# Patient Record
Sex: Female | Born: 1955 | Race: White | Hispanic: No | Marital: Married | Smoking: Current every day smoker
Health system: Southern US, Community
[De-identification: ages and names within clinical notes are randomized; demographics above are authoritative.]

## PROBLEM LIST (undated history)

## (undated) DIAGNOSIS — Z960 Presence of urogenital implants: Secondary | ICD-10-CM

## (undated) DIAGNOSIS — R0602 Shortness of breath: Secondary | ICD-10-CM

## (undated) DIAGNOSIS — J449 Chronic obstructive pulmonary disease, unspecified: Secondary | ICD-10-CM

## (undated) DIAGNOSIS — R0609 Other forms of dyspnea: Secondary | ICD-10-CM

## (undated) DIAGNOSIS — I10 Essential (primary) hypertension: Secondary | ICD-10-CM

## (undated) DIAGNOSIS — Z720 Tobacco use: Secondary | ICD-10-CM

## (undated) DIAGNOSIS — R0600 Dyspnea, unspecified: Secondary | ICD-10-CM

## (undated) DIAGNOSIS — R079 Chest pain, unspecified: Secondary | ICD-10-CM

## (undated) DIAGNOSIS — E119 Type 2 diabetes mellitus without complications: Secondary | ICD-10-CM

## (undated) HISTORY — DX: Tobacco use: Z72.0

## (undated) HISTORY — DX: Type 2 diabetes mellitus without complications: E11.9

## (undated) HISTORY — DX: Other forms of dyspnea: R06.09

## (undated) HISTORY — DX: Chest pain, unspecified: R07.9

## (undated) HISTORY — DX: Dyspnea, unspecified: R06.00

## (undated) HISTORY — DX: Shortness of breath: R06.02

---

## 1985-10-18 HISTORY — PX: PARTIAL HYSTERECTOMY: SHX80

## 1987-10-19 HISTORY — PX: TOTAL ABDOMINAL HYSTERECTOMY: SHX209

## 1999-09-08 ENCOUNTER — Encounter: Admission: RE | Admit: 1999-09-08 | Discharge: 1999-09-21 | Payer: Self-pay | Admitting: Family Medicine

## 2003-04-05 ENCOUNTER — Other Ambulatory Visit: Admission: RE | Admit: 2003-04-05 | Discharge: 2003-04-05 | Payer: Self-pay | Admitting: Family Medicine

## 2004-01-13 ENCOUNTER — Ambulatory Visit (HOSPITAL_COMMUNITY): Admission: RE | Admit: 2004-01-13 | Discharge: 2004-01-13 | Payer: Self-pay | Admitting: Gastroenterology

## 2004-01-13 ENCOUNTER — Encounter (INDEPENDENT_AMBULATORY_CARE_PROVIDER_SITE_OTHER): Payer: Self-pay | Admitting: *Deleted

## 2006-02-01 ENCOUNTER — Other Ambulatory Visit: Admission: RE | Admit: 2006-02-01 | Discharge: 2006-02-01 | Payer: Self-pay | Admitting: Obstetrics and Gynecology

## 2008-03-23 ENCOUNTER — Emergency Department (HOSPITAL_COMMUNITY): Admission: EM | Admit: 2008-03-23 | Discharge: 2008-03-23 | Payer: Self-pay | Admitting: Emergency Medicine

## 2011-10-20 ENCOUNTER — Emergency Department (HOSPITAL_COMMUNITY)
Admission: EM | Admit: 2011-10-20 | Discharge: 2011-10-20 | Disposition: A | Discharge status: Home / Self Care | Payer: BC Managed Care – PPO | Attending: Emergency Medicine | Admitting: Emergency Medicine

## 2011-10-20 ENCOUNTER — Encounter: Payer: Self-pay | Admitting: Emergency Medicine

## 2011-10-20 ENCOUNTER — Emergency Department (HOSPITAL_COMMUNITY): Payer: BC Managed Care – PPO

## 2011-10-20 ENCOUNTER — Other Ambulatory Visit: Payer: Self-pay

## 2011-10-20 DIAGNOSIS — R059 Cough, unspecified: Secondary | ICD-10-CM | POA: Insufficient documentation

## 2011-10-20 DIAGNOSIS — J069 Acute upper respiratory infection, unspecified: Secondary | ICD-10-CM | POA: Insufficient documentation

## 2011-10-20 DIAGNOSIS — R0602 Shortness of breath: Secondary | ICD-10-CM | POA: Insufficient documentation

## 2011-10-20 DIAGNOSIS — R05 Cough: Secondary | ICD-10-CM | POA: Insufficient documentation

## 2011-10-20 DIAGNOSIS — R413 Other amnesia: Secondary | ICD-10-CM

## 2011-10-20 DIAGNOSIS — R42 Dizziness and giddiness: Secondary | ICD-10-CM | POA: Insufficient documentation

## 2011-10-20 HISTORY — DX: Essential (primary) hypertension: I10

## 2011-10-20 HISTORY — DX: Chronic obstructive pulmonary disease, unspecified: J44.9

## 2011-10-20 HISTORY — DX: Presence of urogenital implants: Z96.0

## 2011-10-20 LAB — COMPREHENSIVE METABOLIC PANEL
Alkaline Phosphatase: 97 U/L (ref 39–117)
BUN: 8 mg/dL (ref 6–23)
CO2: 25 mEq/L (ref 19–32)
Chloride: 100 mEq/L (ref 96–112)
Creatinine, Ser: 0.51 mg/dL (ref 0.50–1.10)
GFR calc non Af Amer: >90 mL/min (ref >90)
Potassium: 4.3 mEq/L (ref 3.5–5.1)
Total Bilirubin: 0.1 mg/dL — ABNORMAL LOW (ref 0.3–1.2)

## 2011-10-20 LAB — URINALYSIS, ROUTINE W REFLEX MICROSCOPIC
Glucose, UA: NEGATIVE mg/dL
Hgb urine dipstick: NEGATIVE
Ketones, ur: NEGATIVE mg/dL
Protein, ur: NEGATIVE mg/dL
Urobilinogen, UA: 0.2 mg/dL (ref 0.0–1.0)

## 2011-10-20 LAB — CBC
HCT: 40.2 % (ref 36.0–46.0)
Hemoglobin: 13.8 g/dL (ref 12.0–15.0)
MCV: 90.1 fL (ref 78.0–100.0)
RBC: 4.46 MIL/uL (ref 3.87–5.11)
WBC: 11.8 10*3/uL — ABNORMAL HIGH (ref 4.0–10.5)

## 2011-10-20 LAB — DIFFERENTIAL
Basophils Relative: 0 % (ref 0–1)
Eosinophils Relative: 1 % (ref 0–5)
Lymphocytes Relative: 29 % (ref 12–46)
Lymphs Abs: 3.4 10*3/uL (ref 0.7–4.0)
Monocytes Relative: 1 % — ABNORMAL LOW (ref 3–12)
Neutro Abs: 8.2 10*3/uL — ABNORMAL HIGH (ref 1.7–7.7)

## 2011-10-20 MED ORDER — AZITHROMYCIN 250 MG PO TABS: 250.0000 mg | ORAL_TABLET | Freq: Every day | ORAL | Status: AC

## 2011-10-20 MED ORDER — ONDANSETRON HCL 4 MG/2ML IJ SOLN
4.0000 mg | Freq: Once | INTRAMUSCULAR | Status: AC
  Administered 2011-10-20: 4 mg via INTRAVENOUS
  Filled 2011-10-20: qty 2

## 2011-10-20 MED ORDER — IOHEXOL 300 MG/ML  SOLN: 100.0000 mL | Freq: Once | INTRAMUSCULAR | Status: DC | PRN

## 2011-10-20 MED ORDER — IPRATROPIUM BROMIDE 0.02 % IN SOLN
0.5000 mg | RESPIRATORY_TRACT | Status: DC
  Administered 2011-10-20: 0.5 mg via RESPIRATORY_TRACT
  Filled 2011-10-20: qty 2.5

## 2011-10-20 MED ORDER — ALBUTEROL SULFATE (5 MG/ML) 0.5% IN NEBU
2.5000 mg | INHALATION_SOLUTION | RESPIRATORY_TRACT | Status: DC
  Administered 2011-10-20: 2.5 mg via RESPIRATORY_TRACT
  Filled 2011-10-20: qty 0.5

## 2011-10-20 NOTE — ED Notes (Signed)
Per ems, the patient starting having signs of flu on christmas day ( cough, congestion, headache, nausea)

## 2011-10-20 NOTE — ED Notes (Signed)
[id number]<JY> Expected date:<BR> Expected time:<BR> Means of arrival:<BR> Comments:<BR> EMS/flu like symptoms

## 2011-10-20 NOTE — ED Notes (Signed)
Pt given discharge info and rx

## 2011-10-20 NOTE — ED Notes (Signed)
cbg 174,   zofran 4mg  ivp,   20 Right forearm.

## 2011-10-20 NOTE — ED Provider Notes (Signed)
History     CSN: [id number]  Arrival date & time 10/20/11  4098   First MD Initiated Contact with Patient 10/20/11 0703      Chief Complaint  Patient presents with  . Influenza    (Consider location/radiation/quality/duration/timing/severity/associated sxs/prior treatment) HPI Comments: Patient tells me she has sick with the above complaints for the past 1 1/2 weeks.  This morning she went to work.  After arriving there, she had no idea how she had gotten there, and had no recollection of waking up and getting ready this morning.  911 was called and the patient brought here.  She denies any headache, weakness, but does tell me at one point this morning her legs felt numb.    Per her husband, she gets "plum stupid" when she has a fever.    Patient is a 56 y.o. female presenting with flu symptoms. The history is provided by the patient.  Influenza This is a new problem. The current episode started more than 1 week ago. The problem occurs constantly. The problem has been gradually worsening. Pertinent negatives include no chest pain, no headaches and no shortness of breath. The symptoms are aggravated by nothing. The symptoms are relieved by nothing. Treatments tried: cough syrup.    Past Medical History  Diagnosis Date  . Ureteral stent retained   . Hypertension   . Diabetes mellitus   . COPD (chronic obstructive pulmonary disease)     History reviewed. No pertinent past surgical history.  History reviewed. No pertinent family history.  History  Substance Use Topics  . Smoking status: Current Everyday Smoker  . Smokeless tobacco: Not on file  . Alcohol Use: No    OB History    Grav Para Term Preterm Abortions TAB SAB Ect Mult Living                  Review of Systems  Respiratory: Negative for shortness of breath.   Cardiovascular: Negative for chest pain.  Neurological: Negative for headaches.  All other systems reviewed and are negative.    Allergies  Codeine;  Penicillins; and Sulfa antibiotics  Home Medications   Current Outpatient Rx  Name Route Sig Dispense Refill  . ESTROGENS CONJUGATED 1.25 MG PO TABS Oral Take 1.25 mg by mouth daily.        BP 188/88  Pulse 67  Temp(Src) 97.7 F (36.5 C) (Oral)  Resp 18  SpO2 96%  Physical Exam  Nursing note and vitals reviewed. Constitutional: She is oriented to person, place, and time. She appears well-developed and well-nourished. No distress.  HENT:  Head: Normocephalic and atraumatic.  Neck: Normal range of motion. Neck supple.  Cardiovascular: Normal rate and regular rhythm.  Exam reveals no gallop and no friction rub.   No murmur heard. Pulmonary/Chest: Effort normal and breath sounds normal. No respiratory distress. She has no wheezes.  Abdominal: Soft. Bowel sounds are normal. She exhibits no distension. There is no tenderness.  Musculoskeletal: Normal range of motion.  Neurological: She is alert and oriented to person, place, and time.  Skin: Skin is warm and dry. She is not diaphoretic.    ED Course  Procedures (including critical care time)   Labs Reviewed  CBC  DIFFERENTIAL  COMPREHENSIVE METABOLIC PANEL  URINALYSIS, ROUTINE W REFLEX MICROSCOPIC   No results found.   No diagnosis found.    MDM  The patient complained of memory loss.  Per husband, she behaves strangely when she is ill and has also  been taking a lot of cough syrup.  There is nothing turning up on the workup and will discharge with an antibiotic as she has been ill for one week.          Geoffery Lyons, MD 10/20/11 [phone]

## 2011-10-20 NOTE — ED Notes (Signed)
Respiratory paged for duoneb order.  Therapist stated she was unable to do it at this time.

## 2011-10-20 NOTE — ED Notes (Signed)
Patient transported to X-ray 

## 2011-10-20 NOTE — ED Notes (Signed)
Pt states nausea has improved.

## 2012-08-31 ENCOUNTER — Other Ambulatory Visit (HOSPITAL_COMMUNITY): Payer: Self-pay | Admitting: Cardiovascular Disease

## 2012-08-31 DIAGNOSIS — I10 Essential (primary) hypertension: Secondary | ICD-10-CM

## 2012-09-01 ENCOUNTER — Other Ambulatory Visit: Payer: Self-pay | Admitting: Internal Medicine

## 2012-10-02 ENCOUNTER — Inpatient Hospital Stay (HOSPITAL_COMMUNITY): Admission: RE | Admit: 2012-10-02 | Payer: BC Managed Care – PPO | Source: Ambulatory Visit

## 2012-10-22 ENCOUNTER — Inpatient Hospital Stay (HOSPITAL_BASED_OUTPATIENT_CLINIC_OR_DEPARTMENT_OTHER)
Admission: EM | Admit: 2012-10-22 | Discharge: 2012-10-24 | DRG: 541 | Disposition: A | Discharge status: Home / Self Care | Payer: BC Managed Care – PPO | Attending: Internal Medicine | Admitting: Internal Medicine

## 2012-10-22 ENCOUNTER — Emergency Department (HOSPITAL_BASED_OUTPATIENT_CLINIC_OR_DEPARTMENT_OTHER): Payer: BC Managed Care – PPO

## 2012-10-22 ENCOUNTER — Encounter (HOSPITAL_BASED_OUTPATIENT_CLINIC_OR_DEPARTMENT_OTHER): Payer: Self-pay | Admitting: *Deleted

## 2012-10-22 DIAGNOSIS — I10 Essential (primary) hypertension: Secondary | ICD-10-CM

## 2012-10-22 DIAGNOSIS — J441 Chronic obstructive pulmonary disease with (acute) exacerbation: Principal | ICD-10-CM

## 2012-10-22 DIAGNOSIS — F17200 Nicotine dependence, unspecified, uncomplicated: Secondary | ICD-10-CM | POA: Diagnosis present

## 2012-10-22 DIAGNOSIS — E876 Hypokalemia: Secondary | ICD-10-CM

## 2012-10-22 DIAGNOSIS — Z23 Encounter for immunization: Secondary | ICD-10-CM

## 2012-10-22 DIAGNOSIS — Z720 Tobacco use: Secondary | ICD-10-CM | POA: Diagnosis present

## 2012-10-22 DIAGNOSIS — Z79899 Other long term (current) drug therapy: Secondary | ICD-10-CM

## 2012-10-22 DIAGNOSIS — Z885 Allergy status to narcotic agent status: Secondary | ICD-10-CM

## 2012-10-22 DIAGNOSIS — J449 Chronic obstructive pulmonary disease, unspecified: Secondary | ICD-10-CM

## 2012-10-22 DIAGNOSIS — Z882 Allergy status to sulfonamides status: Secondary | ICD-10-CM

## 2012-10-22 DIAGNOSIS — J9600 Acute respiratory failure, unspecified whether with hypoxia or hypercapnia: Secondary | ICD-10-CM | POA: Diagnosis present

## 2012-10-22 DIAGNOSIS — E119 Type 2 diabetes mellitus without complications: Secondary | ICD-10-CM | POA: Diagnosis present

## 2012-10-22 DIAGNOSIS — Z880 Allergy status to penicillin: Secondary | ICD-10-CM

## 2012-10-22 DIAGNOSIS — E785 Hyperlipidemia, unspecified: Secondary | ICD-10-CM

## 2012-10-22 LAB — CBC WITH DIFFERENTIAL/PLATELET
Basophils Absolute: 0 10*3/uL (ref 0.0–0.1)
Basophils Relative: 0 % (ref 0–1)
Eosinophils Absolute: 0.1 10*3/uL (ref 0.0–0.7)
Eosinophils Relative: 1 % (ref 0–5)
HCT: 44.3 % (ref 36.0–46.0)
Lymphocytes Relative: 50 % — ABNORMAL HIGH (ref 12–46)
MCHC: 33.9 g/dL (ref 30.0–36.0)
MCV: 91.3 fL (ref 78.0–100.0)
Monocytes Absolute: 0.5 10*3/uL (ref 0.1–1.0)
Platelets: 301 10*3/uL (ref 150–400)
RDW: 13.2 % (ref 11.5–15.5)
WBC: 8.6 10*3/uL (ref 4.0–10.5)

## 2012-10-22 LAB — BASIC METABOLIC PANEL
BUN: 10 mg/dL (ref 6–23)
Calcium: 9.2 mg/dL (ref 8.4–10.5)
Creatinine, Ser: 0.6 mg/dL (ref 0.50–1.10)
GFR calc Af Amer: >90 mL/min (ref >90)
GFR calc non Af Amer: >90 mL/min (ref >90)

## 2012-10-22 LAB — INFLUENZA PANEL BY PCR (TYPE A & B): Influenza A By PCR: NEGATIVE

## 2012-10-22 MED ORDER — ENOXAPARIN SODIUM 40 MG/0.4ML ~~LOC~~ SOLN
40.0000 mg | SUBCUTANEOUS | Status: DC
  Administered 2012-10-22 – 2012-10-23 (×2): 40 mg via SUBCUTANEOUS
  Filled 2012-10-22 (×3): qty 0.4

## 2012-10-22 MED ORDER — LISINOPRIL 5 MG PO TABS
5.0000 mg | ORAL_TABLET | Freq: Every day | ORAL | Status: DC
  Administered 2012-10-22 – 2012-10-24 (×3): 5 mg via ORAL
  Filled 2012-10-22 (×3): qty 1

## 2012-10-22 MED ORDER — GUAIFENESIN ER 600 MG PO TB12
600.0000 mg | ORAL_TABLET | Freq: Two times a day (BID) | ORAL | Status: DC
  Administered 2012-10-22 – 2012-10-24 (×5): 600 mg via ORAL
  Filled 2012-10-22 (×6): qty 1

## 2012-10-22 MED ORDER — PREDNISONE 50 MG PO TABS
60.0000 mg | ORAL_TABLET | Freq: Once | ORAL | Status: AC
  Administered 2012-10-22: 60 mg via ORAL
  Filled 2012-10-22: qty 1

## 2012-10-22 MED ORDER — ALBUTEROL SULFATE (5 MG/ML) 0.5% IN NEBU
5.0000 mg | INHALATION_SOLUTION | Freq: Once | RESPIRATORY_TRACT | Status: AC
  Administered 2012-10-22: 5 mg via RESPIRATORY_TRACT
  Filled 2012-10-22: qty 0.5

## 2012-10-22 MED ORDER — ALBUTEROL SULFATE (5 MG/ML) 0.5% IN NEBU: 2.5000 mg | INHALATION_SOLUTION | RESPIRATORY_TRACT | Status: DC

## 2012-10-22 MED ORDER — IBUPROFEN 200 MG PO TABS
200.0000 mg | ORAL_TABLET | Freq: Four times a day (QID) | ORAL | Status: DC | PRN
  Filled 2012-10-22: qty 1

## 2012-10-22 MED ORDER — GUAIFENESIN-DM 100-10 MG/5ML PO SYRP
5.0000 mL | ORAL_SOLUTION | ORAL | Status: DC | PRN
  Filled 2012-10-22: qty 5

## 2012-10-22 MED ORDER — POTASSIUM CHLORIDE CRYS ER 20 MEQ PO TBCR
20.0000 meq | EXTENDED_RELEASE_TABLET | Freq: Once | ORAL | Status: AC
  Administered 2012-10-22: 20 meq via ORAL
  Filled 2012-10-22: qty 1

## 2012-10-22 MED ORDER — SODIUM CHLORIDE 0.9 % IV SOLN: INTRAVENOUS | Status: DC

## 2012-10-22 MED ORDER — INSULIN ASPART 100 UNIT/ML ~~LOC~~ SOLN
0.0000 [IU] | Freq: Three times a day (TID) | SUBCUTANEOUS | Status: DC
  Administered 2012-10-23 (×2): 2 [IU] via SUBCUTANEOUS
  Administered 2012-10-23: 3 [IU] via SUBCUTANEOUS

## 2012-10-22 MED ORDER — NICOTINE 21 MG/24HR TD PT24
21.0000 mg | MEDICATED_PATCH | Freq: Every day | TRANSDERMAL | Status: DC
  Administered 2012-10-22 – 2012-10-24 (×3): 21 mg via TRANSDERMAL
  Filled 2012-10-22 (×3): qty 1

## 2012-10-22 MED ORDER — SODIUM CHLORIDE 0.9 % IV BOLUS (SEPSIS)
500.0000 mL | Freq: Once | INTRAVENOUS | Status: AC
  Administered 2012-10-22: 500 mL via INTRAVENOUS

## 2012-10-22 MED ORDER — INSULIN ASPART 100 UNIT/ML ~~LOC~~ SOLN
0.0000 [IU] | Freq: Every day | SUBCUTANEOUS | Status: DC
  Administered 2012-10-22: 4 [IU] via SUBCUTANEOUS
  Administered 2012-10-23: 2 [IU] via SUBCUTANEOUS

## 2012-10-22 MED ORDER — PREDNISONE 20 MG PO TABS
40.0000 mg | ORAL_TABLET | Freq: Every day | ORAL | Status: DC
  Administered 2012-10-23 – 2012-10-24 (×2): 40 mg via ORAL
  Filled 2012-10-22 (×3): qty 2

## 2012-10-22 MED ORDER — IPRATROPIUM BROMIDE 0.02 % IN SOLN
RESPIRATORY_TRACT | Status: AC
  Filled 2012-10-22: qty 2.5

## 2012-10-22 MED ORDER — ATORVASTATIN CALCIUM 20 MG PO TABS
20.0000 mg | ORAL_TABLET | Freq: Every day | ORAL | Status: DC
  Administered 2012-10-22 – 2012-10-24 (×3): 20 mg via ORAL
  Filled 2012-10-22 (×4): qty 1

## 2012-10-22 MED ORDER — IPRATROPIUM BROMIDE 0.02 % IN SOLN
0.5000 mg | Freq: Four times a day (QID) | RESPIRATORY_TRACT | Status: DC
  Administered 2012-10-22 – 2012-10-24 (×8): 0.5 mg via RESPIRATORY_TRACT
  Filled 2012-10-22 (×8): qty 2.5

## 2012-10-22 MED ORDER — LEVOFLOXACIN 500 MG PO TABS
500.0000 mg | ORAL_TABLET | Freq: Once | ORAL | Status: AC
  Administered 2012-10-22: 500 mg via ORAL
  Filled 2012-10-22: qty 1

## 2012-10-22 MED ORDER — ALBUTEROL SULFATE (5 MG/ML) 0.5% IN NEBU
INHALATION_SOLUTION | RESPIRATORY_TRACT | Status: AC
  Administered 2012-10-22: 5 mg via RESPIRATORY_TRACT
  Filled 2012-10-22: qty 1

## 2012-10-22 MED ORDER — IPRATROPIUM BROMIDE 0.02 % IN SOLN
0.5000 mg | RESPIRATORY_TRACT | Status: DC
  Administered 2012-10-22: 0.5 mg via RESPIRATORY_TRACT

## 2012-10-22 MED ORDER — ALBUTEROL SULFATE (5 MG/ML) 0.5% IN NEBU
2.5000 mg | INHALATION_SOLUTION | Freq: Four times a day (QID) | RESPIRATORY_TRACT | Status: DC
  Administered 2012-10-22 – 2012-10-24 (×8): 2.5 mg via RESPIRATORY_TRACT
  Filled 2012-10-22 (×8): qty 0.5

## 2012-10-22 MED ORDER — SODIUM CHLORIDE 0.9 % IV SOLN
INTRAVENOUS | Status: AC
  Administered 2012-10-22: 16:00:00 via INTRAVENOUS

## 2012-10-22 MED ORDER — ACETAMINOPHEN 650 MG RE SUPP: 650.0000 mg | Freq: Four times a day (QID) | RECTAL | Status: DC | PRN

## 2012-10-22 MED ORDER — OXYCODONE-ACETAMINOPHEN 5-325 MG PO TABS
2.0000 | ORAL_TABLET | Freq: Once | ORAL | Status: AC
  Administered 2012-10-22: 2 via ORAL
  Filled 2012-10-22 (×2): qty 2

## 2012-10-22 MED ORDER — ONDANSETRON HCL 4 MG/2ML IJ SOLN: 4.0000 mg | Freq: Four times a day (QID) | INTRAMUSCULAR | Status: DC | PRN

## 2012-10-22 MED ORDER — PNEUMOCOCCAL VAC POLYVALENT 25 MCG/0.5ML IJ INJ
0.5000 mL | INJECTION | INTRAMUSCULAR | Status: AC
  Administered 2012-10-24: 0.5 mL via INTRAMUSCULAR
  Filled 2012-10-22: qty 0.5

## 2012-10-22 MED ORDER — ONDANSETRON HCL 4 MG PO TABS: 4.0000 mg | ORAL_TABLET | Freq: Four times a day (QID) | ORAL | Status: DC | PRN

## 2012-10-22 MED ORDER — ALBUTEROL SULFATE (5 MG/ML) 0.5% IN NEBU: 2.5000 mg | INHALATION_SOLUTION | RESPIRATORY_TRACT | Status: DC | PRN

## 2012-10-22 MED ORDER — ESTROGENS CONJUGATED 1.25 MG PO TABS
1.2500 mg | ORAL_TABLET | Freq: Every day | ORAL | Status: DC
  Administered 2012-10-22 – 2012-10-24 (×3): 1.25 mg via ORAL
  Filled 2012-10-22 (×4): qty 1

## 2012-10-22 MED ORDER — LEVOFLOXACIN 750 MG PO TABS
750.0000 mg | ORAL_TABLET | Freq: Every day | ORAL | Status: DC
  Administered 2012-10-23 – 2012-10-24 (×2): 750 mg via ORAL
  Filled 2012-10-22 (×2): qty 1

## 2012-10-22 MED ORDER — ALBUTEROL SULFATE (5 MG/ML) 0.5% IN NEBU
2.5000 mg | INHALATION_SOLUTION | RESPIRATORY_TRACT | Status: DC
  Administered 2012-10-22: 5 mg via RESPIRATORY_TRACT

## 2012-10-22 MED ORDER — HYDROCODONE-ACETAMINOPHEN 5-325 MG PO TABS
0.5000 | ORAL_TABLET | Freq: Four times a day (QID) | ORAL | Status: DC | PRN
  Administered 2012-10-22 – 2012-10-24 (×5): 1 via ORAL
  Filled 2012-10-22 (×5): qty 1

## 2012-10-22 MED ORDER — ACETAMINOPHEN 325 MG PO TABS
650.0000 mg | ORAL_TABLET | Freq: Four times a day (QID) | ORAL | Status: DC | PRN
  Administered 2012-10-22: 650 mg via ORAL
  Filled 2012-10-22: qty 2

## 2012-10-22 NOTE — ED Notes (Signed)
EKG was done and shown to Dr. Fredderick Phenix. An old EKG was pulled from the [facility] system and also given to the doctor.

## 2012-10-22 NOTE — H&P (Addendum)
Patient's PCP: [facility]  Chief Complaint: Shortness of breath  History of Present Illness: Patricia Kaiser is a 57 y.o. Caucasian female with history of hypertension, diabetes? not on any diabetic medications, and COPD who presents with the above complaints.  Patient reported that on 10/15/2012 she had developed sinus congestion and fever.  She had presented to her primary care physician's office on 10/16/2012 for further evaluation and was given cough medications.  Since then her symptoms have worsened and she has been feeling increasingly short of breath with increasing wheezing.  She has also been having nauseated with dry heaving.  Her shortness of breath worsened since 10/18/2012.  She has been using inhaler and nebulizer without any relief.  As a result she presented to the emergency department for further evaluation.  She was given prednisone, neb treatments, and levofloxacin and the hospitalist service was asked to admit the patient for further care and management.  Patient does admit to having some fevers at home.  Complaining of some sinus congestion and drainage.  Denies any abdominal pain, but had diarrhea on 10/17/2012 and 10/18/2012 which since then she has not had any episode.  Has been having headaches due to lack of sleep.  Denies any vision changes.  Review of Systems: All systems reviewed with the patient and positive as per history of present illness, otherwise all other systems are negative.  Past Medical History  Diagnosis Date  . Ureteral stent retained   . Hypertension   . Diabetes mellitus   . COPD (chronic obstructive pulmonary disease)    History reviewed. No pertinent past surgical history. Family History  Problem Relation Age of Onset  . COPD Mother   . Lung cancer Mother   . COPD Father    History   Social History  . Marital Status: Divorced    Spouse Name: N/A    Number of Children: N/A  . Years of Education: N/A   Occupational  History  . Not on file.   Social History Main Topics  . Smoking status: Current Every Day Smoker  . Smokeless tobacco: Not on file  . Alcohol Use: No  . Drug Use: No  . Sexually Active: Yes   Other Topics Concern  . Not on file   Social History Narrative  . No narrative on file   Allergies: Codeine; Penicillins; and Sulfa antibiotics  Home Meds: Prior to Admission medications   Medication Sig Start Date End Date Taking? Authorizing Provider  albuterol (PROVENTIL HFA;VENTOLIN HFA) 108 (90 BASE) MCG/ACT inhaler Inhale 2 puffs into the lungs every 6 (six) hours as needed. For difficulty breathing   Yes Historical Provider, MD  atorvastatin (LIPITOR) 20 MG tablet Take 20 mg by mouth daily.  09/16/12  Yes Historical Provider, MD  co-enzyme Q-10 30 MG capsule Take 30 mg by mouth at bedtime.   Yes Historical Provider, MD  estrogens, conjugated, (PREMARIN) 1.25 MG tablet Take 1.25 mg by mouth daily.     Yes Historical Provider, MD  lisinopril (PRINIVIL,ZESTRIL) 5 MG tablet Take 5 mg by mouth daily.  09/16/12  Yes Historical Provider, MD    Physical Exam: Blood pressure 145/71, pulse 90, temperature 98.9 F (37.2 C), temperature source Oral, resp. rate 20, weight 53.071 kg (117 lb), SpO2 94.00%. General: Awake, Oriented x3, No acute distress. HEENT: EOMI, Moist mucous membranes Neck: Supple CV: S1 and S2 Lungs: Expiratory greater than inspiratory wheezing bilaterally, moderate air movement. Abdomen: Soft, Nontender, Nondistended, +bowel sounds. Ext: Good pulses.  Trace edema. No clubbing or cyanosis noted. Neuro: Cranial Nerves II-XII grossly intact. Has 5/5 motor strength in upper and lower extremities.  Lab results:  Basename 10/22/12 1030  NA 140  K 3.4*  CL 98  CO2 27  GLUCOSE 167*  BUN 10  CREATININE 0.60  CALCIUM 9.2  MG --  PHOS --   No results found for this basename: AST:2,ALT:2,ALKPHOS:2,BILITOT:2,PROT:2,ALBUMIN:2 in the last 72 hours No results found for this  basename: LIPASE:2,AMYLASE:2 in the last 72 hours  Basename 10/22/12 1030  WBC 8.6  NEUTROABS 3.7  HGB 15.0  HCT 44.3  MCV 91.3  PLT 301   No results found for this basename: CKTOTAL:3,CKMB:3,CKMBINDEX:3,TROPONINI:3 in the last 72 hours No components found with this basename: POCBNP:3 No results found for this basename: DDIMER in the last 72 hours No results found for this basename: HGBA1C:2 in the last 72 hours No results found for this basename: CHOL:2,HDL:2,LDLCALC:2,TRIG:2,CHOLHDL:2,LDLDIRECT:2 in the last 72 hours No results found for this basename: TSH,T4TOTAL,FREET3,T3FREE,THYROIDAB in the last 72 hours No results found for this basename: VITAMINB12:2,FOLATE:2,FERRITIN:2,TIBC:2,IRON:2,RETICCTPCT:2 in the last 72 hours Imaging results:  Dg Chest 2 View  10/22/2012  *RADIOLOGY REPORT*  Clinical Data: Wheezing with shortness of breath and cough.  CHEST - 2 VIEW  Comparison: 10/20/2011  Findings: Two views of the chest demonstrate clear lungs. Heart and mediastinum are within normal limits.  The trachea is midline. Bony thorax is intact. There is a stable small calcification at the right lung base.  IMPRESSION: Stable chest radiograph findings.  No acute cardiopulmonary disease.   Original Report Authenticated By: Richarda Overlie, M.D.    Other results: EKG: Normal sinus rhythm with heart rate of 78.  Assessment & Plan by Problem: Acute respiratory failure due to COPD exacerbation Suspect patient had upper viral respiratory infection which likely triggered her COPD exacerbation.  Continue prednisone 40 mg daily.  Continue levofloxacin for 4 more days to complete a five-day course.  Check influenza PCR, patient out of therapeutic window for Tamiflu, if patient's respiratory status worsens consider starting Tamiflu.  Continue neb treatments.  Tobacco use Patient counseled on cessation.  Nicotine patch prescribed.  Patient reports that she is motivated to quit smoking.  Hypertension Continue  home lisinopril.  Stable.  Hypokalemia Replace as needed.  Hyperlipidemia Continue statin.  Diabetes? Not on any diabetic medications.  Sensitive sliding scale insulin.  Check hemoglobin A1C.  Nausea and vomiting Likely due to recent upper respiratory viral infection.  Improved.  Continue to monitor.  Prophylaxis Lovenox.  CODE STATUS Full code.  Disposition Admit the patient as observation to med-surg.  Time spent on admission, talking to the patient, and coordinating care was: 60 mins.  Obdulio Mash A, MD 10/22/2012, 3:41 PM

## 2012-10-22 NOTE — ED Notes (Signed)
Patient states she has been running a fever, non-productive cough over the past week. She went to [facility] on Monday and given prescription for cough syrup.  Used albuterol inhaler last night

## 2012-10-22 NOTE — ED Provider Notes (Signed)
History     CSN: [id number]  Arrival date & time 10/22/12  0865   First MD Initiated Contact with Patient 10/22/12 [phone]      Chief Complaint  Patient presents with  . Shortness of Breath    (Consider location/radiation/quality/duration/timing/severity/associated sxs/prior treatment) HPI Comments: Patient presents with cough and shortness of breath. She has a history of COPD but does not regularly use an inhaler. Over the last week she's been having some coughing and cold symptoms. She's had low-grade fevers and a cough which is mostly nonproductive. She sore across her chest from coughing she says. She's had some increased shortness of breath and wheezing. She was seen by her primary care physician 6 days ago and was given prescription for some cough medicine and was told that she had upper respiratory infection. She's been using her albuterol inhaler at home and she also has a nebulizer machine which she uses once however she says the medicine she had port was old. She denies any recent vomiting or diarrhea. She states her cough and shortness of breath has gotten worse over the last week.  Patient is a 57 y.o. female presenting with shortness of breath.  Shortness of Breath  Associated symptoms include a fever, shortness of breath and wheezing. Pertinent negatives include no chest pain, no rhinorrhea and no cough.    Past Medical History  Diagnosis Date  . Ureteral stent retained   . Hypertension   . Diabetes mellitus   . COPD (chronic obstructive pulmonary disease)     No past surgical history on file.  No family history on file.  History  Substance Use Topics  . Smoking status: Current Every Day Smoker  . Smokeless tobacco: Not on file  . Alcohol Use: No    OB History    Grav Para Term Preterm Abortions TAB SAB Ect Mult Living                  Review of Systems  Constitutional: Positive for fever and fatigue. Negative for chills and diaphoresis.  HENT: Positive for  congestion. Negative for rhinorrhea and sneezing.   Eyes: Negative.   Respiratory: Positive for shortness of breath and wheezing. Negative for cough and chest tightness.   Cardiovascular: Negative for chest pain and leg swelling.  Gastrointestinal: Negative for nausea, vomiting, abdominal pain, diarrhea and blood in stool.  Genitourinary: Negative for frequency, hematuria, flank pain and difficulty urinating.  Musculoskeletal: Positive for myalgias. Negative for back pain and arthralgias.  Skin: Negative for rash.  Neurological: Negative for dizziness, speech difficulty, weakness, numbness and headaches.    Allergies  Codeine; Penicillins; and Sulfa antibiotics  Home Medications   Current Outpatient Rx  Name  Route  Sig  Dispense  Refill  . ALBUTEROL IN   Inhalation   Inhale into the lungs.         . ESTROGENS CONJUGATED 1.25 MG PO TABS   Oral   Take 1.25 mg by mouth daily.             BP 149/81  Pulse 84  Temp 98.4 F (36.9 C)  Resp 21  SpO2 91%  Physical Exam  Constitutional: She is oriented to person, place, and time. She appears well-developed and well-nourished.  HENT:  Head: Normocephalic and atraumatic.  Right Ear: External ear normal.  Left Ear: External ear normal.  Mouth/Throat: Oropharynx is clear and moist.  Eyes: Pupils are equal, round, and reactive to light.  Neck: Normal range of motion.  Neck supple.  Cardiovascular: Normal rate, regular rhythm and normal heart sounds.   Pulmonary/Chest: Effort normal. No respiratory distress. She has wheezes (bilateral expiratory wheezing. There is rhonchi bilaterally and crackles in the bases bilaterally). She has rales. She exhibits no tenderness.       She had some mild increased work of breathing and tachypnea.  Abdominal: Soft. Bowel sounds are normal. There is no tenderness. There is no rebound and no guarding.  Musculoskeletal: Normal range of motion. She exhibits no edema.  Lymphadenopathy:    She has no  cervical adenopathy.  Neurological: She is alert and oriented to person, place, and time.  Skin: Skin is warm and dry. No rash noted.  Psychiatric: She has a normal mood and affect.    ED Course  Procedures (including critical care time)  Results for orders placed during the hospital encounter of 10/22/12  CBC WITH DIFFERENTIAL      Component Value Range   WBC 8.6  4.0 - 10.5 K/uL   RBC 4.85  3.87 - 5.11 MIL/uL   Hemoglobin 15.0  12.0 - 15.0 g/dL   HCT 45.4  09.8 - 11.9 %   MCV 91.3  78.0 - 100.0 fL   MCH 30.9  26.0 - 34.0 pg   MCHC 33.9  30.0 - 36.0 g/dL   RDW 14.7  82.9 - 56.2 %   Platelets 301  150 - 400 K/uL   Neutrophils Relative 42 (*) 43 - 77 %   Neutro Abs 3.7  1.7 - 7.7 K/uL   Lymphocytes Relative 50 (*) 12 - 46 %   Lymphs Abs 4.3 (*) 0.7 - 4.0 K/uL   Monocytes Relative 6  3 - 12 %   Monocytes Absolute 0.5  0.1 - 1.0 K/uL   Eosinophils Relative 1  0 - 5 %   Eosinophils Absolute 0.1  0.0 - 0.7 K/uL   Basophils Relative 0  0 - 1 %   Basophils Absolute 0.0  0.0 - 0.1 K/uL   WBC Morphology ATYPICAL LYMPHOCYTES    BASIC METABOLIC PANEL      Component Value Range   Sodium 140  135 - 145 mEq/L   Potassium 3.4 (*) 3.5 - 5.1 mEq/L   Chloride 98  96 - 112 mEq/L   CO2 27  19 - 32 mEq/L   Glucose, Bld 167 (*) 70 - 99 mg/dL   BUN 10  6 - 23 mg/dL   Creatinine, Ser 1.30  0.50 - 1.10 mg/dL   Calcium 9.2  8.4 - 86.5 mg/dL   GFR calc non Af Amer >90  >90 mL/min   GFR calc Af Amer >90  >90 mL/min   Dg Chest 2 View  10/22/2012  *RADIOLOGY REPORT*  Clinical Data: Wheezing with shortness of breath and cough.  CHEST - 2 VIEW  Comparison: 10/20/2011  Findings: Two views of the chest demonstrate clear lungs. Heart and mediastinum are within normal limits.  The trachea is midline. Bony thorax is intact. There is a stable small calcification at the right lung base.  IMPRESSION: Stable chest radiograph findings.  No acute cardiopulmonary disease.   Original Report Authenticated By: Richarda Overlie, M.D.       Date: 10/22/2012  Rate: 78  Rhythm: normal sinus rhythm  QRS Axis: normal  Intervals: normal  ST/T Wave abnormalities: normal  Conduction Disutrbances:none  Narrative Interpretation:   Old EKG Reviewed: unchanged   1. COPD (chronic obstructive pulmonary disease)  MDM  Patient history of COPD with worsening cough and congestion and wheezing over the last week. She's had some low-grade fevers at home. She's afebrile here. She was given a duo neb and albuterol neb here with some improvement of symptoms but she still wheezy with diminished breath sounds and still has an oxygen saturation of 91-92% on a nasal cannula. She did not have home oxygen. There is no evidence of pneumonia on the chest x-ray but given the symptoms have been going on over a week I did go ahead and start Levaquin for bronchitis. I will also send a flu swab. I will go ahead and consult the hospitalist at [facility] for admission.        Rolan Bucco, MD 10/22/12 1200

## 2012-10-22 NOTE — ED Notes (Signed)
Reported bed 5N29-c to [facility]--via Patricia Kaiser

## 2012-10-23 DIAGNOSIS — J449 Chronic obstructive pulmonary disease, unspecified: Secondary | ICD-10-CM

## 2012-10-23 DIAGNOSIS — J4489 Other specified chronic obstructive pulmonary disease: Secondary | ICD-10-CM

## 2012-10-23 LAB — GLUCOSE, CAPILLARY
Glucose-Capillary: 323 mg/dL — ABNORMAL HIGH (ref 70–99)
Glucose-Capillary: 171 mg/dL — ABNORMAL HIGH (ref 70–99)
Glucose-Capillary: 288 mg/dL — ABNORMAL HIGH (ref 70–99)

## 2012-10-23 LAB — BASIC METABOLIC PANEL
BUN: 8 mg/dL (ref 6–23)
Calcium: 8.9 mg/dL (ref 8.4–10.5)
GFR calc non Af Amer: >90 mL/min (ref >90)
Glucose, Bld: 251 mg/dL — ABNORMAL HIGH (ref 70–99)

## 2012-10-23 LAB — CBC
MCH: 30.4 pg (ref 26.0–34.0)
MCHC: 33.9 g/dL (ref 30.0–36.0)
Platelets: 258 10*3/uL (ref 150–400)

## 2012-10-23 LAB — TROPONIN I: Troponin I: <0.3 ng/mL (ref <0.30)

## 2012-10-23 MED ORDER — GLIPIZIDE 5 MG PO TABS
5.0000 mg | ORAL_TABLET | Freq: Two times a day (BID) | ORAL | Status: DC
  Administered 2012-10-23 – 2012-10-24 (×3): 5 mg via ORAL
  Filled 2012-10-23 (×5): qty 1

## 2012-10-23 NOTE — Progress Notes (Signed)
Pt ambulated approximately 200 feet while on Room Air. Sats were between 94-97%. O2 dropped to 88% at one time during ambulation, but patient was able to bring the sats to 94%  without supplemental oxygen. After ambulation patient at rest was at 94% room air and stated she thought the neb treatment had helped and she was feeling much better.

## 2012-10-23 NOTE — Progress Notes (Signed)
Utilization review completed. Crystelle Ferrufino, RN, BSN. 

## 2012-10-23 NOTE — Progress Notes (Signed)
[facility] PROGRESS NOTE  Patricia Kaiser ZOX:[id number] DOB: 08-Jul-1956 DOA: 10/22/2012 PCP: No primary provider on file.  Assessment/Plan: Principal Problem:  *COPD exacerbation Active Problems:  Hypertension  Hyperlipemia  Tobacco use  Hypokalemia    Assessment & Plan by Problem:  Acute respiratory failure due to COPD exacerbation  Suspect patient had upper viral respiratory infection which likely triggered her COPD exacerbation. Continue prednisone 40 mg daily. Continue levofloxacin for 4 more days to complete a five-day course. Influenza PCR is negative Continue neb treatments. We'll check a d-dimer if elevated we'll do a CT scan to rule out a PE Tobacco use  Patient counseled on cessation. Nicotine patch prescribed. Patient reports that she is motivated to quit smoking.  Hypertension  Continue home lisinopril. Stable.  Hypokalemia  Replace as needed.  Hyperlipidemia  Continue statin.  Diabetes?  Not on any diabetic medications. Sensitive sliding scale insulin. Hemoglobin A1c of 7.5. Start the patient on glipizide Nausea and vomiting  Likely due to recent upper respiratory viral infection. Improved. Continue to monitor.  Prophylaxis  Lovenox.  CODE STATUS  Full code.    Brief narrative: Patricia Kaiser is a 57 y.o. Caucasian female with history of hypertension, diabetes? not on any diabetic medications, and COPD who presents with the above complaints. Patient reported that on 10/15/2012 she had developed sinus congestion and fever. She had presented to her primary care physician's office on 10/16/2012 for further evaluation and was given cough medications. Since then her symptoms have worsened and she has been feeling increasingly short of breath with increasing wheezing. She has also been having nauseated with dry heaving. Her shortness of breath worsened since 10/18/2012. She has been using inhaler and nebulizer without any relief. As a result she presented to the  emergency department for further evaluation. She was given prednisone, neb treatments, and levofloxacin and the hospitalist service was asked to admit the patient for further care and management. Patient does admit to having some fevers at home. Complaining of some sinus congestion and drainage. Denies any abdominal pain, but had diarrhea on 10/17/2012 and 10/18/2012 which since then she has not had any episode. Has been having headaches due to lack of sleep. Denies any vision changes       HPI/Subjective: Still has a nonproductive cough complaining of shortness of breath with exertion  Objective: Filed Vitals:   10/22/12 1602 10/22/12 2205 10/23/12 0203 10/23/12 0541  BP:  131/69  129/68  Pulse:  80  64  Temp:  98.2 F (36.8 C)  97.8 F (36.6 C)  TempSrc:      Resp:  18  18  Weight:      SpO2: 95% 91% 98% 100%    Intake/Output Summary (Last 24 hours) at 10/23/12 0843 Last data filed at 10/23/12 0500  Gross per 24 hour  Intake   1200 ml  Output      0 ml  Net   1200 ml    Exam:  HENT:  Head: Atraumatic.  Nose: Nose normal.  Mouth/Throat: Oropharynx is clear and moist.  Eyes: Conjunctivae are normal. Pupils are equal, round, and reactive to light. No scleral icterus.  Neck: Neck supple. No tracheal deviation present.  Cardiovascular: Normal rate, regular rhythm, normal heart sounds and intact distal pulses.  Pulmonary/Chest: Effort normal and breath sounds normal. No respiratory distress.  Abdominal: Soft. Normal appearance and bowel sounds are normal. She exhibits no distension. There is no tenderness.  Musculoskeletal: She exhibits no edema and no tenderness.  Neurological: She is alert. No cranial nerve deficit.    Data Reviewed: Basic Metabolic Panel:  Lab 10/23/12 6213 10/22/12 1030  NA 138 140  K 3.7 3.4*  CL 102 98  CO2 23 27  GLUCOSE 251* 167*  BUN 8 10  CREATININE 0.41* 0.60  CALCIUM 8.9 9.2  MG -- --  PHOS -- --    Liver Function Tests: No  results found for this basename: AST:5,ALT:5,ALKPHOS:5,BILITOT:5,PROT:5,ALBUMIN:5 in the last 168 hours No results found for this basename: LIPASE:5,AMYLASE:5 in the last 168 hours No results found for this basename: AMMONIA:5 in the last 168 hours  CBC:  Lab 10/23/12 0745 10/22/12 1030  WBC 10.1 8.6  NEUTROABS -- 3.7  HGB 13.1 15.0  HCT 38.7 44.3  MCV 89.8 91.3  PLT 258 301    Cardiac Enzymes: No results found for this basename: CKTOTAL:5,CKMB:5,CKMBINDEX:5,TROPONINI:5 in the last 168 hours BNP (last 3 results) No results found for this basename: PROBNP:3 in the last 8760 hours   CBG:  Lab 10/23/12 0729 10/22/12 2209  GLUCAP 211* 323*    No results found for this or any previous visit (from the past 240 hour(s)).   Studies: Dg Chest 2 View  10/22/2012  *RADIOLOGY REPORT*  Clinical Data: Wheezing with shortness of breath and cough.  CHEST - 2 VIEW  Comparison: 10/20/2011  Findings: Two views of the chest demonstrate clear lungs. Heart and mediastinum are within normal limits.  The trachea is midline. Bony thorax is intact. There is a stable small calcification at the right lung base.  IMPRESSION: Stable chest radiograph findings.  No acute cardiopulmonary disease.   Original Report Authenticated By: Richarda Overlie, M.D.     Scheduled Meds:   . albuterol  2.5 mg Nebulization Q6H  . atorvastatin  20 mg Oral Daily  . enoxaparin (LOVENOX) injection  40 mg Subcutaneous Q24H  . estrogens (conjugated)  1.25 mg Oral Daily  . guaiFENesin  600 mg Oral BID  . insulin aspart  0-5 Units Subcutaneous QHS  . insulin aspart  0-9 Units Subcutaneous TID WC  . ipratropium  0.5 mg Nebulization Q6H  . levofloxacin  750 mg Oral Daily  . lisinopril  5 mg Oral Daily  . nicotine  21 mg Transdermal Daily  . pneumococcal 23 valent vaccine  0.5 mL Intramuscular Tomorrow-1000  . predniSONE  40 mg Oral Q breakfast   Continuous Infusions:   . sodium chloride 75 mL/hr at 10/22/12 1545    Principal  Problem:  *COPD exacerbation Active Problems:  Hypertension  Hyperlipemia  Tobacco use  Hypokalemia    Time spent: 40 minutes   [facility]  [facility] Pager [phone]. If 8PM-8AM, please contact night-coverage at [email or web address], password [facility] 10/23/2012, 8:43 AM  LOS: 1 day

## 2012-10-24 LAB — GLUCOSE, CAPILLARY

## 2012-10-24 MED ORDER — PREDNISONE 20 MG PO TABS: 40.0000 mg | ORAL_TABLET | Freq: Every day | ORAL | Status: AC

## 2012-10-24 MED ORDER — GUAIFENESIN-DM 100-10 MG/5ML PO SYRP: 5.0000 mL | ORAL_SOLUTION | ORAL | Status: DC | PRN

## 2012-10-24 MED ORDER — GUAIFENESIN ER 600 MG PO TB12: 600.0000 mg | ORAL_TABLET | Freq: Two times a day (BID) | ORAL | Status: DC

## 2012-10-24 MED ORDER — GLIPIZIDE 5 MG PO TABS: 5.0000 mg | ORAL_TABLET | Freq: Two times a day (BID) | ORAL | Status: DC

## 2012-10-24 MED ORDER — ALBUTEROL SULFATE (5 MG/ML) 0.5% IN NEBU: 2.5000 mg | INHALATION_SOLUTION | Freq: Four times a day (QID) | RESPIRATORY_TRACT | Status: DC

## 2012-10-24 MED ORDER — NICOTINE 21 MG/24HR TD PT24: 1.0000 | MEDICATED_PATCH | TRANSDERMAL | Status: DC

## 2012-10-24 MED ORDER — LEVOFLOXACIN 750 MG PO TABS: 750.0000 mg | ORAL_TABLET | Freq: Every day | ORAL | Status: AC

## 2012-10-24 MED ORDER — ALBUTEROL SULFATE HFA 108 (90 BASE) MCG/ACT IN AERS: 2.0000 | INHALATION_SPRAY | Freq: Four times a day (QID) | RESPIRATORY_TRACT | Status: DC | PRN

## 2012-10-24 NOTE — Discharge Summary (Signed)
Physician Discharge Summary  Patricia Kaiser MRN: [id number] DOB/AGE: Aug 13, 1956 57 y.o.  PCP: No primary provider on file.   Admit date: 10/22/2012 Discharge date: 10/24/2012  Discharge Diagnoses:  Type 2 diabetes  *COPD exacerbation Active Problems:  Hypertension  Hyperlipemia  Tobacco use  Hypokalemia     Medication List     As of 10/24/2012  9:13 AM    TAKE these medications         albuterol 108 (90 BASE) MCG/ACT inhaler   Commonly known as: PROVENTIL HFA;VENTOLIN HFA   Inhale 2 puffs into the lungs every 6 (six) hours as needed. For difficulty breathing      atorvastatin 20 MG tablet   Commonly known as: LIPITOR   Take 20 mg by mouth daily.      co-enzyme Q-10 30 MG capsule   Take 30 mg by mouth at bedtime.      estrogens (conjugated) 1.25 MG tablet   Commonly known as: PREMARIN   Take 1.25 mg by mouth daily.      glipiZIDE 5 MG tablet   Commonly known as: GLUCOTROL   Take 1 tablet (5 mg total) by mouth 2 (two) times daily before a meal.      guaiFENesin 600 MG 12 hr tablet   Commonly known as: MUCINEX   Take 1 tablet (600 mg total) by mouth 2 (two) times daily.      guaiFENesin-dextromethorphan 100-10 MG/5ML syrup   Commonly known as: ROBITUSSIN DM   Take 5 mLs by mouth every 4 (four) hours as needed for cough.      levofloxacin 750 MG tablet   Commonly known as: LEVAQUIN   Take 1 tablet (750 mg total) by mouth daily.      lisinopril 5 MG tablet   Commonly known as: PRINIVIL,ZESTRIL   Take 5 mg by mouth daily.      nicotine 21 mg/24hr patch   Commonly known as: NICODERM CQ - dosed in mg/24 hours   Place 1 patch onto the skin daily.      predniSONE 20 MG tablet   Commonly known as: DELTASONE   Take 2 tablets (40 mg total) by mouth daily with breakfast.        Discharge Condition: Stable   Disposition: 01-Home or Self Care   Consults: Stable  Significant Diagnostic Studies: Dg Chest 2 View  10/22/2012  *RADIOLOGY REPORT*   Clinical Data: Wheezing with shortness of breath and cough.  CHEST - 2 VIEW  Comparison: 10/20/2011  Findings: Two views of the chest demonstrate clear lungs. Heart and mediastinum are within normal limits.  The trachea is midline. Bony thorax is intact. There is a stable small calcification at the right lung base.  IMPRESSION: Stable chest radiograph findings.  No acute cardiopulmonary disease.   Original Report Authenticated By: Richarda Overlie, M.D.        Microbiology: No results found for this or any previous visit (from the past 240 hour(s)).   Labs: Results for orders placed during the hospital encounter of 10/22/12 (from the past 48 hour(s))  CBC WITH DIFFERENTIAL     Status: Abnormal   Collection Time   10/22/12 10:30 AM      Component Value Range Comment   WBC 8.6  4.0 - 10.5 K/uL    RBC 4.85  3.87 - 5.11 MIL/uL    Hemoglobin 15.0  12.0 - 15.0 g/dL    HCT 40.9  81.1 - 91.4 %    MCV 91.3  78.0 - 100.0 fL    MCH 30.9  26.0 - 34.0 pg    MCHC 33.9  30.0 - 36.0 g/dL    RDW 96.0  45.4 - 09.8 %    Platelets 301  150 - 400 K/uL    Neutrophils Relative 42 (*) 43 - 77 %    Neutro Abs 3.7  1.7 - 7.7 K/uL    Lymphocytes Relative 50 (*) 12 - 46 %    Lymphs Abs 4.3 (*) 0.7 - 4.0 K/uL    Monocytes Relative 6  3 - 12 %    Monocytes Absolute 0.5  0.1 - 1.0 K/uL    Eosinophils Relative 1  0 - 5 %    Eosinophils Absolute 0.1  0.0 - 0.7 K/uL    Basophils Relative 0  0 - 1 %    Basophils Absolute 0.0  0.0 - 0.1 K/uL    WBC Morphology ATYPICAL LYMPHOCYTES     BASIC METABOLIC PANEL     Status: Abnormal   Collection Time   10/22/12 10:30 AM      Component Value Range Comment   Sodium 140  135 - 145 mEq/L    Potassium 3.4 (*) 3.5 - 5.1 mEq/L    Chloride 98  96 - 112 mEq/L    CO2 27  19 - 32 mEq/L    Glucose, Bld 167 (*) 70 - 99 mg/dL    BUN 10  6 - 23 mg/dL    Creatinine, Ser 1.19  0.50 - 1.10 mg/dL    Calcium 9.2  8.4 - 14.7 mg/dL    GFR calc non Af Amer >90  >90 mL/min    GFR calc Af Amer  >90  >90 mL/min   INFLUENZA PANEL BY PCR     Status: Normal   Collection Time   10/22/12  1:47 PM      Component Value Range Comment   Influenza A By PCR NEGATIVE  NEGATIVE    Influenza B By PCR NEGATIVE  NEGATIVE    H1N1 flu by pcr NOT DETECTED  NOT DETECTED   HEMOGLOBIN A1C     Status: Abnormal   Collection Time   10/22/12  5:06 PM      Component Value Range Comment   Hemoglobin A1C 7.5 (*) <5.7 %    Mean Plasma Glucose 169 (*) <117 mg/dL   GLUCOSE, CAPILLARY     Status: Abnormal   Collection Time   10/22/12 10:09 PM      Component Value Range Comment   Glucose-Capillary 323 (*) 70 - 99 mg/dL   GLUCOSE, CAPILLARY     Status: Abnormal   Collection Time   10/23/12  7:29 AM      Component Value Range Comment   Glucose-Capillary 211 (*) 70 - 99 mg/dL   BASIC METABOLIC PANEL     Status: Abnormal   Collection Time   10/23/12  7:45 AM      Component Value Range Comment   Sodium 138  135 - 145 mEq/L    Potassium 3.7  3.5 - 5.1 mEq/L    Chloride 102  96 - 112 mEq/L    CO2 23  19 - 32 mEq/L    Glucose, Bld 251 (*) 70 - 99 mg/dL    BUN 8  6 - 23 mg/dL    Creatinine, Ser 8.29 (*) 0.50 - 1.10 mg/dL    Calcium 8.9  8.4 - 56.2 mg/dL    GFR calc non Af Amer >  90  >90 mL/min    GFR calc Af Amer >90  >90 mL/min   CBC     Status: Normal   Collection Time   10/23/12  7:45 AM      Component Value Range Comment   WBC 10.1  4.0 - 10.5 K/uL    RBC 4.31  3.87 - 5.11 MIL/uL    Hemoglobin 13.1  12.0 - 15.0 g/dL    HCT 40.9  81.1 - 91.4 %    MCV 89.8  78.0 - 100.0 fL    MCH 30.4  26.0 - 34.0 pg    MCHC 33.9  30.0 - 36.0 g/dL    RDW 78.2  95.6 - 21.3 %    Platelets 258  150 - 400 K/uL   D-DIMER, QUANTITATIVE     Status: Normal   Collection Time   10/23/12  9:07 AM      Component Value Range Comment   D-Dimer, Quant 0.31  0.00 - 0.48 ug/mL-FEU   TROPONIN I     Status: Normal   Collection Time   10/23/12  9:07 AM      Component Value Range Comment   Troponin I <0.30  <0.30 ng/mL   GLUCOSE, CAPILLARY      Status: Abnormal   Collection Time   10/23/12 11:28 AM      Component Value Range Comment   Glucose-Capillary 171 (*) 70 - 99 mg/dL    Comment 1 Notify RN      Comment 2 Documented in Chart     TROPONIN I     Status: Normal   Collection Time   10/23/12  3:01 PM      Component Value Range Comment   Troponin I <0.30  <0.30 ng/mL   GLUCOSE, CAPILLARY     Status: Abnormal   Collection Time   10/23/12  4:35 PM      Component Value Range Comment   Glucose-Capillary 249 (*) 70 - 99 mg/dL   TROPONIN I     Status: Normal   Collection Time   10/23/12  8:12 PM      Component Value Range Comment   Troponin I <0.30  <0.30 ng/mL   GLUCOSE, CAPILLARY     Status: Abnormal   Collection Time   10/23/12  9:47 PM      Component Value Range Comment   Glucose-Capillary 288 (*) 70 - 99 mg/dL    Comment 1 Notify RN     GLUCOSE, CAPILLARY     Status: Abnormal   Collection Time   10/24/12  6:27 AM      Component Value Range Comment   Glucose-Capillary 119 (*) 70 - 99 mg/dL      HPI :Patricia Kaiser is a 57 y.o. Caucasian female with history of hypertension, diabetes? not on any diabetic medications, and COPD who presents with the above complaints. Patient reported that on 10/15/2012 she had developed sinus congestion and fever. She had presented to her primary care physician's office on 10/16/2012 for further evaluation and was given cough medications. Since then her symptoms have worsened and she has been feeling increasingly short of breath with increasing wheezing. She has also been having nauseated with dry heaving. Her shortness of breath worsened since 10/18/2012. She has been using inhaler and nebulizer without any relief. As a result she presented to the emergency department for further evaluation. She was given prednisone, neb treatments, and levofloxacin and the hospitalist service was asked to admit the patient for  further care and management. Patient does admit to having some fevers at home. Complaining  of some sinus congestion and drainage. Denies any abdominal pain, but had diarrhea on 10/17/2012 and 10/18/2012 which since then she has not had any episode. Has been having headaches due to lack of sleep. Denies any vision changes  HOSPITAL COURSE:  Acute respiratory failure due to COPD exacerbation  Suspect patient had upper viral respiratory infection which likely triggered her COPD exacerbation. Continue prednisone 40 mg daily 5 more days. Continue levofloxacin for 4 more days to complete a five-day course. Influenza PCR is negative Continue neb treatments. D-dimer was negative, cardiac enzymes were negative Tobacco use  Patient counseled on cessation. Nicotine patch prescribed. Patient reports that she is motivated to quit smoking.  Hypertension  Continue home lisinopril. Stable.  Hypokalemia  Replace as needed.  Hyperlipidemia  Continue statin.  Diabetes? Hemoglobin A1c of 7.5 Not on any diabetic medications. Sensitive sliding scale insulin was started in the hospital. Hemoglobin A1c of 7.5. Start the patient on glipizide twice a day Nausea and vomiting  Likely due to recent upper respiratory viral infection. Improved. Continue to monitor.      Discharge Exam:  Blood pressure 115/61, pulse 83, temperature 98.3 F (36.8 C), temperature source Oral, resp. rate 18, weight 53.071 kg (117 lb), SpO2 93.00%.  General: Awake, Oriented x3, No acute distress.  HEENT: EOMI, Moist mucous membranes  Neck: Supple  CV: S1 and S2  Lungs: Expiratory greater than inspiratory wheezing bilaterally, moderate air movement.  Abdomen: Soft, Nontender, Nondistended, +bowel sounds.  Ext: Good pulses. Trace edema. No clubbing or cyanosis noted.  Neuro: Cranial Nerves II-XII grossly intact. Has 5/5 motor strength in upper and lower extremities        Discharge Orders    Future Orders Please Complete By Expires   Diet - low sodium heart healthy      Increase activity slowly         Follow-up  Information    Follow up with Primary care provider. Schedule an appointment as soon as possible for a visit in 1 week.         SignedRicharda Overlie 10/24/2012, 9:13 AM

## 2012-10-24 NOTE — Progress Notes (Signed)
Pt discharged after orders reviewed and revised. Pt took all belongings with her, went through room with RN to make sure all belongings with pt and husband. Given prescriptions, explained after visit summary, and given PNA vaccine.   Delynn Flavin, RN, BSN

## 2013-03-30 ENCOUNTER — Ambulatory Visit (INDEPENDENT_AMBULATORY_CARE_PROVIDER_SITE_OTHER): Payer: BC Managed Care – PPO | Admitting: Family Medicine

## 2013-03-30 ENCOUNTER — Encounter: Payer: Self-pay | Admitting: Family Medicine

## 2013-03-30 ENCOUNTER — Ambulatory Visit (INDEPENDENT_AMBULATORY_CARE_PROVIDER_SITE_OTHER): Payer: BC Managed Care – PPO

## 2013-03-30 VITALS — BP 123/73 | HR 72 | Temp 97.8°F | Wt 118.0 lb

## 2013-03-30 DIAGNOSIS — M25579 Pain in unspecified ankle and joints of unspecified foot: Secondary | ICD-10-CM

## 2013-03-30 DIAGNOSIS — E119 Type 2 diabetes mellitus without complications: Secondary | ICD-10-CM

## 2013-03-30 DIAGNOSIS — M25571 Pain in right ankle and joints of right foot: Secondary | ICD-10-CM

## 2013-03-30 DIAGNOSIS — E1142 Type 2 diabetes mellitus with diabetic polyneuropathy: Secondary | ICD-10-CM

## 2013-03-30 DIAGNOSIS — E1140 Type 2 diabetes mellitus with diabetic neuropathy, unspecified: Secondary | ICD-10-CM

## 2013-03-30 DIAGNOSIS — E1149 Type 2 diabetes mellitus with other diabetic neurological complication: Secondary | ICD-10-CM

## 2013-03-30 LAB — POCT GLYCOSYLATED HEMOGLOBIN (HGB A1C): Hemoglobin A1C: 7.2

## 2013-03-30 MED ORDER — GABAPENTIN 100 MG PO CAPS: 100.0000 mg | ORAL_CAPSULE | Freq: Three times a day (TID) | ORAL | Status: DC

## 2013-03-30 NOTE — Progress Notes (Signed)
  Subjective:    Patient ID: Patricia Kaiser, female    DOB: August 24, 1956, 57 y.o.   MRN: [id number]  HPI Pt presents today with chief complaint of R foot/great toe pain.  Has had pain ambulating predominantly on plantar aspect of great.  Mild burning of both feet on a regular basis.  Baseline diabetic. Suboptimal control of diabetes at baseline, though not poor.  No known trauma or injury    Review of Systems  All other systems reviewed and are negative.       Objective:   Physical Exam  Constitutional: She appears well-developed and well-nourished.  HENT:  Head: Normocephalic and atraumatic.  Eyes: Conjunctivae are normal. Pupils are equal, round, and reactive to light.  Neck: Normal range of motion.  Cardiovascular: Normal rate and regular rhythm.   Pulmonary/Chest: Effort normal.  Abdominal: Soft.  Musculoskeletal:       Feet:  Skin: Skin is warm.      [facility] reading (PRIMARY) by  Dr. Alvester Morin  Preliminarily with no fracture, dislocation. Minimal degenerative changes in 1st MCP.                                       Assessment & Plan:  Pain in joint, ankle and foot, right - Plan: DG Foot Complete Right, Uric acid  DM (diabetes mellitus) - Plan: POCT glycosylated hemoglobin (Hb A1C)  Diabetic neuropathy - Plan: gabapentin (NEURONTIN) 100 MG capsule, Ambulatory referral to Podiatry   R foot xray preliminarily with no fracture or dislocation.  DDX includes metatarsalgia, gout, diabetic neuropathy.  Check uric acid  Start on neurontin Podiatry referral as pt with need this annually  Check A1c  Follow up pending bloodwork and formal imaging read.

## 2013-03-31 LAB — URIC ACID: Uric Acid, Serum: 3.8 mg/dL (ref 2.4–6.0)

## 2013-04-17 ENCOUNTER — Other Ambulatory Visit: Payer: Self-pay | Admitting: Family Medicine

## 2013-04-17 MED ORDER — METFORMIN HCL 500 MG PO TABS: 500.0000 mg | ORAL_TABLET | Freq: Two times a day (BID) | ORAL | Status: DC

## 2013-04-18 ENCOUNTER — Telehealth: Payer: Self-pay | Admitting: Family Medicine

## 2013-04-18 NOTE — Telephone Encounter (Signed)
Left message for pt to return call.

## 2013-04-26 ENCOUNTER — Telehealth: Payer: Self-pay | Admitting: *Deleted

## 2013-04-26 MED ORDER — BUDESONIDE-FORMOTEROL FUMARATE 160-4.5 MCG/ACT IN AERO: 2.0000 | INHALATION_SPRAY | Freq: Two times a day (BID) | RESPIRATORY_TRACT | Status: DC

## 2013-04-26 NOTE — Telephone Encounter (Signed)
Pt can work on diet, but metformin remains 1st line treatment for diabetes.  Can revisit issue in 3 months  Thank you

## 2013-04-26 NOTE — Telephone Encounter (Signed)
Called and gave patient lab results. She states that she can not take metformin and that HgB A1C has been elevated for 2 years. States she already takes glipizide bid. Do you wants to make any changes or just want her to continue the same. She also states that 7.2 is much lower than it has been in past and that she really has not been watching her diet and her regular routine in the last 3 months. Please advise

## 2013-05-02 ENCOUNTER — Telehealth: Payer: Self-pay | Admitting: Cardiovascular Disease

## 2013-05-14 ENCOUNTER — Other Ambulatory Visit: Payer: Self-pay

## 2013-05-14 MED ORDER — GLIPIZIDE 5 MG PO TABS: 5.0000 mg | ORAL_TABLET | Freq: Two times a day (BID) | ORAL | Status: DC

## 2013-06-04 ENCOUNTER — Ambulatory Visit (HOSPITAL_COMMUNITY)
Admission: RE | Admit: 2013-06-04 | Discharge: 2013-06-04 | Disposition: A | Discharge status: Home / Self Care | Payer: BC Managed Care – PPO | Source: Ambulatory Visit | Attending: Cardiovascular Disease | Admitting: Cardiovascular Disease

## 2013-06-04 DIAGNOSIS — I10 Essential (primary) hypertension: Secondary | ICD-10-CM | POA: Insufficient documentation

## 2013-06-04 DIAGNOSIS — I701 Atherosclerosis of renal artery: Secondary | ICD-10-CM

## 2013-06-04 NOTE — Progress Notes (Signed)
Renal Duplex Completed. Anapaula Severt, BS, RDMS, RVT  

## 2013-06-06 ENCOUNTER — Encounter: Payer: Self-pay | Admitting: *Deleted

## 2013-06-08 ENCOUNTER — Encounter: Payer: Self-pay | Admitting: Cardiovascular Disease

## 2013-06-11 ENCOUNTER — Ambulatory Visit (INDEPENDENT_AMBULATORY_CARE_PROVIDER_SITE_OTHER): Payer: BC Managed Care – PPO | Admitting: Cardiovascular Disease

## 2013-06-11 ENCOUNTER — Encounter: Payer: Self-pay | Admitting: Cardiovascular Disease

## 2013-06-11 VITALS — BP 128/90 | HR 67 | Ht 62.0 in | Wt 120.0 lb

## 2013-06-11 DIAGNOSIS — I10 Essential (primary) hypertension: Secondary | ICD-10-CM

## 2013-06-11 NOTE — Assessment & Plan Note (Signed)
Controlled on current medications. Recent renal Dopplers performed 06/04/13 where essentially normal

## 2013-06-11 NOTE — Patient Instructions (Addendum)
Your physician wants you to follow-up in: 1 year with Dr Allyson Sabal.  You will receive a reminder letter in the mail two months in advance. If you don't receive a letter, please call our office to schedule the follow-up appointment.  We will cancel your routine renal dopplers

## 2013-06-11 NOTE — Assessment & Plan Note (Signed)
On statin therapy followed by her PCP 

## 2013-06-11 NOTE — Assessment & Plan Note (Signed)
Ongoing tobacco abuse with dyspnea on exertion. Negative Myoview and MET test in the past.

## 2013-06-11 NOTE — Progress Notes (Signed)
06/11/2013 Patricia Kaiser   30-Dec-1955  [id number]  Primary Physician Patricia Heap, MD Primary Cardiologist: Patricia Gess MD Patricia Kaiser   HPI:  The patient is a 57 year old thin-appearing married Caucasian female, mother of 2, who I saw 5 months ago. She has a history of hypertension, hyperlipidemia, non-insulin-requiring diabetes, and COPD with ongoing tobacco abuse, as well as a strong family history for heart disease. She had a negative nuclear stress test and MET-TEST. She does complain of shortness of breath and recently had the flu. Total cholesterol in October was 166 with an LDL of 79.I saw her back in the office 11/27/12 she Patricia Kaiser clinically stable    Current Outpatient Prescriptions  Medication Sig Dispense Refill  . albuterol (PROVENTIL HFA;VENTOLIN HFA) 108 (90 BASE) MCG/ACT inhaler Inhale 2 puffs into the lungs every 6 (six) hours as needed. For difficulty breathing  1 Inhaler  0  . atorvastatin (LIPITOR) 20 MG tablet Take 20 mg by mouth daily.       . budesonide-formoterol (SYMBICORT) 160-4.5 MCG/ACT inhaler Inhale 2 puffs into the lungs 2 (two) times daily.  1 Inhaler  4  . co-enzyme Q-10 30 MG capsule Take 30 mg by mouth at bedtime.      Marland Kitchen estrogens, conjugated, (PREMARIN) 1.25 MG tablet Take 1.25 mg by mouth daily.        Marland Kitchen glipiZIDE (GLUCOTROL) 5 MG tablet Take 1 tablet (5 mg total) by mouth 2 (two) times daily before a meal.  60 tablet  1  . lisinopril (PRINIVIL,ZESTRIL) 5 MG tablet Take 5 mg by mouth daily.       Marland Kitchen OVER THE COUNTER MEDICATION Take 1 tablet by mouth daily. Hair, Skin and Nails      . VOLTAREN 1 % GEL Apply 1 application topically as needed.       No current facility-administered medications for this visit.    Allergies  Allergen Reactions  . Codeine   . Erythromycin   . Gabapentin Nausea And Vomiting    GI upset, pt claims it made her "drunk"  . Metformin And Related Nausea And Vomiting  . Penicillins   . Sulfa Antibiotics       History   Social History  . Marital Status: Married    Spouse Name: N/A    Number of Children: N/A  . Years of Education: N/A   Occupational History  . Not on file.   Social History Main Topics  . Smoking status: Current Every Day Smoker -- 0.25 packs/day for 30 years    Types: Cigarettes  . Smokeless tobacco: Never Used  . Alcohol Use: Yes     Comment: Occasionally, not very often  . Drug Use: No  . Sexual Activity: Yes   Other Topics Concern  . Not on file   Social History Narrative  . No narrative on file     Review of Systems: General: negative for chills, fever, night sweats or weight changes.  Cardiovascular: negative for chest pain, dyspnea on exertion, edema, orthopnea, palpitations, paroxysmal nocturnal dyspnea or shortness of breath Dermatological: negative for rash Respiratory: negative for cough or wheezing Urologic: negative for hematuria Abdominal: negative for nausea, vomiting, diarrhea, bright red blood per rectum, melena, or hematemesis Neurologic: negative for visual changes, syncope, or dizziness All other systems reviewed and are otherwise negative except as noted above.    Blood pressure 128/90, pulse 67, height 5\' 2"  (1.575 m), weight 120 lb (54.432 kg).  General appearance: alert and no distress  Neck: no adenopathy, no carotid bruit, no JVD, supple, symmetrical, trachea midline and thyroid not enlarged, symmetric, no tenderness/mass/nodules Lungs: clear to auscultation bilaterally Heart: regular rate and rhythm, S1, S2 normal, no murmur, click, rub or gallop Extremities: extremities normal, atraumatic, no cyanosis or edema  EKG sinus rhythm at 67 without ST or T wave changes  ASSESSMENT AND PLAN:   Hyperlipemia On statin therapy followed by her PCP  Hypertension Controlled on current medications. Recent renal Dopplers performed 06/04/13 where essentially normal  COPD (chronic obstructive pulmonary disease) Ongoing tobacco abuse with  dyspnea on exertion. Negative Myoview and MET test in the past.      Patricia Gess MD [facility], [facility] 06/11/2013 12:14 PM

## 2013-06-19 ENCOUNTER — Other Ambulatory Visit: Payer: Self-pay | Admitting: Nurse Practitioner

## 2013-06-26 ENCOUNTER — Ambulatory Visit (INDEPENDENT_AMBULATORY_CARE_PROVIDER_SITE_OTHER): Payer: BC Managed Care – PPO | Admitting: General Practice

## 2013-06-26 ENCOUNTER — Encounter: Payer: Self-pay | Admitting: General Practice

## 2013-06-26 VITALS — BP 128/72 | HR 92 | Temp 98.4°F | Ht 62.3 in | Wt 117.0 lb

## 2013-06-26 DIAGNOSIS — I10 Essential (primary) hypertension: Secondary | ICD-10-CM

## 2013-06-26 DIAGNOSIS — J069 Acute upper respiratory infection, unspecified: Secondary | ICD-10-CM

## 2013-06-26 DIAGNOSIS — E785 Hyperlipidemia, unspecified: Secondary | ICD-10-CM

## 2013-06-26 DIAGNOSIS — E119 Type 2 diabetes mellitus without complications: Secondary | ICD-10-CM

## 2013-06-26 DIAGNOSIS — Z124 Encounter for screening for malignant neoplasm of cervix: Secondary | ICD-10-CM

## 2013-06-26 DIAGNOSIS — Z0000 Encounter for general adult medical examination without abnormal findings: Secondary | ICD-10-CM

## 2013-06-26 DIAGNOSIS — Z79890 Hormone replacement therapy (postmenopausal): Secondary | ICD-10-CM

## 2013-06-26 LAB — POCT UA - MICROSCOPIC ONLY: Yeast, UA: NEGATIVE

## 2013-06-26 LAB — POCT URINALYSIS DIPSTICK
Glucose, UA: NEGATIVE
Nitrite, UA: NEGATIVE
Protein, UA: NEGATIVE
Spec Grav, UA: 1.01
Urobilinogen, UA: NEGATIVE

## 2013-06-26 LAB — POCT CBC
Granulocyte percent: 70.4 %G (ref 37–80)
HCT, POC: 43.6 % (ref 37.7–47.9)
Lymph, poc: 1.7 (ref 0.6–3.4)
MCHC: 33.4 g/dL (ref 31.8–35.4)
POC Granulocyte: 5.1 (ref 2–6.9)
Platelet Count, POC: 293 10*3/uL (ref 142–424)
RDW, POC: 12.7 %
WBC: 7.2 10*3/uL (ref 4.6–10.2)

## 2013-06-26 MED ORDER — ESTROGENS CONJUGATED 1.25 MG PO TABS: 1.2500 mg | ORAL_TABLET | Freq: Every day | ORAL | Status: DC

## 2013-06-26 MED ORDER — GLIPIZIDE 5 MG PO TABS: 5.0000 mg | ORAL_TABLET | Freq: Two times a day (BID) | ORAL | Status: DC

## 2013-06-26 MED ORDER — ATORVASTATIN CALCIUM 20 MG PO TABS: 20.0000 mg | ORAL_TABLET | Freq: Every day | ORAL | Status: DC

## 2013-06-26 MED ORDER — DOXYCYCLINE HYCLATE 100 MG PO TABS: 100.0000 mg | ORAL_TABLET | Freq: Two times a day (BID) | ORAL | Status: DC

## 2013-06-26 MED ORDER — LISINOPRIL 5 MG PO TABS: 5.0000 mg | ORAL_TABLET | Freq: Every day | ORAL | Status: DC

## 2013-06-26 NOTE — Progress Notes (Signed)
Subjective:    Patient ID: Patricia Kaiser, female    DOB: Feb 19, 1956, 57 y.o.   MRN: [id number]  HPI Patient presents today for annual exam including pap. She reports having had a hysterectomy 28-29 years ago. She reports eating healthy and very active (riding horses, walking, and yard work). She reports smoking 5 cigarettes daily, which is a decrease from 1ppd.     Review of Systems  Constitutional: Negative for fever and chills.  HENT: Negative for neck pain and neck stiffness.   Respiratory: Positive for cough. Negative for chest tightness and wheezing.   Cardiovascular: Negative for chest pain.  Gastrointestinal: Negative for vomiting, abdominal pain, diarrhea and blood in stool.  Genitourinary: Negative for hematuria and difficulty urinating.  Neurological: Negative for dizziness, weakness and headaches.       Objective:   Physical Exam  Constitutional: She is oriented to person, place, and time. She appears well-developed and well-nourished.  HENT:  Head: Normocephalic and atraumatic.  Right Ear: External ear normal.  Left Ear: External ear normal.  Mouth/Throat: Oropharynx is clear and moist.  Eyes: Conjunctivae and EOM are normal. Pupils are equal, round, and reactive to light.  Neck: Normal range of motion. Neck supple. No thyromegaly present.  Cardiovascular: Normal rate, regular rhythm, normal heart sounds and intact distal pulses.   Pulmonary/Chest: Effort normal and breath sounds normal. No respiratory distress. She exhibits no tenderness. Right breast exhibits no inverted nipple, no mass, no nipple discharge, no skin change and no tenderness. Left breast exhibits no inverted nipple, no mass, no nipple discharge, no skin change and no tenderness. Breasts are symmetrical.  Abdominal: Soft. Bowel sounds are normal. She exhibits no distension.  Genitourinary: No breast swelling, tenderness, discharge or bleeding. No labial fusion. There is no rash, tenderness, lesion or  injury on the right labia. There is no rash, tenderness, lesion or injury on the left labia. No erythema, tenderness or bleeding around the vagina. No foreign body around the vagina. No signs of injury around the vagina. No vaginal discharge found.  Lymphadenopathy:    She has no cervical adenopathy.  Neurological: She is alert and oriented to person, place, and time.  Skin: Skin is warm and dry.  Psychiatric: She has a normal mood and affect.   Results for orders placed in visit on 06/26/13  POCT URINALYSIS DIPSTICK      Result Value Range   Color, UA yellow     Clarity, UA clear     Glucose, UA neg     Bilirubin, UA neg     Ketones, UA neg     Spec Grav, UA 1.010     Blood, UA trace     pH, UA 6.0     Protein, UA neg     Urobilinogen, UA negative     Nitrite, UA neg     Leukocytes, UA Trace    POCT UA - MICROSCOPIC ONLY      Result Value Range   WBC, Ur, HPF, POC 1-5     RBC, urine, microscopic 1-5     Bacteria, U Microscopic neg     Mucus, UA neg     Epithelial cells, urine per micros occ     Crystals, Ur, HPF, POC neg     Casts, Ur, LPF, POC neg     Yeast, UA neg    POCT CBC      Result Value Range   WBC 7.2  4.6 - 10.2 K/uL   Lymph,  poc 1.7  0.6 - 3.4   POC LYMPH PERCENT 24.1  10 - 50 %L   POC Granulocyte 5.1  2 - 6.9   Granulocyte percent 70.4  37 - 80 %G   RBC 4.9  4.04 - 5.48 M/uL   Hemoglobin 14.6  12.2 - 16.2 g/dL   HCT, POC 29.5  62.1 - 47.9 %   MCV 89.2  80 - 97 fL   MCH, POC 29.8  27 - 31.2 pg   MCHC 33.4  31.8 - 35.4 g/dL   RDW, POC 30.8     Platelet Count, POC 293.0  142 - 424 K/uL   MPV 7.6  0 - 99.8 fL          Assessment & Plan:  1. Annual physical exam - POCT urinalysis dipstick - POCT UA - Microscopic Only  2. Acute upper respiratory infections of unspecified site - doxycycline (VIBRA-TABS) 100 MG tablet; Take 1 tablet (100 mg total) by mouth 2 (two) times daily.  Dispense: 20 tablet; Refill: 0  3. Hypertension - POCT CBC -  CMP14+EGFR - lisinopril (PRINIVIL,ZESTRIL) 5 MG tablet; Take 1 tablet (5 mg total) by mouth daily.  Dispense: 30 tablet; Refill: 3  4. Other and unspecified hyperlipidemia - NMR, lipoprofile - atorvastatin (LIPITOR) 20 MG tablet; Take 1 tablet (20 mg total) by mouth daily.  Dispense: 30 tablet; Refill: 3  5. Diabetes - glipiZIDE (GLUCOTROL) 5 MG tablet; Take 1 tablet (5 mg total) by mouth 2 (two) times daily before a meal.  Dispense: 60 tablet; Refill: 3  6. Hormone replacement therapy - estrogens, conjugated, (PREMARIN) 1.25 MG tablet; Take 1 tablet (1.25 mg total) by mouth daily.  Dispense: 90 tablet; Refill: 3 -Continue all current medications Labs pending F/u in 3 months Discussed exercise and healthy eating  Patient verbalized understanding Coralie Keens, FNP-C

## 2013-06-26 NOTE — Patient Instructions (Addendum)
Upper Respiratory Infection, Adult An upper respiratory infection (URI) is also sometimes known as the common cold. The upper respiratory tract includes the nose, sinuses, throat, trachea, and bronchi. Bronchi are the airways leading to the lungs. Most people improve within 1 week, but symptoms can last up to 2 weeks. A residual cough may last even longer.  CAUSES Many different viruses can infect the tissues lining the upper respiratory tract. The tissues become irritated and inflamed and often become very moist. Mucus production is also common. A cold is contagious. You can easily spread the virus to others by oral contact. This includes kissing, sharing a glass, coughing, or sneezing. Touching your mouth or nose and then touching a surface, which is then touched by another person, can also spread the virus. SYMPTOMS  Symptoms typically develop 1 to 3 days after you come in contact with a cold virus. Symptoms vary from person to person. They may include:  Runny nose.  Sneezing.  Nasal congestion.  Sinus irritation.  Sore throat.  Loss of voice (laryngitis).  Cough.  Fatigue.  Muscle aches.  Loss of appetite.  Headache.  Low-grade fever. DIAGNOSIS  You might diagnose your own cold based on familiar symptoms, since most people get a cold 2 to 3 times a year. Your caregiver can confirm this based on your exam. Most importantly, your caregiver can check that your symptoms are not due to another disease such as strep throat, sinusitis, pneumonia, asthma, or epiglottitis. Blood tests, throat tests, and X-rays are not necessary to diagnose a common cold, but they may sometimes be helpful in excluding other more serious diseases. Your caregiver will decide if any further tests are required. RISKS AND COMPLICATIONS  You may be at risk for a more severe case of the common cold if you smoke cigarettes, have chronic heart disease (such as heart failure) or lung disease (such as asthma), or if  you have a weakened immune system. The very young and very old are also at risk for more serious infections. Bacterial sinusitis, middle ear infections, and bacterial pneumonia can complicate the common cold. The common cold can worsen asthma and chronic obstructive pulmonary disease (COPD). Sometimes, these complications can require emergency medical care and may be life-threatening. PREVENTION  The best way to protect against getting a cold is to practice good hygiene. Avoid oral or hand contact with people with cold symptoms. Wash your hands often if contact occurs. There is no clear evidence that vitamin C, vitamin E, echinacea, or exercise reduces the chance of developing a cold. However, it is always recommended to get plenty of rest and practice good nutrition. TREATMENT  Treatment is directed at relieving symptoms. There is no cure. Antibiotics are not effective, because the infection is caused by a virus, not by bacteria. Treatment may include:  Increased fluid intake. Sports drinks offer valuable electrolytes, sugars, and fluids.  Breathing heated mist or steam (vaporizer or shower).  Eating chicken soup or other clear broths, and maintaining good nutrition.  Getting plenty of rest.  Using gargles or lozenges for comfort.  Controlling fevers with ibuprofen or acetaminophen as directed by your caregiver.  Increasing usage of your inhaler if you have asthma. Zinc gel and zinc lozenges, taken in the first 24 hours of the common cold, can shorten the duration and lessen the severity of symptoms. Pain medicines may help with fever, muscle aches, and throat pain. A variety of non-prescription medicines are available to treat congestion and runny nose. Your caregiver   can make recommendations and may suggest nasal or lung inhalers for other symptoms.  HOME CARE INSTRUCTIONS   Only take over-the-counter or prescription medicines for pain, discomfort, or fever as directed by your  caregiver.  Use a warm mist humidifier or inhale steam from a shower to increase air moisture. This may keep secretions moist and make it easier to breathe.  Drink enough water and fluids to keep your urine clear or pale yellow.  Rest as needed.  Return to work when your temperature has returned to normal or as your caregiver advises. You may need to stay home longer to avoid infecting others. You can also use a face mask and careful hand washing to prevent spread of the virus. SEEK MEDICAL CARE IF:   After the first few days, you feel you are getting worse rather than better.  You need your caregiver's advice about medicines to control symptoms.  You develop chills, worsening shortness of breath, or brown or red sputum. These may be signs of pneumonia.  You develop yellow or brown nasal discharge or pain in the face, especially when you bend forward. These may be signs of sinusitis.  You develop a fever, swollen neck glands, pain with swallowing, or white areas in the back of your throat. These may be signs of strep throat. SEEK IMMEDIATE MEDICAL CARE IF:   You have a fever.  You develop severe or persistent headache, ear pain, sinus pain, or chest pain.  You develop wheezing, a prolonged cough, cough up blood, or have a change in your usual mucus (if you have chronic lung disease).  You develop sore muscles or a stiff neck. Document Released: 03/30/2001 Document Revised: 12/27/2011 Document Reviewed: 02/05/2011 Exit[employer] Patient Information 2014 [employer]. Smoking Cessation Quitting smoking is important to your health and has many advantages. However, it is not always easy to quit since nicotine is a very addictive drug. Often times, people try 3 times or more before being able to quit. This document explains the best ways for you to prepare to quit smoking. Quitting takes hard work and a lot of effort, but you can do it. ADVANTAGES OF QUITTING SMOKING  You will live longer,  feel better, and live better.  Your body will feel the impact of quitting smoking almost immediately.  Within 20 minutes, blood pressure decreases. Your pulse returns to its normal level.  After 8 hours, carbon monoxide levels in the blood return to normal. Your oxygen level increases.  After 24 hours, the chance of having a heart attack starts to decrease. Your breath, hair, and body stop smelling like smoke.  After 48 hours, damaged nerve endings begin to recover. Your sense of taste and smell improve.  After 72 hours, the body is virtually free of nicotine. Your bronchial tubes relax and breathing becomes easier.  After 2 to 12 weeks, lungs can hold more air. Exercise becomes easier and circulation improves.  The risk of having a heart attack, stroke, cancer, or lung disease is greatly reduced.  After 1 year, the risk of coronary heart disease is cut in half.  After 5 years, the risk of stroke falls to the same as a nonsmoker.  After 10 years, the risk of lung cancer is cut in half and the risk of other cancers decreases significantly.  After 15 years, the risk of coronary heart disease drops, usually to the level of a nonsmoker.  If you are pregnant, quitting smoking will improve your chances of having a healthy   baby.  The people you live with, especially any children, will be healthier.  You will have extra money to spend on things other than cigarettes. QUESTIONS TO THINK ABOUT BEFORE ATTEMPTING TO QUIT You may want to talk about your answers with your caregiver.  Why do you want to quit?  If you tried to quit in the past, what helped and what did not?  What will be the most difficult situations for you after you quit? How will you plan to handle them?  Who can help you through the tough times? Your family? Friends? A caregiver?  What pleasures do you get from smoking? What ways can you still get pleasure if you quit? Here are some questions to ask your  caregiver:  How can you help me to be successful at quitting?  What medicine do you think would be best for me and how should I take it?  What should I do if I need more help?  What is smoking withdrawal like? How can I get information on withdrawal? GET READY  Set a quit date.  Change your environment by getting rid of all cigarettes, ashtrays, matches, and lighters in your home, car, or work. Do not let people smoke in your home.  Review your past attempts to quit. Think about what worked and what did not. GET SUPPORT AND ENCOURAGEMENT You have a better chance of being successful if you have help. You can get support in many ways.  Tell your family, friends, and co-workers that you are going to quit and need their support. Ask them not to smoke around you.  Get individual, group, or telephone counseling and support. Programs are available at local hospitals and health centers. Call your local health department for information about programs in your area.  Spiritual beliefs and practices may help some smokers quit.  Download a "quit meter" on your computer to keep track of quit statistics, such as how long you have gone without smoking, cigarettes not smoked, and money saved.  Get a self-help book about quitting smoking and staying off of tobacco. LEARN NEW SKILLS AND BEHAVIORS  Distract yourself from urges to smoke. Talk to someone, go for a walk, or occupy your time with a task.  Change your normal routine. Take a different route to work. Drink tea instead of coffee. Eat breakfast in a different place.  Reduce your stress. Take a hot bath, exercise, or read a book.  Plan something enjoyable to do every day. Reward yourself for not smoking.  Explore interactive web-based programs that specialize in helping you quit. GET MEDICINE AND USE IT CORRECTLY Medicines can help you stop smoking and decrease the urge to smoke. Combining medicine with the above behavioral methods and  support can greatly increase your chances of successfully quitting smoking.  Nicotine replacement therapy helps deliver nicotine to your body without the negative effects and risks of smoking. Nicotine replacement therapy includes nicotine gum, lozenges, inhalers, nasal sprays, and skin patches. Some may be available over-the-counter and others require a prescription.  Antidepressant medicine helps people abstain from smoking, but how this works is unknown. This medicine is available by prescription.  Nicotinic receptor partial agonist medicine simulates the effect of nicotine in your brain. This medicine is available by prescription. Ask your caregiver for advice about which medicines to use and how to use them based on your health history. Your caregiver will tell you what side effects to look out for if you choose to be on a   medicine or therapy. Carefully read the information on the package. Do not use any other product containing nicotine while using a nicotine replacement product.  RELAPSE OR DIFFICULT SITUATIONS Most relapses occur within the first 3 months after quitting. Do not be discouraged if you start smoking again. Remember, most people try several times before finally quitting. You may have symptoms of withdrawal because your body is used to nicotine. You may crave cigarettes, be irritable, feel very hungry, cough often, get headaches, or have difficulty concentrating. The withdrawal symptoms are only temporary. They are strongest when you first quit, but they will go away within 10 14 days. To reduce the chances of relapse, try to:  Avoid drinking alcohol. Drinking lowers your chances of successfully quitting.  Reduce the amount of caffeine you consume. Once you quit smoking, the amount of caffeine in your body increases and can give you symptoms, such as a rapid heartbeat, sweating, and anxiety.  Avoid smokers because they can make you want to smoke.  Do not let weight gain distract  you. Many smokers will gain weight when they quit, usually less than 10 pounds. Eat a healthy diet and stay active. You can always lose the weight gained after you quit.  Find ways to improve your mood other than smoking. FOR MORE INFORMATION  www.smokefree.gov  Document Released: 09/28/2001 Document Revised: 04/04/2012 Document Reviewed: 01/13/2012 [employer] Patient Information 2014 [employer].  

## 2013-06-28 LAB — CMP14+EGFR
ALT: 13 IU/L (ref 0–32)
AST: 16 IU/L (ref 0–40)
CO2: 25 mmol/L (ref 18–29)
Calcium: 9.4 mg/dL (ref 8.7–10.2)
Glucose: 127 mg/dL — ABNORMAL HIGH (ref 65–99)
Potassium: 4.5 mmol/L (ref 3.5–5.2)
Sodium: 140 mmol/L (ref 134–144)
Total Protein: 6.6 g/dL (ref 6.0–8.5)

## 2013-06-28 LAB — NMR, LIPOPROFILE
Cholesterol: 149 mg/dL (ref <200)
HDL Particle Number: 50 umol/L (ref >=30.5)
LDL Particle Number: 1208 nmol/L — ABNORMAL HIGH (ref <1000)
LDL Size: 20.3 nm — ABNORMAL LOW (ref >20.5)
LP-IR Score: 72 — ABNORMAL HIGH (ref <=45)

## 2013-06-29 LAB — PAP IG W/ RFLX HPV ASCU

## 2013-07-14 NOTE — Addendum Note (Signed)
Addended by: Orma Render F on: 07/14/2013 02:26 PM   Modules accepted: Orders

## 2013-07-16 LAB — POCT GLYCOSYLATED HEMOGLOBIN (HGB A1C): Hemoglobin A1C: 7

## 2013-07-31 ENCOUNTER — Encounter (INDEPENDENT_AMBULATORY_CARE_PROVIDER_SITE_OTHER): Payer: Self-pay

## 2013-07-31 ENCOUNTER — Ambulatory Visit (HOSPITAL_COMMUNITY)
Admission: RE | Admit: 2013-07-31 | Discharge: 2013-07-31 | Disposition: A | Discharge status: Home / Self Care | Payer: BC Managed Care – PPO | Source: Ambulatory Visit | Attending: General Practice | Admitting: General Practice

## 2013-07-31 ENCOUNTER — Ambulatory Visit (INDEPENDENT_AMBULATORY_CARE_PROVIDER_SITE_OTHER): Payer: BC Managed Care – PPO | Admitting: General Practice

## 2013-07-31 ENCOUNTER — Ambulatory Visit (INDEPENDENT_AMBULATORY_CARE_PROVIDER_SITE_OTHER): Payer: BC Managed Care – PPO

## 2013-07-31 VITALS — BP 136/75 | HR 102 | Temp 97.3°F | Ht 62.0 in | Wt 117.0 lb

## 2013-07-31 DIAGNOSIS — R109 Unspecified abdominal pain: Secondary | ICD-10-CM

## 2013-07-31 DIAGNOSIS — N200 Calculus of kidney: Secondary | ICD-10-CM | POA: Insufficient documentation

## 2013-07-31 DIAGNOSIS — R300 Dysuria: Secondary | ICD-10-CM

## 2013-07-31 DIAGNOSIS — R1032 Left lower quadrant pain: Secondary | ICD-10-CM | POA: Insufficient documentation

## 2013-07-31 LAB — POCT UA - MICROSCOPIC ONLY
Bacteria, U Microscopic: NEGATIVE
Casts, Ur, LPF, POC: NEGATIVE
WBC, Ur, HPF, POC: NEGATIVE

## 2013-07-31 LAB — POCT URINALYSIS DIPSTICK
Bilirubin, UA: NEGATIVE
Glucose, UA: NEGATIVE
Leukocytes, UA: NEGATIVE
Nitrite, UA: NEGATIVE
pH, UA: 6.5

## 2013-07-31 MED ORDER — KETOROLAC TROMETHAMINE 10 MG PO TABS: 10.0000 mg | ORAL_TABLET | Freq: Four times a day (QID) | ORAL | Status: DC | PRN

## 2013-07-31 MED ORDER — KETOROLAC TROMETHAMINE 30 MG/ML IJ SOLN
30.0000 mg | Freq: Once | INTRAMUSCULAR | Status: AC
  Administered 2013-07-31: 30 mg via INTRAMUSCULAR

## 2013-07-31 NOTE — Progress Notes (Signed)
Subjective:    Patient ID: Patricia Kaiser, female    DOB: 14-May-1956, 57 y.o.   MRN: [id number]  HPI Patient presents today with complaints of left flank pain, which is similar to her previous kidney stone pain. Reports being told that she has several left renal stones. Also reports she has ureter implants and was to follow up with a urologist at [facility], but chose not to do so. Patient reports the onset of her pain was yesterday and gradually worsened.     Review of Systems  Constitutional: Negative for fever and chills.  Respiratory: Negative for chest tightness and shortness of breath.   Cardiovascular: Negative for chest pain.  Gastrointestinal: Positive for abdominal pain.  Genitourinary: Negative for hematuria.  Neurological: Negative for dizziness, weakness and headaches.       Objective:   Physical Exam  Constitutional: She is oriented to person, place, and time. She appears well-developed and well-nourished.  Cardiovascular: Regular rhythm and normal heart sounds.   Pulmonary/Chest: Effort normal and breath sounds normal. No respiratory distress. She exhibits no tenderness.  Abdominal: Soft. Bowel sounds are normal. She exhibits no distension. There is tenderness in the left lower quadrant. There is no CVA tenderness.  Neurological: She is alert and oriented to person, place, and time.  Skin: Skin is warm and dry.  Psychiatric: She has a normal mood and affect.      Results for orders placed in visit on 07/31/13  POCT URINALYSIS DIPSTICK      Result Value Range   Color, UA yellow     Clarity, UA clear     Glucose, UA neg     Bilirubin, UA neg     Ketones, UA neg     Spec Grav, UA <=1.005     Blood, UA trace     pH, UA 6.5     Protein, UA neg     Urobilinogen, UA negative     Nitrite, UA neg     Leukocytes, UA Negative    POCT UA - MICROSCOPIC ONLY      Result Value Range   WBC, Ur, HPF, POC neg     RBC, urine, microscopic 5-10     Bacteria, U Microscopic neg      Mucus, UA neg     Epithelial cells, urine per micros occ     Crystals, Ur, HPF, POC neg     Casts, Ur, LPF, POC neg     Yeast, UA neg     [facility] reading (PRIMARY) by Coralie Keens, FNP-C, moderate amount of stool in colon.                                     Assessment & Plan:  1. Dysuria  - POCT urinalysis dipstick - POCT UA - Microscopic Only  2. Flank pain  - DG Abd 1 View; Future - CT Abdomen Pelvis Wo Contrast; Future - ketorolac (TORADOL) 30 MG/ML injection 30 mg; Inject 1 mL (30 mg total) into the muscle once. - ketorolac (TORADOL) 10 MG tablet; Take 1 tablet (10 mg total) by mouth every 6 (six) hours as needed for pain.  Dispense: 20 tablet; Refill: 0  3. Abdominal pain  - DG Abd 1 View; Future - CT Abdomen Pelvis Wo Contrast; Future (3mm, non obstructing right renal stone noted on CT scan, patient aware)  4. Renal calculus or stone  - ketorolac (  TORADOL) 10 MG tablet; Take 1 tablet (10 mg total) by mouth every 6 (six) hours as needed for pain.  Dispense: 20 tablet; Refill: 0 - Ambulatory referral to Urology -increase fluids, to help passing of stone -may seek emergency medical attention  -Please follow up with urologist, due to reported extensive procedures -Patient verbalized understanding Coralie Keens, FNP-C

## 2013-08-04 ENCOUNTER — Encounter (HOSPITAL_COMMUNITY): Payer: Self-pay | Admitting: Emergency Medicine

## 2013-08-04 ENCOUNTER — Emergency Department (HOSPITAL_COMMUNITY)
Admission: EM | Admit: 2013-08-04 | Discharge: 2013-08-04 | Disposition: A | Discharge status: Home / Self Care | Payer: BC Managed Care – PPO | Attending: Emergency Medicine | Admitting: Emergency Medicine

## 2013-08-04 DIAGNOSIS — Z90710 Acquired absence of both cervix and uterus: Secondary | ICD-10-CM | POA: Insufficient documentation

## 2013-08-04 DIAGNOSIS — E119 Type 2 diabetes mellitus without complications: Secondary | ICD-10-CM | POA: Insufficient documentation

## 2013-08-04 DIAGNOSIS — N12 Tubulo-interstitial nephritis, not specified as acute or chronic: Secondary | ICD-10-CM | POA: Insufficient documentation

## 2013-08-04 DIAGNOSIS — I10 Essential (primary) hypertension: Secondary | ICD-10-CM | POA: Insufficient documentation

## 2013-08-04 DIAGNOSIS — Z79899 Other long term (current) drug therapy: Secondary | ICD-10-CM | POA: Insufficient documentation

## 2013-08-04 DIAGNOSIS — F17200 Nicotine dependence, unspecified, uncomplicated: Secondary | ICD-10-CM | POA: Insufficient documentation

## 2013-08-04 DIAGNOSIS — J449 Chronic obstructive pulmonary disease, unspecified: Secondary | ICD-10-CM | POA: Insufficient documentation

## 2013-08-04 DIAGNOSIS — J4489 Other specified chronic obstructive pulmonary disease: Secondary | ICD-10-CM | POA: Insufficient documentation

## 2013-08-04 DIAGNOSIS — Z880 Allergy status to penicillin: Secondary | ICD-10-CM | POA: Insufficient documentation

## 2013-08-04 LAB — URINE MICROSCOPIC-ADD ON

## 2013-08-04 LAB — URINALYSIS, ROUTINE W REFLEX MICROSCOPIC
Glucose, UA: NEGATIVE mg/dL
Ketones, ur: NEGATIVE mg/dL
Nitrite: NEGATIVE
Protein, ur: NEGATIVE mg/dL
Urobilinogen, UA: 0.2 mg/dL (ref 0.0–1.0)

## 2013-08-04 MED ORDER — HYDROCODONE-ACETAMINOPHEN 5-325 MG PO TABS: 1.0000 | ORAL_TABLET | Freq: Four times a day (QID) | ORAL | Status: DC | PRN

## 2013-08-04 MED ORDER — LORAZEPAM 2 MG/ML IJ SOLN
1.0000 mg | Freq: Once | INTRAMUSCULAR | Status: AC
  Administered 2013-08-04: 1 mg via INTRAVENOUS
  Filled 2013-08-04: qty 1

## 2013-08-04 MED ORDER — ONDANSETRON HCL 4 MG/2ML IJ SOLN
4.0000 mg | Freq: Once | INTRAMUSCULAR | Status: AC
  Administered 2013-08-04: 4 mg via INTRAVENOUS
  Filled 2013-08-04: qty 2

## 2013-08-04 MED ORDER — SODIUM CHLORIDE 0.9 % IV SOLN
INTRAVENOUS | Status: DC
  Administered 2013-08-04: 11:00:00 via INTRAVENOUS

## 2013-08-04 MED ORDER — CEPHALEXIN 500 MG PO CAPS: 500.0000 mg | ORAL_CAPSULE | Freq: Four times a day (QID) | ORAL | Status: DC

## 2013-08-04 MED ORDER — DEXTROSE 5 % IV SOLN
1.0000 g | INTRAVENOUS | Status: DC
  Administered 2013-08-04: 1 g via INTRAVENOUS
  Filled 2013-08-04: qty 10

## 2013-08-04 MED ORDER — ONDANSETRON HCL 8 MG PO TABS: 8.0000 mg | ORAL_TABLET | Freq: Three times a day (TID) | ORAL | Status: DC | PRN

## 2013-08-04 MED ORDER — HYDROMORPHONE HCL PF 1 MG/ML IJ SOLN
1.0000 mg | Freq: Once | INTRAMUSCULAR | Status: AC
  Administered 2013-08-04: 1 mg via INTRAVENOUS
  Filled 2013-08-04: qty 1

## 2013-08-04 NOTE — ED Notes (Signed)
Here for sharp left flank pain radiates to mid abdomen but her 3mm kidney is on her right side according to the patient. Also present with urinary frequently, hematuria, suprapubic tenderness. CVA tenderness on the left flank. Pt ambulatory, alert, oriented. VSS.

## 2013-08-04 NOTE — ED Provider Notes (Signed)
CSN: [id number]     Arrival date & time 08/04/13  1018 History   First MD Initiated Contact with Patient 08/04/13 1050     Chief Complaint  Patient presents with  . Flank Pain  . Abdominal Pain   (Consider location/radiation/quality/duration/timing/severity/associated sxs/prior Treatment) Patient is a 57 y.o. female presenting with flank pain and abdominal pain. The history is provided by the patient.  Flank Pain Associated symptoms include abdominal pain. Pertinent negatives include no chest pain, no headaches and no shortness of breath.  Abdominal Pain Associated symptoms: nausea   Associated symptoms: no chest pain, no chills, no constipation, no diarrhea, no fever, no shortness of breath, no sore throat, no vaginal bleeding, no vaginal discharge and no vomiting   pt c/o left flank pain for the past several days. Constant. Dull. Feels in left flank posteriorly and left abd. No radicular pain or leg pain. No pleuritic pain. Nausea. No vomiting or diarrhea. No hematuria or dysuria. Hx kidney stones. Denies any cp or sob. No uri c/o. No fever or chills. Denies back or flank strain or injury. Prior surgical hx includes ureteral/bladder reimplant surgery approx 25 yrs ago ('due to blockage from endometriosis'), and hysterectomy.      Past Medical History  Diagnosis Date  . Ureteral stent retained   . Hypertension     RENAL DOPPLER, 10/01/2010 - 1-59% diameter reduction of the R. renal artery  . Non-insulin dependent type 2 diabetes mellitus   . COPD (chronic obstructive pulmonary disease)   . Shortness of breath     MET TEST, 06/26/2012  . Chest pain     2D ECHO, 08/25/2010 - EF->55%, normal  . DOE (dyspnea on exertion)     NUC, 08/25/2010 - low risk scan, normal  . Tobacco abuse    Past Surgical History  Procedure Laterality Date  . Cesarean section  1980/1984  . Partial hysterectomy  1987  . Total abdominal hysterectomy  1989    with Ureter implant/redo implant in 1990   Family  History  Problem Relation Age of Onset  . COPD Mother   . Lung cancer Mother   . Arrhythmia Mother     Atrial Fibrillation  . COPD Father   . Emphysema Father   . Heart failure Father   . Diabetes Sister   . Hyperlipidemia Sister   . Hyperlipidemia Brother   . Heart attack Maternal Grandfather   . Stroke Paternal Grandfather   . Diabetes Sister   . Heart murmur Daughter    History  Substance Use Topics  . Smoking status: Current Every Day Smoker -- 0.25 packs/day for 30 years    Types: Cigarettes  . Smokeless tobacco: Never Used  . Alcohol Use: Yes     Comment: Occasionally, not very often   OB History   Grav Para Term Preterm Abortions TAB SAB Ect Mult Living                 Review of Systems  Constitutional: Negative for fever and chills.  HENT: Negative for sore throat.   Eyes: Negative for redness.  Respiratory: Negative for shortness of breath.   Cardiovascular: Negative for chest pain.  Gastrointestinal: Positive for nausea and abdominal pain. Negative for vomiting, diarrhea and constipation.  Genitourinary: Positive for flank pain. Negative for vaginal bleeding and vaginal discharge.  Musculoskeletal: Positive for back pain. Negative for neck pain.  Skin: Negative for rash.  Neurological: Negative for headaches.  Hematological: Does not bruise/bleed easily.  Psychiatric/Behavioral:  Negative for confusion.    Allergies  Sulfa antibiotics; Codeine; Erythromycin; Gabapentin; Metformin and related; Symbicort; and Penicillins  Home Medications   Current Outpatient Rx  Name  Route  Sig  Dispense  Refill  . acetaminophen (TYLENOL) 500 MG tablet   Oral   Take 1,000 mg by mouth every 6 (six) hours as needed for pain.         Marland Kitchen albuterol (PROVENTIL HFA;VENTOLIN HFA) 108 (90 BASE) MCG/ACT inhaler   Inhalation   Inhale 2 puffs into the lungs every 6 (six) hours as needed. For difficulty breathing   1 Inhaler   0   . atorvastatin (LIPITOR) 20 MG tablet    Oral   Take 1 tablet (20 mg total) by mouth daily.   30 tablet   3   . co-enzyme Q-10 30 MG capsule   Oral   Take 30 mg by mouth at bedtime.         Marland Kitchen estrogens, conjugated, (PREMARIN) 1.25 MG tablet   Oral   Take 1 tablet (1.25 mg total) by mouth daily.   90 tablet   3   . glipiZIDE (GLUCOTROL) 5 MG tablet   Oral   Take 1 tablet (5 mg total) by mouth 2 (two) times daily before a meal.   60 tablet   3   . ketorolac (TORADOL) 10 MG tablet   Oral   Take 1 tablet (10 mg total) by mouth every 6 (six) hours as needed for pain.   20 tablet   0   . lisinopril (PRINIVIL,ZESTRIL) 5 MG tablet   Oral   Take 1 tablet (5 mg total) by mouth daily.   30 tablet   3   . OVER THE COUNTER MEDICATION   Oral   Take 1 tablet by mouth daily. Hair, Skin and Nails          BP 144/78  Pulse 76  Temp(Src) 98.4 F (36.9 C) (Oral)  Resp 20  SpO2 95% Physical Exam  Nursing note and vitals reviewed. Constitutional: She appears well-developed and well-nourished. No distress.  HENT:  Mouth/Throat: Oropharynx is clear and moist.  Eyes: Conjunctivae are normal. No scleral icterus.  Neck: Neck supple. No tracheal deviation present.  Cardiovascular: Normal rate, regular rhythm, normal heart sounds and intact distal pulses.   Pulmonary/Chest: Effort normal and breath sounds normal. No respiratory distress.  Abdominal: Soft. Normal appearance and bowel sounds are normal. She exhibits no distension and no mass. There is no tenderness. There is no rebound and no guarding.  No incarc hernia.   Genitourinary:  No cva tenderness  Musculoskeletal: She exhibits no edema.  TLS spine, non tender, aligned, no step off.   Neurological: She is alert.  Skin: Skin is warm and dry. No rash noted.  No shingles/rash in area of pain  Psychiatric: She has a normal mood and affect.    ED Course  Procedures (including critical care time) L EKG Interpretation   None      Results for orders placed  during the hospital encounter of 08/04/13  URINE CULTURE      Result Value Range   Specimen Description URINE, CLEAN CATCH     Special Requests NONE     Culture  Setup Time       Value: 08/04/2013 19:30     Performed at [facility] Count       Value: >=100,000 COLONIES/ML     Performed at [facility]  Culture       Value: GROUP B STREP(S.AGALACTIAE)ISOLATED     Note: TESTING AGAINST S. AGALACTIAE NOT ROUTINELY PERFORMED DUE TO PREDICTABILITY OF AMP/PEN/VAN SUSCEPTIBILITY.     Performed at [facility]   Report Status 08/06/2013 FINAL    URINALYSIS, ROUTINE W REFLEX MICROSCOPIC      Result Value Range   Color, Urine YELLOW  YELLOW   APPearance CLOUDY (*) CLEAR   Specific Gravity, Urine 1.009  1.005 - 1.030   pH 6.0  5.0 - 8.0   Glucose, UA NEGATIVE  NEGATIVE mg/dL   Hgb urine dipstick MODERATE (*) NEGATIVE   Bilirubin Urine NEGATIVE  NEGATIVE   Ketones, ur NEGATIVE  NEGATIVE mg/dL   Protein, ur NEGATIVE  NEGATIVE mg/dL   Urobilinogen, UA 0.2  0.0 - 1.0 mg/dL   Nitrite NEGATIVE  NEGATIVE   Leukocytes, UA LARGE (*) NEGATIVE  URINE MICROSCOPIC-ADD ON      Result Value Range   Squamous Epithelial / LPF RARE  RARE   WBC, UA TOO NUMEROUS TO COUNT  <3 WBC/hpf   RBC / HPF 11-20  <3 RBC/hpf   Bacteria, UA MANY (*) RARE   Ct Abdomen Pelvis Wo Contrast  07/31/2013   CLINICAL DATA:  Abdomen and left flank pain, history of kidney stones  EXAM: CT ABDOMEN AND PELVIS WITHOUT CONTRAST  TECHNIQUE: Multidetector CT imaging of the abdomen and pelvis was performed following the standard protocol without intravenous contrast.  COMPARISON:  None.  FINDINGS: The lung bases are clear. The liver is unremarkable in the unenhanced state. A single calcified granuloma is noted in the left lobe of liver. The gallbladder is contracted and no calcified gallstones are seen. The pancreas is unremarkable and the pancreatic duct is not dilated. The adrenal glands and spleen  appear normal. The stomach is moderately distended with food debris with no abnormality noted. Only a single 3 mm nonobstructing right renal calculus is seen. No left renal calculi are noted. The ureters are normal in caliber. The abdominal aorta is normal in caliber with moderate atheromatous change present. No adenopathy is seen.  The distal ureters are normal in caliber and no distal ureteral calculus is seen. The urinary bladder is moderately urine distended with no abnormality noted. The uterus has previously been resected. No adnexal lesion is seen. No fluid is noted within the pelvis. No abnormality of the colon is seen. The appendix is well visualized in the right lower quadrant as is the terminal ileum, both of which appear normal. No bony abnormality is seen.  IMPRESSION: 1. Single nonobstructing 3 mm right renal calculus. No hydronephrosis. 2. The appendix and terminal ileum appear normal.   Electronically Signed   By: Dwyane Dee M.D.   On: 07/31/2013 13:33   Dg Abd 1 View  07/31/2013   CLINICAL DATA:  Flank and abdominal pain, side not specified  EXAM: ABDOMEN - 1 VIEW  COMPARISON:  None  FINDINGS: Normal bowel gas pattern.  No bowel dilatation or bowel wall thickening.  No urinary tract calcification identified.  Surgical clips in pelvis bilaterally.  Osseous structures unremarkable.  IMPRESSION: No acute osseous abnormalities.   Electronically Signed   By: Ulyses Southward M.D.   On: 07/31/2013 12:47      MDM  Iv ns. Dilaudid 1 mg iv. zofran iv.   Pt appears anxious as well. Ativan .5 mg iv.  Reviewed nursing notes and prior charts for additional history.   Reviewed visit to provider 4 days  ago, had labs and ct abd/pelvis neg for acute process - small right ureteral stone, no left sided stones or other acute process.  ua pos. Rocephin iv. Iv fluids. Pt taking po well. abd soft nt. Pt feels improved. Appears stable for d/c.     Suzi Roots, MD 08/06/13 [phone]

## 2013-08-04 NOTE — ED Notes (Signed)
Pt c/o lt flank pain radiating to mid abd x 6 days.  Went to hospital in [place] and was told that she has a kidney stone on the right side.  Hx of kidney stones.  Denies NVD.

## 2013-08-06 LAB — URINE CULTURE

## 2013-11-06 ENCOUNTER — Ambulatory Visit (INDEPENDENT_AMBULATORY_CARE_PROVIDER_SITE_OTHER): Payer: BC Managed Care – PPO | Admitting: Nurse Practitioner

## 2013-11-06 VITALS — BP 163/95 | HR 75 | Temp 96.5°F

## 2013-11-06 DIAGNOSIS — Z202 Contact with and (suspected) exposure to infections with a predominantly sexual mode of transmission: Secondary | ICD-10-CM

## 2013-11-06 DIAGNOSIS — A5901 Trichomonal vulvovaginitis: Secondary | ICD-10-CM

## 2013-11-06 DIAGNOSIS — N898 Other specified noninflammatory disorders of vagina: Secondary | ICD-10-CM

## 2013-11-06 LAB — POCT WET PREP (WET MOUNT): CLUE CELLS WET PREP WHIFF POC: NEGATIVE

## 2013-11-06 MED ORDER — METRONIDAZOLE 500 MG PO TABS: 500.0000 mg | ORAL_TABLET | Freq: Three times a day (TID) | ORAL | Status: DC

## 2013-11-06 NOTE — Progress Notes (Signed)
   Subjective:    Patient ID: Patricia Kaiser, female    DOB: 25-Oct-1955, 58 y.o.   MRN: [id number]  HPI Patient in very anxious and tearful- she says that her husband has been cheating on her with prostitutes- she just found out less than 2 hours ago- This has been going on for about 8-9 months- she has only had sex with him very infrequently.    Review of Systems  Constitutional: Negative.   HENT: Negative.   Respiratory: Negative.   Cardiovascular: Negative.   Gastrointestinal: Negative.   Genitourinary: Positive for vaginal discharge.  All other systems reviewed and are negative.       Objective:   Physical Exam  Constitutional: She appears well-developed and well-nourished.  Cardiovascular: Normal rate, regular rhythm and normal heart sounds.   Pulmonary/Chest: Effort normal and breath sounds normal.  Genitourinary:  Self wet prep  Neurological: She is alert.  Psychiatric:  Very tearful throughout exam    BP 163/95  Pulse 75  Temp(Src) 96.5 F (35.8 C) (Oral)       Assessment & Plan:   1. Possible exposure to STD   2. Vaginal discharge   3. Trichomonal vaginitis    Meds ordered this encounter  Medications  . metroNIDAZOLE (FLAGYL) 500 MG tablet    Sig: Take 1 tablet (500 mg total) by mouth 3 (three) times daily.    Dispense:  21 tablet    Refill:  0    Order Specific Question:  Supervising Provider    Answer:  Ernestina PennaMOORE, DONALD W [[id number]]  Said she is not going to need rx- encouraged her to pick up Patient wallked out prior to getting AVS Says she is going to kill herself- authorities notified  Mary-Margaret Daphine DeutscherMartin, FNP

## 2013-11-07 LAB — STD PANEL
HIV 1/O/2 Abs-Index Value: <1 (ref <1.00)
HIV-1/HIV-2 Ab: NONREACTIVE
Hepatitis B Surface Ag: NEGATIVE
RPR: NONREACTIVE

## 2013-11-08 LAB — GC/CHLAMYDIA PROBE AMP
Chlamydia trachomatis, NAA: NEGATIVE
Neisseria gonorrhoeae by PCR: NEGATIVE

## 2013-11-09 ENCOUNTER — Telehealth: Payer: Self-pay | Admitting: Nurse Practitioner

## 2013-11-09 ENCOUNTER — Telehealth: Payer: Self-pay | Admitting: *Deleted

## 2013-11-09 NOTE — Telephone Encounter (Signed)
Don't worry about it I TOTALLY UNDERSTAND!!!!!!!!

## 2013-11-09 NOTE — Telephone Encounter (Signed)
Aware of results. 

## 2013-11-21 ENCOUNTER — Other Ambulatory Visit: Payer: Self-pay | Admitting: *Deleted

## 2013-11-21 DIAGNOSIS — I10 Essential (primary) hypertension: Secondary | ICD-10-CM

## 2013-11-21 MED ORDER — LISINOPRIL 5 MG PO TABS
5.0000 mg | ORAL_TABLET | Freq: Every day | ORAL | Status: DC
Start: 2013-11-21 — End: 2014-03-27

## 2013-12-18 ENCOUNTER — Other Ambulatory Visit: Payer: Self-pay | Admitting: *Deleted

## 2013-12-18 DIAGNOSIS — E785 Hyperlipidemia, unspecified: Secondary | ICD-10-CM

## 2013-12-18 MED ORDER — ATORVASTATIN CALCIUM 20 MG PO TABS: 20.0000 mg | ORAL_TABLET | Freq: Every day | ORAL | Status: DC

## 2014-03-07 ENCOUNTER — Other Ambulatory Visit: Payer: Self-pay | Admitting: *Deleted

## 2014-03-07 DIAGNOSIS — E119 Type 2 diabetes mellitus without complications: Secondary | ICD-10-CM

## 2014-03-07 MED ORDER — GLIPIZIDE 5 MG PO TABS: 5.0000 mg | ORAL_TABLET | Freq: Two times a day (BID) | ORAL | Status: DC

## 2014-03-07 NOTE — Telephone Encounter (Signed)
Patient NTBS for follow up and lab work  

## 2014-03-07 NOTE — Telephone Encounter (Signed)
Patients last HgB A1C was done on 07-16-13. Please advise on refill

## 2014-03-27 ENCOUNTER — Other Ambulatory Visit: Payer: Self-pay | Admitting: Nurse Practitioner

## 2014-03-28 NOTE — Telephone Encounter (Signed)
Last seen 11/06/13 MMM Last lipid 06/26/13

## 2014-03-29 NOTE — Telephone Encounter (Signed)
lmtcb make appointment

## 2014-03-29 NOTE — Telephone Encounter (Signed)
wnet ahead and refilled both X1 .Patient NTBS for follow up and lab work

## 2014-04-05 ENCOUNTER — Other Ambulatory Visit: Payer: Self-pay | Admitting: Nurse Practitioner

## 2014-04-08 NOTE — Telephone Encounter (Signed)
No labs since 10/2013

## 2014-04-29 ENCOUNTER — Other Ambulatory Visit: Payer: Self-pay | Admitting: Nurse Practitioner

## 2014-04-30 NOTE — Telephone Encounter (Signed)
No more refills without being seen 

## 2014-04-30 NOTE — Telephone Encounter (Signed)
Last ov 11/06/13. Last lipids 06/26/13. ntbs.

## 2014-05-06 ENCOUNTER — Other Ambulatory Visit: Payer: Self-pay | Admitting: Nurse Practitioner

## 2014-05-07 NOTE — Telephone Encounter (Signed)
Patient NTBS for follow up and lab work  

## 2014-05-17 ENCOUNTER — Other Ambulatory Visit: Payer: Self-pay | Admitting: Family Medicine

## 2014-05-17 ENCOUNTER — Other Ambulatory Visit: Payer: Self-pay | Admitting: Internal Medicine

## 2014-05-21 ENCOUNTER — Other Ambulatory Visit: Payer: Self-pay | Admitting: Family Medicine

## 2014-05-22 ENCOUNTER — Other Ambulatory Visit: Payer: Self-pay | Admitting: Nurse Practitioner

## 2014-05-22 NOTE — Telephone Encounter (Signed)
Do not see on current med list. Please advise 

## 2014-05-24 NOTE — Telephone Encounter (Signed)
Last seen 11/06/13  MMM

## 2014-05-24 NOTE — Telephone Encounter (Signed)
no more refills without being seen  

## 2014-06-13 ENCOUNTER — Other Ambulatory Visit: Payer: Self-pay | Admitting: Nurse Practitioner

## 2014-06-14 NOTE — Telephone Encounter (Signed)
Last seen 11/06/13  MMM  Last glucose 06/26/13

## 2014-06-17 NOTE — Telephone Encounter (Signed)
Patient NTBS for follow up and lab work  

## 2014-06-29 ENCOUNTER — Other Ambulatory Visit: Payer: Self-pay | Admitting: Nurse Practitioner

## 2014-08-02 ENCOUNTER — Other Ambulatory Visit: Payer: Self-pay | Admitting: Nurse Practitioner

## 2014-09-20 ENCOUNTER — Other Ambulatory Visit: Payer: Self-pay | Admitting: Nurse Practitioner

## 2014-09-25 IMAGING — CR DG CHEST 2V
2 series · 2 of 2 positions shown · non-contrast
Comparison: 10/20/2011

CLINICAL DATA: Wheezing with shortness of breath and cough.

CHEST - 2 VIEW

[w chest pa]
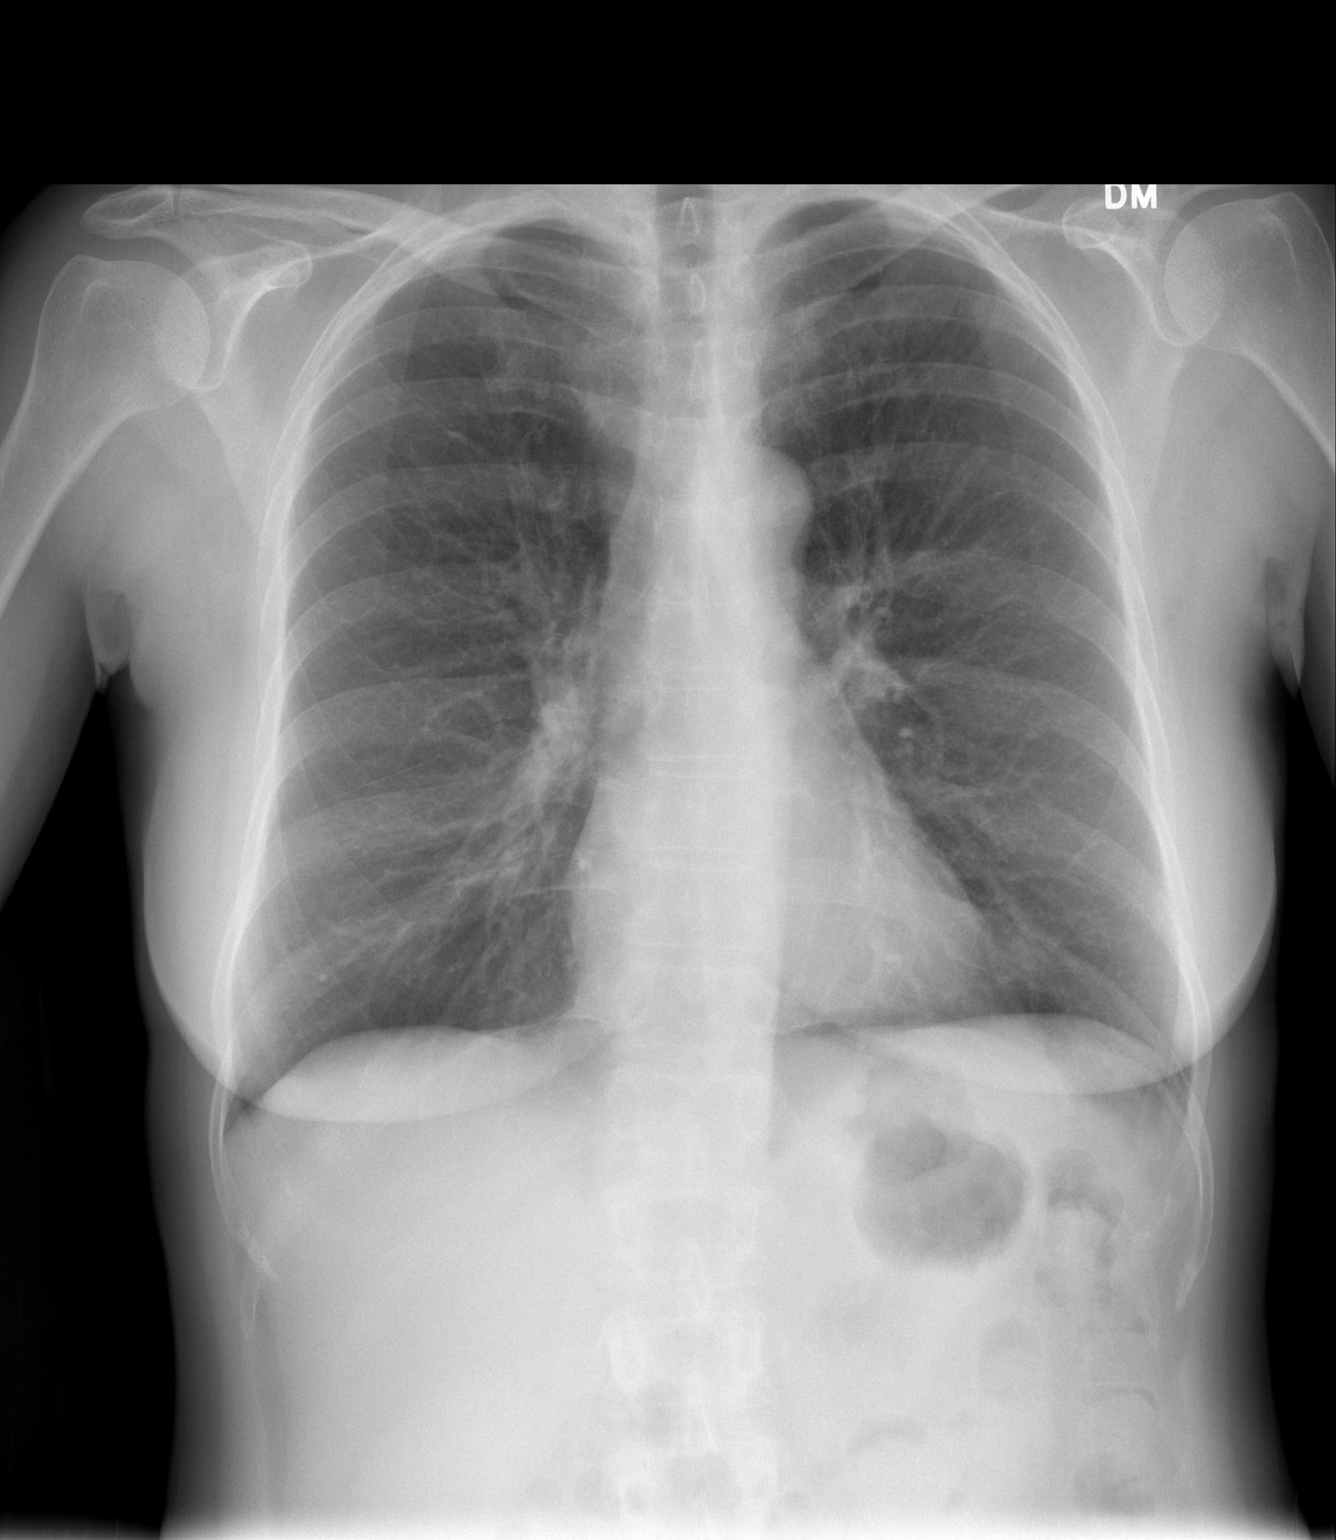

[w chest lat]
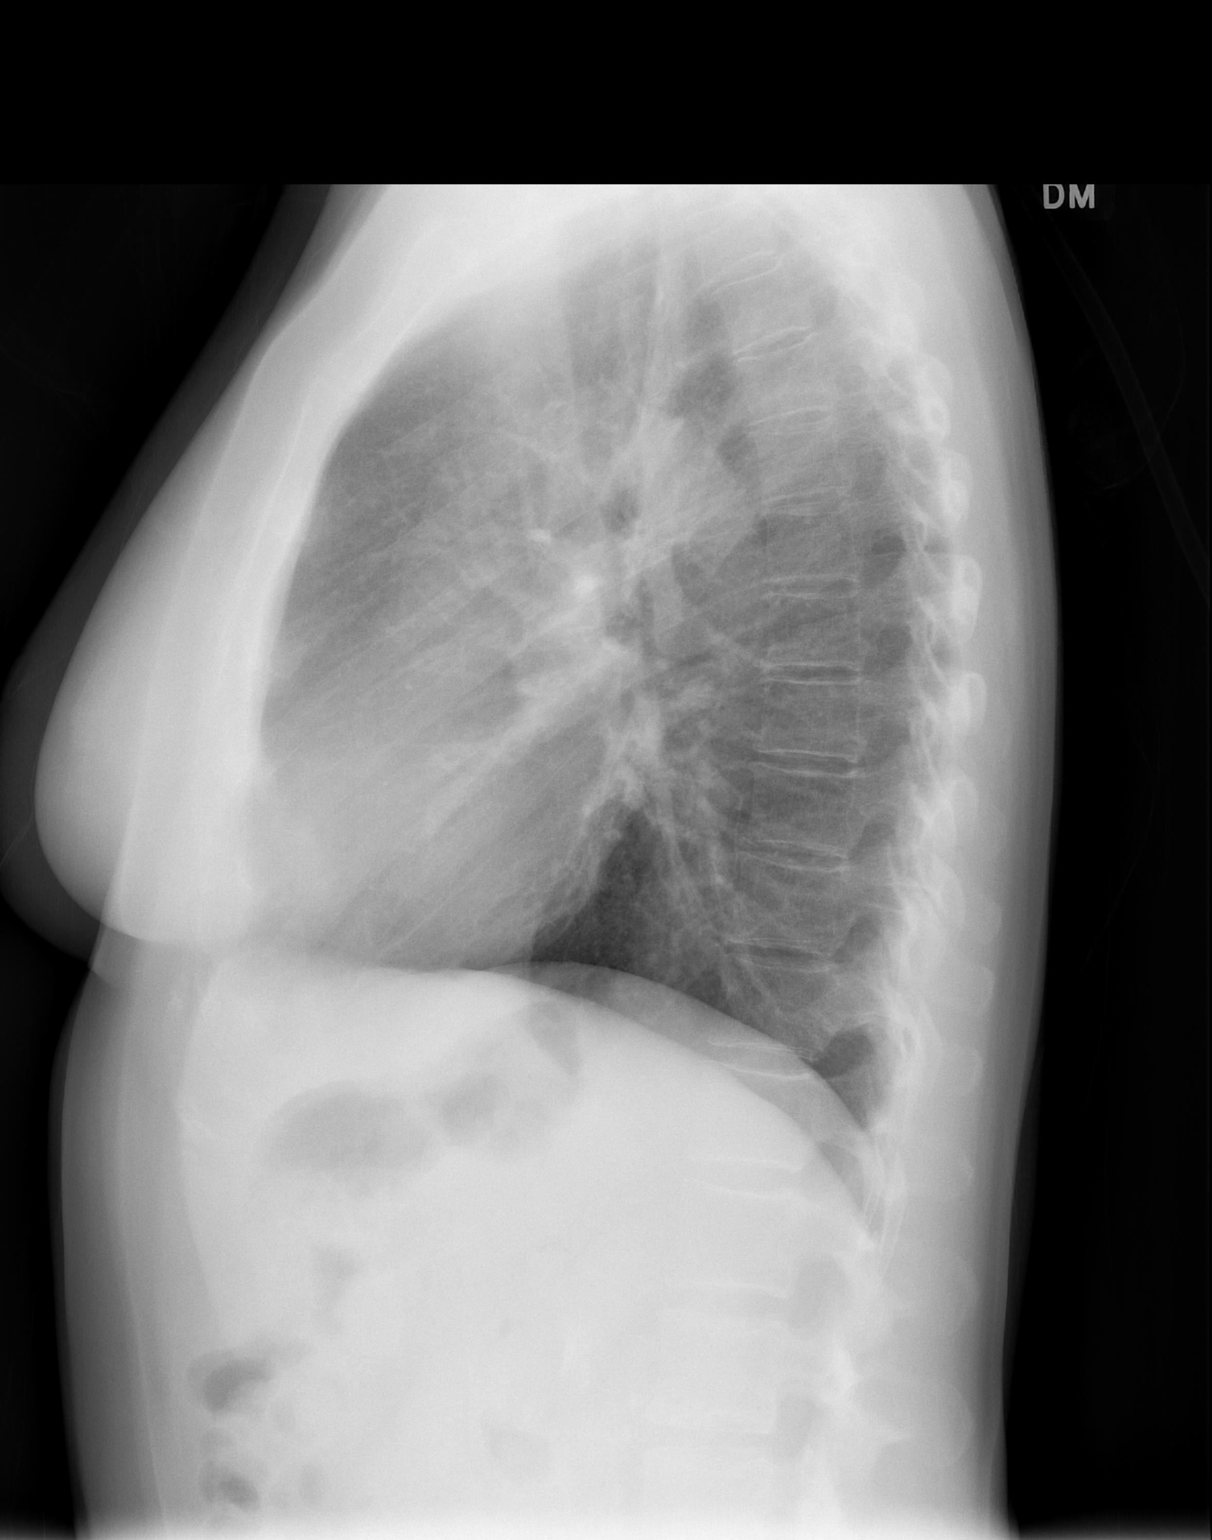

[2 of 2 positions shown; findings below may reference images not displayed]

FINDINGS: Two views of the chest demonstrate clear lungs. Heart and
mediastinum are within normal limits.  The trachea is midline.
Bony thorax is intact. There is a stable small calcification at the
right lung base.
IMPRESSION: Stable chest radiograph findings.  No acute cardiopulmonary
disease.

## 2014-11-05 ENCOUNTER — Other Ambulatory Visit: Payer: Self-pay | Admitting: Nurse Practitioner

## 2016-07-27 DIAGNOSIS — Z794 Long term (current) use of insulin: Secondary | ICD-10-CM | POA: Insufficient documentation

## 2016-07-27 DIAGNOSIS — E119 Type 2 diabetes mellitus without complications: Secondary | ICD-10-CM | POA: Insufficient documentation

## 2017-01-27 ENCOUNTER — Telehealth: Payer: Self-pay

## 2017-01-27 NOTE — Telephone Encounter (Signed)
SENT NOTES TO SCHEDULING 

## 2017-02-03 ENCOUNTER — Telehealth: Payer: Self-pay | Admitting: Cardiovascular Disease

## 2017-02-03 NOTE — Telephone Encounter (Signed)
Received records from [facility] for appointment on 02/04/17 with Dr Allyson Sabal.  Records put with Dr Hazle Coca schedule for 02/04/17. lp

## 2017-02-04 ENCOUNTER — Encounter: Payer: Self-pay | Admitting: Cardiovascular Disease

## 2017-02-04 ENCOUNTER — Ambulatory Visit (INDEPENDENT_AMBULATORY_CARE_PROVIDER_SITE_OTHER): Payer: BLUE CROSS/BLUE SHIELD | Admitting: Cardiovascular Disease

## 2017-02-04 VITALS — BP 120/80 | HR 71 | Ht 62.0 in | Wt 125.0 lb

## 2017-02-04 DIAGNOSIS — I10 Essential (primary) hypertension: Secondary | ICD-10-CM | POA: Diagnosis not present

## 2017-02-04 DIAGNOSIS — R0600 Dyspnea, unspecified: Secondary | ICD-10-CM

## 2017-02-04 NOTE — Progress Notes (Signed)
02/04/2017 Patricia Kaiser   05/31/1956  [id number]  Primary Physician Pcp Not In System Primary Cardiologist: Lorretta Harp MD Lupe Carney, [place]  HPI:  The patient is a 61 year old thin-appearing married Caucasian female, mother of 2 who I last saw in the office 06/11/13 . She is accompanied by her husband today. She has a history of hypertension, hyperlipidemia, non-insulin-requiring diabetes, and COPD with ongoing tobacco abuse, as well as a strong family history for heart disease. She had a negative nuclear stress test and MET-TEST. She has chronic shortness of breath and COPD that has gone progressively worse. She is on inhaled bronchodilators. Recent lipid profile performed by her PCP for/ 410/18 revealed a total cholesterol 165, LDL 68 and HDL of 63. She is being referred back by her PCP because of an episode of bilateral leg swelling several weeks ago which has since resolved. She does complain of occasional palpitations as well.  Current Outpatient Prescriptions  Medication Sig Dispense Refill  . acetaminophen (TYLENOL) 500 MG tablet Take 1,000 mg by mouth every 6 (six) hours as needed for pain.    Marland Kitchen albuterol (PROVENTIL) (2.5 MG/3ML) 0.083% nebulizer solution USE 1 VIAL IN NEBULE EVERY 6 HOURS 75 mL 0  . atorvastatin (LIPITOR) 20 MG tablet TAKE 1 TABLET (20 MG TOTAL) BY MOUTH DAILY. 30 tablet 0  . cephALEXin (KEFLEX) 500 MG capsule Take 1 capsule (500 mg total) by mouth 4 (four) times daily. 28 capsule 0  . co-enzyme Q-10 30 MG capsule Take 30 mg by mouth at bedtime.    Marland Kitchen glipiZIDE (GLUCOTROL) 5 MG tablet TAKE 1 TABLET (5 MG TOTAL) BY MOUTH 2 (TWO) TIMES DAILY BEFORE A MEAL. 60 tablet 0  . HYDROcodone-acetaminophen (NORCO/VICODIN) 5-325 MG per tablet Take 1-2 tablets by mouth every 6 (six) hours as needed for pain. 20 tablet 0  . ketorolac (TORADOL) 10 MG tablet Take 1 tablet (10 mg total) by mouth every 6 (six) hours as needed for pain. 20 tablet 0  . lisinopril  (PRINIVIL,ZESTRIL) 5 MG tablet TAKE 1 TABLET (5 MG TOTAL) BY MOUTH DAILY. 30 tablet 0  . metroNIDAZOLE (FLAGYL) 500 MG tablet Take 1 tablet (500 mg total) by mouth 3 (three) times daily. 21 tablet 0  . ondansetron (ZOFRAN) 8 MG tablet Take 1 tablet (8 mg total) by mouth every 8 (eight) hours as needed for nausea. 10 tablet 0  . OVER THE COUNTER MEDICATION Take 1 tablet by mouth daily. Hair, Skin and Nails    . PREMARIN 1.25 MG tablet TAKE 1 TABLET (1.25 MG TOTAL) BY MOUTH DAILY. 30 tablet 0  . SYMBICORT 160-4.5 MCG/ACT inhaler INHALE 2 PUFFS INTO THE LUNGS 2 (TWO) TIMES DAILY. 10.2 g 1   No current facility-administered medications for this visit.     Allergies  Allergen Reactions  . Sulfa Antibiotics Anaphylaxis and Shortness Of Breath  . Codeine Nausea Only  . Erythromycin Hives  . Gabapentin Nausea And Vomiting    GI upset, pt claims it made her "drunk"  . Metformin And Related Nausea And Vomiting  . Symbicort [Budesonide-Formoterol Fumarate]     Breaks mouth out  . Penicillins Rash    Social History   Social History  . Marital status: Married    Spouse name: N/A  . Number of children: N/A  . Years of education: N/A   Occupational History  . Not on file.   Social History Main Topics  . Smoking status: Current Every Day Smoker  Packs/day: 0.25    Years: 30.00    Types: Cigarettes  . Smokeless tobacco: Never Used  . Alcohol use Yes     Comment: Occasionally, not very often  . Drug use: No  . Sexual activity: Yes   Other Topics Concern  . Not on file   Social History Narrative  . No narrative on file     Review of Systems: General: negative for chills, fever, night sweats or weight changes.  Cardiovascular: negative for chest pain, dyspnea on exertion, edema, orthopnea, palpitations, paroxysmal nocturnal dyspnea or shortness of breath Dermatological: negative for rash Respiratory: negative for cough or wheezing Urologic: negative for hematuria Abdominal:  negative for nausea, vomiting, diarrhea, bright red blood per rectum, melena, or hematemesis Neurologic: negative for visual changes, syncope, or dizziness All other systems reviewed and are otherwise negative except as noted above.    Blood pressure 120/80, pulse 71, height '5\' 2"'$  (1.575 m), weight 125 lb (56.7 kg).  General appearance: alert and no distress Neck: no adenopathy, no carotid bruit, no JVD, supple, symmetrical, trachea midline and thyroid not enlarged, symmetric, no tenderness/mass/nodules Lungs: clear to auscultation bilaterally Heart: regular rate and rhythm, S1, S2 normal, no murmur, click, rub or gallop Extremities: extremities normal, atraumatic, no cyanosis or edema and 2+ pedal pulses  EKG normal sinus rhythm at 71 without ST or T-wave changes. I personally reviewed this EKG.  ASSESSMENT AND PLAN:   Hypertension History of hypertension with blood pressure measured 120/80. She is on lisinopril. Continue current meds at current dosing.  Hyperlipemia History of hyperlipidemia on statin therapy with recent lipid profile performed 01/25/17 revealing a total cholesterol 165, LDL 68 and HDL of 63.      Lorretta Harp MD FACP,FACC,FAHA, [facility] 02/04/2017 2:56 PM

## 2017-02-04 NOTE — Assessment & Plan Note (Signed)
History of hyperlipidemia on statin therapy with recent lipid profile performed 01/25/17 revealing a total cholesterol 165, LDL 68 and HDL of 63.

## 2017-02-04 NOTE — Assessment & Plan Note (Signed)
History of hypertension with blood pressure measured 120/80. She is on lisinopril. Continue current meds at current dosing.

## 2017-02-04 NOTE — Patient Instructions (Signed)
Medication Instructions: Your physician recommends that you continue on your current medications as directed. Please refer to the Current Medication list given to you today.  Testing/Procedures: Your physician has requested that you have an echocardiogram. Echocardiography is a painless test that uses sound waves to create images of your heart. It provides your doctor with information about the size and shape of your heart and how well your heart's chambers and valves are working. This procedure takes approximately one hour. There are no restrictions for this procedure.  Your physician has recommended that you wear a 30 day event monitor. Event monitors are medical devices that record the heart's electrical activity. Doctors most often us these monitors to diagnose arrhythmias. Arrhythmias are problems with the speed or rhythm of the heartbeat. The monitor is a small, portable device. You can wear one while you do your normal daily activities. This is usually used to diagnose what is causing palpitations/syncope (passing out).  Follow-Up: Your physician recommends that you schedule a follow-up appointment in: 2 months with Dr. Berry.  If you need a refill on your cardiac medications before your next appointment, please call your pharmacy.  

## 2017-02-17 ENCOUNTER — Ambulatory Visit (INDEPENDENT_AMBULATORY_CARE_PROVIDER_SITE_OTHER): Payer: BLUE CROSS/BLUE SHIELD

## 2017-02-17 ENCOUNTER — Ambulatory Visit (HOSPITAL_COMMUNITY): Payer: BLUE CROSS/BLUE SHIELD | Attending: Cardiology

## 2017-02-17 ENCOUNTER — Other Ambulatory Visit: Payer: Self-pay

## 2017-02-17 ENCOUNTER — Other Ambulatory Visit: Payer: Self-pay | Admitting: Cardiovascular Disease

## 2017-02-17 DIAGNOSIS — I5030 Unspecified diastolic (congestive) heart failure: Secondary | ICD-10-CM | POA: Insufficient documentation

## 2017-02-17 DIAGNOSIS — R0600 Dyspnea, unspecified: Secondary | ICD-10-CM

## 2017-02-17 DIAGNOSIS — I350 Nonrheumatic aortic (valve) stenosis: Secondary | ICD-10-CM | POA: Insufficient documentation

## 2017-02-17 DIAGNOSIS — R002 Palpitations: Secondary | ICD-10-CM | POA: Diagnosis not present

## 2017-02-21 ENCOUNTER — Telehealth: Payer: Self-pay | Admitting: Cardiovascular Disease

## 2017-02-21 NOTE — Telephone Encounter (Signed)
Echo results given.    Pt states she is having difficulty with the monitor. States she developed an open wound on her chest from the stickers and called [employer]. They are sending her sensitive skin stickers to try and she should receive them on Wednesday. Pt stated she will try to where the monitor sideways, but was unable to get good contact when she tried it before. She is putting ointment on the wound area and is hoping it will be better by Wednesday. I told pt to call back if she does not think she can continue to wear the monitor after Wednesday.

## 2017-02-28 ENCOUNTER — Telehealth: Payer: Self-pay | Admitting: Cardiovascular Disease

## 2017-02-28 NOTE — Telephone Encounter (Signed)
Patient states that she is having issues the heart monitor. She states that she is allergic to the strips and would like to know if she can send the monitor back. Please call to discuss,thanks.

## 2017-02-28 NOTE — Telephone Encounter (Signed)
Tried to call pt x2 phone busy-pt needs to call monitor company and they will send her hypoallergenic leads

## 2017-03-02 NOTE — Telephone Encounter (Signed)
Returned call to patient.  She was allergic to first strips - she states her skin came off with the electrodes when she removed them She waited for her skin to heal and started using sensitive skin strips She has now taken her monitor off Advised if she cannot tolerate the monitor/electrodes, she will need return the monitor to the monitoring company She states there should be about 6-7 days of monitoring info recorded.   Routed to MD as Lorain ChildesFYI

## 2017-03-29 ENCOUNTER — Telehealth: Payer: Self-pay | Admitting: Cardiovascular Disease

## 2017-03-29 NOTE — Telephone Encounter (Signed)
New message ° ° ° °Pt is returning call to Hayley °

## 2017-03-29 NOTE — Telephone Encounter (Signed)
Patient aware of results. Verbalized understanding.  Patient wondering if she needs to come in for follow up appt since her echo and monitor was normal.  States she is not having any symptoms and would like to wait if possible.   Advised I would route to Dr. Allyson SabalBerry to verify.    Patient aware.

## 2017-04-03 NOTE — Telephone Encounter (Signed)
Okay to come back to see me in 6 months.

## 2017-04-04 NOTE — Telephone Encounter (Signed)
Cancelled 6/22 appt and placed recall for 6 mo f/u.   Left message for pt to call back to let her know.

## 2017-04-05 NOTE — Telephone Encounter (Signed)
Returned call to patient, advised on recall and appt cancellation - pt voiced understanding and thanks - aware of 6 mo f/u.

## 2017-04-05 NOTE — Telephone Encounter (Signed)
Follow Up   [facility] Requesting call back

## 2017-04-08 ENCOUNTER — Ambulatory Visit: Payer: BLUE CROSS/BLUE SHIELD | Admitting: Cardiovascular Disease

## 2019-05-17 ENCOUNTER — Other Ambulatory Visit: Payer: Self-pay

## 2019-05-17 ENCOUNTER — Encounter (HOSPITAL_BASED_OUTPATIENT_CLINIC_OR_DEPARTMENT_OTHER): Payer: Self-pay | Admitting: Emergency Medicine

## 2019-05-17 ENCOUNTER — Inpatient Hospital Stay (HOSPITAL_BASED_OUTPATIENT_CLINIC_OR_DEPARTMENT_OTHER)
Admission: EM | Admit: 2019-05-17 | Discharge: 2019-05-21 | DRG: 871 | Disposition: A | Discharge status: Home / Self Care | Payer: BLUE CROSS/BLUE SHIELD | Attending: Student | Admitting: Student

## 2019-05-17 ENCOUNTER — Observation Stay (HOSPITAL_COMMUNITY): Payer: BLUE CROSS/BLUE SHIELD

## 2019-05-17 ENCOUNTER — Emergency Department (HOSPITAL_BASED_OUTPATIENT_CLINIC_OR_DEPARTMENT_OTHER): Payer: BLUE CROSS/BLUE SHIELD

## 2019-05-17 DIAGNOSIS — F17210 Nicotine dependence, cigarettes, uncomplicated: Secondary | ICD-10-CM | POA: Diagnosis not present

## 2019-05-17 DIAGNOSIS — Z880 Allergy status to penicillin: Secondary | ICD-10-CM

## 2019-05-17 DIAGNOSIS — E119 Type 2 diabetes mellitus without complications: Secondary | ICD-10-CM

## 2019-05-17 DIAGNOSIS — N390 Urinary tract infection, site not specified: Secondary | ICD-10-CM | POA: Diagnosis present

## 2019-05-17 DIAGNOSIS — N12 Tubulo-interstitial nephritis, not specified as acute or chronic: Secondary | ICD-10-CM

## 2019-05-17 DIAGNOSIS — B961 Klebsiella pneumoniae [K. pneumoniae] as the cause of diseases classified elsewhere: Secondary | ICD-10-CM | POA: Diagnosis not present

## 2019-05-17 DIAGNOSIS — Z801 Family history of malignant neoplasm of trachea, bronchus and lung: Secondary | ICD-10-CM

## 2019-05-17 DIAGNOSIS — I10 Essential (primary) hypertension: Secondary | ICD-10-CM | POA: Diagnosis not present

## 2019-05-17 DIAGNOSIS — Z833 Family history of diabetes mellitus: Secondary | ICD-10-CM | POA: Diagnosis not present

## 2019-05-17 DIAGNOSIS — Z20828 Contact with and (suspected) exposure to other viral communicable diseases: Secondary | ICD-10-CM | POA: Diagnosis present

## 2019-05-17 DIAGNOSIS — R509 Fever, unspecified: Secondary | ICD-10-CM | POA: Diagnosis not present

## 2019-05-17 DIAGNOSIS — E11649 Type 2 diabetes mellitus with hypoglycemia without coma: Secondary | ICD-10-CM | POA: Diagnosis not present

## 2019-05-17 DIAGNOSIS — Z8249 Family history of ischemic heart disease and other diseases of the circulatory system: Secondary | ICD-10-CM

## 2019-05-17 DIAGNOSIS — Z79899 Other long term (current) drug therapy: Secondary | ICD-10-CM

## 2019-05-17 DIAGNOSIS — Z794 Long term (current) use of insulin: Secondary | ICD-10-CM | POA: Diagnosis not present

## 2019-05-17 DIAGNOSIS — R0902 Hypoxemia: Secondary | ICD-10-CM

## 2019-05-17 DIAGNOSIS — T380X5A Adverse effect of glucocorticoids and synthetic analogues, initial encounter: Secondary | ICD-10-CM | POA: Diagnosis present

## 2019-05-17 DIAGNOSIS — Z7951 Long term (current) use of inhaled steroids: Secondary | ICD-10-CM

## 2019-05-17 DIAGNOSIS — Z823 Family history of stroke: Secondary | ICD-10-CM

## 2019-05-17 DIAGNOSIS — J441 Chronic obstructive pulmonary disease with (acute) exacerbation: Secondary | ICD-10-CM | POA: Diagnosis not present

## 2019-05-17 DIAGNOSIS — J9601 Acute respiratory failure with hypoxia: Secondary | ICD-10-CM | POA: Diagnosis not present

## 2019-05-17 DIAGNOSIS — Z90711 Acquired absence of uterus with remaining cervical stump: Secondary | ICD-10-CM

## 2019-05-17 DIAGNOSIS — Z7984 Long term (current) use of oral hypoglycemic drugs: Secondary | ICD-10-CM | POA: Diagnosis not present

## 2019-05-17 DIAGNOSIS — J449 Chronic obstructive pulmonary disease, unspecified: Secondary | ICD-10-CM | POA: Diagnosis present

## 2019-05-17 DIAGNOSIS — A419 Sepsis, unspecified organism: Principal | ICD-10-CM | POA: Diagnosis present

## 2019-05-17 LAB — CBC WITH DIFFERENTIAL/PLATELET
Abs Immature Granulocytes: 0.09 10*3/uL — ABNORMAL HIGH (ref 0.00–0.07)
Basophils Absolute: 0.1 10*3/uL (ref 0.0–0.1)
Basophils Relative: 0 %
Eosinophils Absolute: 0.1 10*3/uL (ref 0.0–0.5)
Eosinophils Relative: 0 %
HCT: 44.7 % (ref 36.0–46.0)
Hemoglobin: 15.1 g/dL — ABNORMAL HIGH (ref 12.0–15.0)
Immature Granulocytes: 1 %
Lymphocytes Relative: 14 %
Lymphs Abs: 2.4 10*3/uL (ref 0.7–4.0)
MCH: 31.8 pg (ref 26.0–34.0)
MCHC: 33.8 g/dL (ref 30.0–36.0)
MCV: 94.1 fL (ref 80.0–100.0)
Monocytes Absolute: 0.9 10*3/uL (ref 0.1–1.0)
Monocytes Relative: 5 %
Neutro Abs: 14 10*3/uL — ABNORMAL HIGH (ref 1.7–7.7)
Neutrophils Relative %: 80 %
Platelets: 285 10*3/uL (ref 150–400)
RBC: 4.75 MIL/uL (ref 3.87–5.11)
RDW: 12.8 % (ref 11.5–15.5)
WBC: 17.5 10*3/uL — ABNORMAL HIGH (ref 4.0–10.5)
nRBC: 0 % (ref 0.0–0.2)

## 2019-05-17 LAB — URINALYSIS, ROUTINE W REFLEX MICROSCOPIC
Bilirubin Urine: NEGATIVE
Glucose, UA: NEGATIVE mg/dL
Hgb urine dipstick: NEGATIVE
Ketones, ur: NEGATIVE mg/dL
Nitrite: NEGATIVE
Protein, ur: NEGATIVE mg/dL
Specific Gravity, Urine: 1.01 (ref 1.005–1.030)
pH: 7 (ref 5.0–8.0)

## 2019-05-17 LAB — GLUCOSE, CAPILLARY: Glucose-Capillary: 74 mg/dL (ref 70–99)

## 2019-05-17 LAB — CBG MONITORING, ED
Glucose-Capillary: 134 mg/dL — ABNORMAL HIGH (ref 70–99)
Glucose-Capillary: 50 mg/dL — ABNORMAL LOW (ref 70–99)
Glucose-Capillary: 192 mg/dL — ABNORMAL HIGH (ref 70–99)

## 2019-05-17 LAB — URINALYSIS, MICROSCOPIC (REFLEX)

## 2019-05-17 LAB — SARS CORONAVIRUS 2 BY RT PCR (HOSPITAL ORDER, PERFORMED IN ~~LOC~~ HOSPITAL LAB): SARS Coronavirus 2: NEGATIVE

## 2019-05-17 LAB — COMPREHENSIVE METABOLIC PANEL
ALT: 13 U/L (ref 0–44)
AST: 14 U/L — ABNORMAL LOW (ref 15–41)
Albumin: 3.9 g/dL (ref 3.5–5.0)
Alkaline Phosphatase: 77 U/L (ref 38–126)
Anion gap: 12 (ref 5–15)
BUN: 14 mg/dL (ref 8–23)
CO2: 26 mmol/L (ref 22–32)
Calcium: 9 mg/dL (ref 8.9–10.3)
Chloride: 101 mmol/L (ref 98–111)
Creatinine, Ser: 0.7 mg/dL (ref 0.44–1.00)
GFR calc Af Amer: >60 mL/min (ref >60)
GFR calc non Af Amer: >60 mL/min (ref >60)
Glucose, Bld: 97 mg/dL (ref 70–99)
Potassium: 4.1 mmol/L (ref 3.5–5.1)
Sodium: 139 mmol/L (ref 135–145)
Total Bilirubin: 0.5 mg/dL (ref 0.3–1.2)
Total Protein: 6.9 g/dL (ref 6.5–8.1)

## 2019-05-17 LAB — APTT: aPTT: 27 seconds (ref 24–36)

## 2019-05-17 LAB — LACTIC ACID, PLASMA: Lactic Acid, Venous: 1 mmol/L (ref 0.5–1.9)

## 2019-05-17 LAB — PROTIME-INR
INR: 1 (ref 0.8–1.2)
Prothrombin Time: 12.8 seconds (ref 11.4–15.2)

## 2019-05-17 MED ORDER — DEXTROSE 50 % IV SOLN
1.0000 | Freq: Once | INTRAVENOUS | Status: AC
  Administered 2019-05-17: 50 mL via INTRAVENOUS

## 2019-05-17 MED ORDER — ONDANSETRON HCL 4 MG/2ML IJ SOLN
4.0000 mg | Freq: Four times a day (QID) | INTRAMUSCULAR | Status: DC | PRN
  Administered 2019-05-17 – 2019-05-19 (×5): 4 mg via INTRAVENOUS
  Filled 2019-05-17 (×5): qty 2

## 2019-05-17 MED ORDER — SODIUM CHLORIDE 0.9 % IV SOLN
1.0000 g | Freq: Once | INTRAVENOUS | Status: AC
  Administered 2019-05-17: 1 g via INTRAVENOUS
  Filled 2019-05-17: qty 10

## 2019-05-17 MED ORDER — ALBUTEROL SULFATE (2.5 MG/3ML) 0.083% IN NEBU: 2.5000 mg | INHALATION_SOLUTION | Freq: Four times a day (QID) | RESPIRATORY_TRACT | Status: DC | PRN

## 2019-05-17 MED ORDER — MORPHINE SULFATE (PF) 4 MG/ML IV SOLN
4.0000 mg | INTRAVENOUS | Status: DC | PRN
  Administered 2019-05-17 – 2019-05-18 (×2): 4 mg via INTRAVENOUS
  Filled 2019-05-17 (×2): qty 1

## 2019-05-17 MED ORDER — ONDANSETRON HCL 4 MG/2ML IJ SOLN
2.0000 mg | Freq: Once | INTRAMUSCULAR | Status: AC
  Administered 2019-05-17: 2 mg via INTRAVENOUS
  Filled 2019-05-17: qty 2

## 2019-05-17 MED ORDER — ACETAMINOPHEN 650 MG RE SUPP: 650.0000 mg | Freq: Four times a day (QID) | RECTAL | Status: DC | PRN

## 2019-05-17 MED ORDER — SODIUM CHLORIDE 0.9 % IV BOLUS (SEPSIS)
250.0000 mL | Freq: Once | INTRAVENOUS | Status: AC
  Administered 2019-05-17: 250 mL via INTRAVENOUS

## 2019-05-17 MED ORDER — ACETAMINOPHEN 325 MG PO TABS
650.0000 mg | ORAL_TABLET | Freq: Four times a day (QID) | ORAL | Status: DC | PRN
  Administered 2019-05-17 – 2019-05-20 (×7): 650 mg via ORAL
  Filled 2019-05-17 (×7): qty 2

## 2019-05-17 MED ORDER — ONDANSETRON HCL 4 MG/2ML IJ SOLN
4.0000 mg | Freq: Once | INTRAMUSCULAR | Status: AC
  Administered 2019-05-17: 4 mg via INTRAVENOUS
  Filled 2019-05-17: qty 2

## 2019-05-17 MED ORDER — FENTANYL CITRATE (PF) 100 MCG/2ML IJ SOLN
50.0000 ug | Freq: Once | INTRAMUSCULAR | Status: AC
  Administered 2019-05-17: 50 ug via INTRAVENOUS
  Filled 2019-05-17: qty 2

## 2019-05-17 MED ORDER — SODIUM CHLORIDE 0.9 % IV SOLN
INTRAVENOUS | Status: DC | PRN
  Administered 2019-05-17: 250 mL via INTRAVENOUS

## 2019-05-17 MED ORDER — SODIUM CHLORIDE 0.9% FLUSH
3.0000 mL | INTRAVENOUS | Status: DC | PRN
  Administered 2019-05-20: 3 mL via INTRAVENOUS
  Filled 2019-05-17: qty 3

## 2019-05-17 MED ORDER — INSULIN ASPART 100 UNIT/ML ~~LOC~~ SOLN
0.0000 [IU] | Freq: Three times a day (TID) | SUBCUTANEOUS | Status: DC
  Administered 2019-05-18 – 2019-05-19 (×3): 2 [IU] via SUBCUTANEOUS
  Administered 2019-05-20: 3 [IU] via SUBCUTANEOUS

## 2019-05-17 MED ORDER — ATORVASTATIN CALCIUM 10 MG PO TABS
20.0000 mg | ORAL_TABLET | Freq: Every day | ORAL | Status: DC
  Administered 2019-05-18 – 2019-05-20 (×3): 20 mg via ORAL
  Filled 2019-05-17 (×3): qty 2

## 2019-05-17 MED ORDER — SODIUM CHLORIDE 0.9 % IV BOLUS
750.0000 mL | Freq: Once | INTRAVENOUS | Status: AC
  Administered 2019-05-17: 750 mL via INTRAVENOUS

## 2019-05-17 MED ORDER — UMECLIDINIUM BROMIDE 62.5 MCG/INH IN AEPB
1.0000 | INHALATION_SPRAY | Freq: Every day | RESPIRATORY_TRACT | Status: DC
  Administered 2019-05-18 – 2019-05-21 (×4): 1 via RESPIRATORY_TRACT
  Filled 2019-05-17: qty 7

## 2019-05-17 MED ORDER — SODIUM CHLORIDE 0.9 % IV BOLUS: 250.0000 mL | Freq: Once | INTRAVENOUS | Status: AC

## 2019-05-17 MED ORDER — SODIUM CHLORIDE 0.9% FLUSH
3.0000 mL | Freq: Two times a day (BID) | INTRAVENOUS | Status: DC
  Administered 2019-05-17: 3 mL via INTRAVENOUS

## 2019-05-17 MED ORDER — HYDROCODONE-ACETAMINOPHEN 5-325 MG PO TABS
1.0000 | ORAL_TABLET | ORAL | Status: DC | PRN
  Administered 2019-05-19 – 2019-05-20 (×4): 2 via ORAL
  Filled 2019-05-17 (×5): qty 2

## 2019-05-17 MED ORDER — FLUTICASONE FUROATE-VILANTEROL 200-25 MCG/INH IN AEPB
1.0000 | INHALATION_SPRAY | Freq: Every day | RESPIRATORY_TRACT | Status: DC
  Administered 2019-05-18 – 2019-05-21 (×4): 1 via RESPIRATORY_TRACT
  Filled 2019-05-17: qty 28

## 2019-05-17 MED ORDER — ONDANSETRON HCL 4 MG/2ML IJ SOLN
4.0000 mg | Freq: Once | INTRAMUSCULAR | Status: AC
  Administered 2019-05-17: 14:00:00 4 mg via INTRAVENOUS
  Filled 2019-05-17: qty 2

## 2019-05-17 MED ORDER — SODIUM CHLORIDE 0.9 % IV SOLN: 250.0000 mL | INTRAVENOUS | Status: DC | PRN

## 2019-05-17 MED ORDER — ONDANSETRON HCL 4 MG PO TABS: 4.0000 mg | ORAL_TABLET | Freq: Four times a day (QID) | ORAL | Status: DC | PRN

## 2019-05-17 MED ORDER — CHLORHEXIDINE GLUCONATE CLOTH 2 % EX PADS
6.0000 | MEDICATED_PAD | Freq: Every day | CUTANEOUS | Status: DC
  Administered 2019-05-20: 6 via TOPICAL

## 2019-05-17 MED ORDER — PANTOPRAZOLE SODIUM 40 MG PO TBEC
40.0000 mg | DELAYED_RELEASE_TABLET | Freq: Every day | ORAL | Status: DC
  Administered 2019-05-18 – 2019-05-21 (×4): 40 mg via ORAL
  Filled 2019-05-17 (×4): qty 1

## 2019-05-17 MED ORDER — POLYETHYLENE GLYCOL 3350 17 G PO PACK: 17.0000 g | PACK | Freq: Every day | ORAL | Status: DC | PRN

## 2019-05-17 MED ORDER — ENOXAPARIN SODIUM 40 MG/0.4ML ~~LOC~~ SOLN
40.0000 mg | Freq: Every day | SUBCUTANEOUS | Status: DC
  Administered 2019-05-17 – 2019-05-20 (×4): 40 mg via SUBCUTANEOUS
  Filled 2019-05-17 (×4): qty 0.4

## 2019-05-17 MED ORDER — INSULIN ASPART 100 UNIT/ML ~~LOC~~ SOLN
0.0000 [IU] | Freq: Every day | SUBCUTANEOUS | Status: DC
  Administered 2019-05-20: 3 [IU] via SUBCUTANEOUS

## 2019-05-17 MED ORDER — DEXTROSE 50 % IV SOLN
INTRAVENOUS | Status: AC
  Filled 2019-05-17: qty 50

## 2019-05-17 MED ORDER — SODIUM CHLORIDE 0.9 % IV SOLN
2.0000 g | INTRAVENOUS | Status: DC
  Administered 2019-05-18: 2 g via INTRAVENOUS
  Filled 2019-05-17: qty 2
  Filled 2019-05-17: qty 20

## 2019-05-17 MED ORDER — KETOROLAC TROMETHAMINE 30 MG/ML IJ SOLN
30.0000 mg | Freq: Once | INTRAMUSCULAR | Status: AC
  Administered 2019-05-18: 30 mg via INTRAVENOUS
  Filled 2019-05-17: qty 1

## 2019-05-17 MED ORDER — SODIUM CHLORIDE 0.9 % IV BOLUS
1000.0000 mL | Freq: Once | INTRAVENOUS | Status: AC
  Administered 2019-05-17: 1000 mL via INTRAVENOUS

## 2019-05-17 MED ORDER — ACETAMINOPHEN 325 MG PO TABS
650.0000 mg | ORAL_TABLET | Freq: Once | ORAL | Status: AC
  Administered 2019-05-17: 650 mg via ORAL
  Filled 2019-05-17: qty 2

## 2019-05-17 MED ORDER — SODIUM CHLORIDE 0.9% FLUSH
3.0000 mL | Freq: Two times a day (BID) | INTRAVENOUS | Status: DC
  Administered 2019-05-17 – 2019-05-20 (×6): 3 mL via INTRAVENOUS

## 2019-05-17 MED ORDER — ACETAMINOPHEN 500 MG PO TABS
500.0000 mg | ORAL_TABLET | Freq: Once | ORAL | Status: AC
  Administered 2019-05-17: 500 mg via ORAL
  Filled 2019-05-17: qty 1

## 2019-05-17 NOTE — ED Notes (Signed)
Unsuccessful IV attempt x2 after right AC infiltrated. Kaitlin RN attempting at this time.

## 2019-05-17 NOTE — ED Notes (Signed)
NS 235ml bolus to be given by [facility] per [place] Sizer

## 2019-05-17 NOTE — ED Notes (Signed)
XR at bedside

## 2019-05-17 NOTE — ED Notes (Signed)
ED Provider at bedside. 

## 2019-05-17 NOTE — ED Provider Notes (Signed)
Patient seen on arrival from our affiliated center. She is awake, alert. She is not tachycardic, but is mildly hypotensive.  On she has received fluids, antibiotics, and I reviewed her labs, CT with our advanced practice practitioner. Patient will be admitted for likely pyelonephritis, possible infected stone, this is thought to be less likely by our urology colleagues, who are aware of the patient, are available for consult.   Carmin Muskrat, MD 05/17/19 [phone]

## 2019-05-17 NOTE — ED Notes (Signed)
Pt c/o feels like glucose is low

## 2019-05-17 NOTE — ED Notes (Signed)
Patient requesting IV removal from right arm. No swelling noted. IV flushed without difficulty.

## 2019-05-17 NOTE — ED Notes (Signed)
Multiple blankets removed from pt and pt educated re: fever control. Pt/family to call out when pt thinks she can tolerate po Tylenol.

## 2019-05-17 NOTE — ED Notes (Signed)
This RN attempted IV stick in the right hand x1. Pt pulled and RN barely touched the patient and she became agitated. Pt's husband at bedside. Pt screams and moans anytime this RN looked for another vein

## 2019-05-17 NOTE — ED Notes (Signed)
ED TO INPATIENT HANDOFF REPORT  Name/Age/Gender Patricia Kaiser 63 y.o. female  Code Status    Code Status Orders  (From admission, onward)         Start     Ordered   05/17/19 2016  Full code  Continuous     05/17/19 2015        Code Status History    Date Active Date Inactive Code Status Order ID Comments User Context   10/22/2012 1541 10/24/2012 1527 Full Code [id number]  Bynum Bellows, MD Inpatient   Advance Care Planning Activity      Home/SNF/Other Home  Chief Complaint LOW SUGAR, BODYACHE, CHILLS  Level of Care/Admitting Diagnosis ED Disposition    ED Disposition Condition [place]: [place] [[id number]]  Level of Care: Stepdown [14]  Admit to SDU based on following criteria: Hemodynamic compromise or significant risk of instability:  Patient requiring short term acute titration and management of vasoactive drips, and invasive monitoring (i.e., CVP and Arterial line).  Covid Evaluation: Confirmed COVID Negative  Diagnosis: Sepsis secondary to UTI [facility]) [[id number]]  Admitting Physician: Vianne Bulls [[id number]]  Attending Physician: Vianne Bulls [[id number]]  PT Class (Do Not Modify): Observation [104]  PT Acc Code (Do Not Modify): Observation [10022]       Medical History Past Medical History:  Diagnosis Date  . Chest pain    2D ECHO, 08/25/2010 - EF->55%, normal  . COPD (chronic obstructive pulmonary disease) ([place])   . DOE (dyspnea on exertion)    NUC, 08/25/2010 - low risk scan, normal  . 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 Non-insulin dependent type 2 diabetes mellitus ([place])   . Shortness of breath    MET TEST, 06/26/2012  . Tobacco abuse   . Ureteral stent retained     Allergies Allergies  Allergen Reactions  . Cefdinir Hives, Itching and Rash    blister  . Sulfa Antibiotics Anaphylaxis and Shortness Of Breath  . Codeine Nausea Only  .  Erythromycin Hives  . Gabapentin Nausea And Vomiting    GI upset, pt claims it made her "drunk"  . Metformin And Related Nausea And Vomiting  . Symbicort [Budesonide-Formoterol Fumarate]     Breaks mouth out  . Penicillins Rash    IV Location/Drains/Wounds Patient Lines/Drains/Airways Status   Active Line/Drains/Airways    Name:   Placement date:   Placement time:   Site:   Days:   Peripheral IV 05/17/19 Left Hand   05/17/19    2118    Hand   less than 1          Labs/Imaging Results for orders placed or performed during the hospital encounter of 05/17/19 (from the past 48 hour(s))  CBG monitoring, ED     Status: Abnormal   Collection Time: 05/17/19 12:41 PM  Result Value Ref Range   Glucose-Capillary 134 (H) 70 - 99 mg/dL  Comprehensive metabolic panel     Status: Abnormal   Collection Time: 05/17/19  2:14 PM  Result Value Ref Range   Sodium 139 135 - 145 mmol/L   Potassium 4.1 3.5 - 5.1 mmol/L   Chloride 101 98 - 111 mmol/L   CO2 26 22 - 32 mmol/L   Glucose, Bld 97 70 - 99 mg/dL   BUN 14 8 - 23 mg/dL   Creatinine, Ser 0.70 0.44 - 1.00 mg/dL   Calcium  9.0 8.9 - 10.3 mg/dL   Total Protein 6.9 6.5 - 8.1 g/dL   Albumin 3.9 3.5 - 5.0 g/dL   AST 14 (L) 15 - 41 U/L   ALT 13 0 - 44 U/L   Alkaline Phosphatase 77 38 - 126 U/L   Total Bilirubin 0.5 0.3 - 1.2 mg/dL   GFR calc non Af Amer >60 >60 mL/min   GFR calc Af Amer >60 >60 mL/min   Anion gap 12 5 - 15    Comment: Performed at [facility], [address] [place], [place] [address]  CBC with Differential     Status: Abnormal   Collection Time: 05/17/19  2:14 PM  Result Value Ref Range   WBC 17.5 (H) 4.0 - 10.5 K/uL   RBC 4.75 3.87 - 5.11 MIL/uL   Hemoglobin 15.1 (H) 12.0 - 15.0 g/dL   HCT 44.7 36.0 - 46.0 %   MCV 94.1 80.0 - 100.0 fL   MCH 31.8 26.0 - 34.0 pg   MCHC 33.8 30.0 - 36.0 g/dL   RDW 12.8 11.5 - 15.5 %   Platelets 285 150 - 400 K/uL   nRBC 0.0 0.0 - 0.2 %   Neutrophils Relative % 80 %    Neutro Abs 14.0 (H) 1.7 - 7.7 K/uL   Lymphocytes Relative 14 %   Lymphs Abs 2.4 0.7 - 4.0 K/uL   Monocytes Relative 5 %   Monocytes Absolute 0.9 0.1 - 1.0 K/uL   Eosinophils Relative 0 %   Eosinophils Absolute 0.1 0.0 - 0.5 K/uL   Basophils Relative 0 %   Basophils Absolute 0.1 0.0 - 0.1 K/uL   Immature Granulocytes 1 %   Abs Immature Granulocytes 0.09 (H) 0.00 - 0.07 K/uL    Comment: Performed at [facility], [address] [place], [place] [address]  Urinalysis, Routine w reflex microscopic     Status: Abnormal   Collection Time: 05/17/19  2:14 PM  Result Value Ref Range   Color, Urine YELLOW YELLOW   APPearance CLEAR CLEAR   Specific Gravity, Urine 1.010 1.005 - 1.030   pH 7.0 5.0 - 8.0   Glucose, UA NEGATIVE NEGATIVE mg/dL   Hgb urine dipstick NEGATIVE NEGATIVE   Bilirubin Urine NEGATIVE NEGATIVE   Ketones, ur NEGATIVE NEGATIVE mg/dL   Protein, ur NEGATIVE NEGATIVE mg/dL   Nitrite NEGATIVE NEGATIVE   Leukocytes,Ua TRACE (A) NEGATIVE    Comment: Performed at [facility], [address] [place], [place] [address]  Urinalysis, Microscopic (reflex)     Status: Abnormal   Collection Time: 05/17/19  2:14 PM  Result Value Ref Range   RBC / HPF 0-5 0 - 5 RBC/hpf   WBC, UA 6-10 0 - 5 WBC/hpf   Bacteria, UA MANY (A) NONE SEEN   Squamous Epithelial / LPF 0-5 0 - 5    Comment: Performed at [facility], [address] [place], [place] [address]  SARS Coronavirus 2 (Performed in [place] hospital lab)     Status: None   Collection Time: 05/17/19  3:14 PM   Specimen: Nasopharyngeal Swab  Result Value Ref Range   SARS Coronavirus 2 NEGATIVE NEGATIVE    Comment: (NOTE) If result is NEGATIVE SARS-CoV-2 target nucleic acids are NOT DETECTED. The SARS-CoV-2 RNA is generally detectable in upper and lower  respiratory specimens during the acute phase of infection. The lowest  concentration of SARS-CoV-2 viral copies this assay can  detect is 250   copies / mL. A negative result does not preclude SARS-CoV-2 infection  and should not be used as the sole basis for treatment or other  patient management decisions.  A negative result may occur with  improper specimen collection / handling, submission of specimen other  than nasopharyngeal swab, presence of viral mutation(s) within the  areas targeted by this assay, and inadequate number of viral copies  (<250 copies / mL). A negative result must be combined with clinical  observations, patient history, and epidemiological information. If result is POSITIVE SARS-CoV-2 target nucleic acids are DETECTED. The SARS-CoV-2 RNA is generally detectable in upper and lower  respiratory specimens dur ing the acute phase of infection.  Positive  results are indicative of active infection with SARS-CoV-2.  Clinical  correlation with patient history and other diagnostic information is  necessary to determine patient infection status.  Positive results do  not rule out bacterial infection or co-infection with other viruses. If result is PRESUMPTIVE POSTIVE SARS-CoV-2 nucleic acids MAY BE PRESENT.   A presumptive positive result was obtained on the submitted specimen  and confirmed on repeat testing.  While 2019 novel coronavirus  (SARS-CoV-2) nucleic acids may be present in the submitted sample  additional confirmatory testing may be necessary for epidemiological  and / or clinical management purposes  to differentiate between  SARS-CoV-2 and other Sarbecovirus currently known to infect humans.  If clinically indicated additional testing with an alternate test  methodology [phone]) is advised. The SARS-CoV-2 RNA is generally  detectable in upper and lower respiratory sp ecimens during the acute  phase of infection. The expected result is Negative. Fact Sheet for Patients:  StrictlyIdeas.no Fact Sheet for Healthcare  Providers: BankingDealers.co.za This test is not yet approved or cleared by the [place] FDA and has been authorized for detection and/or diagnosis of SARS-CoV-2 by FDA under an Emergency Use Authorization (EUA).  This EUA will remain in effect (meaning this test can be used) for the duration of the COVID-19 declaration under Section 564(b)(1) of the Act, 21 U.S.C. section 360bbb-3(b)(1), unless the authorization is terminated or revoked sooner. Performed at [facility], [address] [place], [place] [address]   CBG monitoring, ED     Status: Abnormal   Collection Time: 05/17/19  4:50 PM  Result Value Ref Range   Glucose-Capillary 50 (L) 70 - 99 mg/dL  POC CBG, ED     Status: Abnormal   Collection Time: 05/17/19  5:36 PM  Result Value Ref Range   Glucose-Capillary 192 (H) 70 - 99 mg/dL  Lactic acid, plasma     Status: None   Collection Time: 05/17/19  8:22 PM  Result Value Ref Range   Lactic Acid, Venous 1.0 0.5 - 1.9 mmol/L    Comment: Performed at [facility], [place] [address], [place], [place] [address]  Protime-INR     Status: None   Collection Time: 05/17/19  8:24 PM  Result Value Ref Range   Prothrombin Time 12.8 11.4 - 15.2 seconds   INR 1.0 0.8 - 1.2    Comment: (NOTE) INR goal varies based on device and disease states. Performed at [facility], [place] [address], [place], [place] [address]   APTT     Status: None   Collection Time: 05/17/19  8:24 PM  Result Value Ref Range   aPTT 27 24 - 36 seconds    Comment: Performed at [facility], [address] [place], [place] [address]   Dg Chest Port 1 View  Result Date: 05/17/2019 CLINICAL DATA:  Sepsis EXAM: PORTABLE CHEST 1 VIEW COMPARISON:  08/08/2017 FINDINGS: The heart size and mediastinal contours are within normal limits. Both lungs are clear. The visualized skeletal structures are unremarkable. IMPRESSION: No active  disease. Electronically Signed   By: Franchot Gallo M.D.   On: 05/17/2019 19:45   Ct Renal Stone Study  Result Date: 05/17/2019 CLINICAL DATA:  Flank pain and dysuria EXAM: CT ABDOMEN AND PELVIS WITHOUT CONTRAST TECHNIQUE: Multidetector CT imaging of the abdomen and pelvis was performed following the standard protocol without oral or IV contrast. COMPARISON:  August 08, 2017 FINDINGS: Lower chest: Lung bases are clear. Hepatobiliary: There are occasional small calcifications in the liver consistent with granulomas, stable. There is fatty infiltration near the fissure for the ligamentum teres. No other focal liver lesions are evident on this noncontrast enhanced study. The gallbladder wall is not appreciably thickened. There is no evident biliary duct dilatation. Pancreas: There is no pancreatic mass or inflammatory focus. Spleen: No splenic lesions are evident. Adrenals/Urinary Tract: Adrenals appear normal bilaterally. There is scarring along the posterior upper pole left kidney with a 1 mm calcification in this area of scarring, stable. There is a 1 x 1 cm cyst in this area of scarring in the upper pole left kidney posteriorly as well. There is no appreciable hydronephrosis on either side. There is a 1 mm calculus in the mid right kidney. There is no appreciable ureteral calculus on either side. Note that there are vascular calcifications immediately adjacent to the left ureter in the pelvis but separate from the left ureter, not felt to be changed compared to previous study. Urinary bladder wall thickness is within normal limits. Stomach/Bowel: There is no appreciable bowel wall or mesenteric thickening. There are occasional descending colonic and sigmoid diverticula without diverticulitis. There is no bowel obstruction. Terminal ileum appears normal. There is no evident free air or portal venous air. Vascular/Lymphatic: There is aortic and iliac artery atherosclerosis. There is slight aortic ectasia without  frank aneurysmal dilatation, stable. There is no evident adenopathy in the abdomen or pelvis. Reproductive: Uterus absent.  No pelvic mass evident. Other: Appendix appears normal. No abscess or ascites evident in the abdomen or pelvis. Musculoskeletal: There are no blastic or lytic bone lesions. There is no intramuscular or abdominal wall lesion. IMPRESSION: 1. There is a 1 mm calculus in the mid right kidney. No appreciable hydronephrosis or ureteral calculus on either side. Urinary bladder wall thickness normal. 2. Stable scarring along the posterior aspect of the upper pole left kidney. A 1 mm calcification in this area could represent a tiny calculus but is more likely of inflammatory etiology given the scarring. 3. Occasional left-sided colonic diverticula without diverticulitis. No bowel obstruction. No abscess in the abdomen or pelvis. Appendix appears normal. 4.  Aortoiliac atherosclerosis.  Slight aortic ectasia is stable. 5.  Uterus absent. Electronically Signed   By: Lowella Grip III M.D.   On: 05/17/2019 16:01    Pending Labs Unresulted Labs (From admission, onward)    Start     Ordered   05/17/19 1920  Culture, blood (x 2)  BLOOD CULTURE X 2,   STAT    Comments: INITIATE ANTIBIOTICS WITHIN 1 HOUR AFTER BLOOD CULTURES DRAWN.  If unable to obtain blood cultures, call MD immediately regarding antibiotic instructions.    05/17/19 1920   05/17/19 1525  Urine culture  ONCE -  STAT,   STAT     05/17/19 1524   Signed and Held  HIV antibody (Routine Testing)  Tomorrow morning,   R     Signed and Held   Signed and Held  Creatinine, serum  (enoxaparin (LOVENOX)    CrCl >/= 30 ml/min)  Weekly,   R    Comments: while on enoxaparin therapy    Signed and Held   Signed and Held  Basic metabolic panel  Tomorrow morning,   R     Signed and Held   Signed and Held  CBC WITH DIFFERENTIAL  Tomorrow morning,   R     Signed and Held          Vitals/Pain Today's Vitals   05/17/19 2045 05/17/19  2120 05/17/19 2121 05/17/19 2130  BP: (!) 104/51 (!) 118/55  116/65  Pulse: 72 78  83  Resp: _0 Temp:      TempSrc:      SpO2: 95% 95%  94%  Weight:      Height:      PainSc:   6      Isolation Precautions No active isolations  Medications Medications  0.9 %  sodium chloride infusion (250 mLs Intravenous New Bag/Given 05/17/19 1648)  acetaminophen (TYLENOL) tablet 650 mg (has no administration in time range)    Or  acetaminophen (TYLENOL) suppository 650 mg (has no administration in time range)  HYDROcodone-acetaminophen (NORCO/VICODIN) 5-325 MG per tablet 1-2 tablet (has no administration in time range)  ondansetron (ZOFRAN) tablet 4 mg ( Oral See Alternative 05/17/19 2121)    Or  ondansetron (ZOFRAN) injection 4 mg (4 mg Intravenous Given 05/17/19 2121)  cefTRIAXone (ROCEPHIN) 2 g in sodium chloride 0.9 % 100 mL IVPB (has no administration in time range)  morphine 4 MG/ML injection 4 mg (4 mg Intravenous Given 05/17/19 2121)  sodium chloride 0.9 % bolus 1,000 mL ( Intravenous Stopped 05/17/19 1512)  ondansetron (ZOFRAN) injection 4 mg (4 mg Intravenous Given 05/17/19 1412)  acetaminophen (TYLENOL) tablet 650 mg (650 mg Oral Given 05/17/19 1455)  cefTRIAXone (ROCEPHIN) 1 g in sodium chloride 0.9 % 100 mL IVPB ( Intravenous Stopped 05/17/19 1625)  fentaNYL (SUBLIMAZE) injection 50 mcg (50 mcg Intravenous Given 05/17/19 1552)  ondansetron (ZOFRAN) injection 4 mg (4 mg Intravenous Given 05/17/19 1552)  acetaminophen (TYLENOL) tablet 500 mg (500 mg Oral Given 05/17/19 1626)  cefTRIAXone (ROCEPHIN) 1 g in sodium chloride 0.9 % 100 mL IVPB ( Intravenous Stopped 05/17/19 1725)  dextrose 50 % solution 50 mL (50 mLs Intravenous Given 05/17/19 1656)  ondansetron (ZOFRAN) injection 2 mg (2 mg Intravenous Given 05/17/19 1748)  fentaNYL (SUBLIMAZE) injection 50 mcg (50 mcg Intravenous Given 05/17/19 1749)  sodium chloride 0.9 % bolus 250 mL (250 mLs Intravenous Given by EMS 05/17/19 1847)  sodium  chloride 0.9 % bolus 250 mL (0 mLs Intravenous Stopped 05/17/19 2058)  sodium chloride 0.9 % bolus 750 mL (750 mLs Intravenous New Bag/Given (Non-[employer]) 05/17/19 2020)    Mobility walks

## 2019-05-17 NOTE — H&P (Signed)
History and Physical    Patricia Kaiser BLT:[id number] DOB: August 04, 1956 DOA: 05/17/2019  PCP: System, Pcp Not In   Patient coming from: Home   Chief Complaint: Dysuria, abdominal pain, chills, malaise   HPI: Patricia Kaiser is a 63 y.o. 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  Patient has had some dysuria and abdominal pain for a couple days, had some gross hematuria and bilateral flank pain as well, and she then became lightheaded with subjective fevers and chills early this morning.  She continues to have some generalized abdominal discomfort, nausea and loss of appetite, chills, and general malaise.  She denied any recent travel, exposure to anyone with COVID-19, and also denied chest pain, shortness of breath, or cough.  ED Course: Upon arrival to the ED, patient is found to be febrile to 39.3 C, saturating mid 90s on room air, and with blood pressure normal initially but later dropping to 89/46.  EKG features a sinus rhythm.  Chemistry panel is unremarkable.  CBC notable for leukocytosis to 17,500.  CT abdomen and pelvis is notable for 1 mm calculus within the mid right kidney but there is no hydronephrosis or ureteral calculus on either side and the urinary bladder thickness appeared normal.  Urine was sent for culture, patient was given 1.25 L normal saline, Zofran, and 2 g of IV Rocephin.  Blood glucose fell to 50 and she was given an ampule of 50% dextrose.  ED physician discussed the case with urology regarding possibility of infected stone, but this was felt to be less likely.  Hospitalists are consulted for admission.  Review of Systems:  All other systems reviewed and apart  from HPI, are negative.  Past Medical History:  Diagnosis Date   Chest pain    2D ECHO, 08/25/2010 - EF->55%, normal   COPD (chronic obstructive pulmonary disease) (HCC)    DOE (dyspnea on exertion)    NUC, 08/25/2010 - low risk scan, normal   Hypertension    RENAL DOPPLER, 10/01/2010 - 1-59% diameter reduction of the R. renal artery   Non-insulin dependent type 2 diabetes mellitus (HCC)    Shortness of breath    MET TEST, 06/26/2012   Tobacco abuse    Ureteral stent retained     Past Surgical History:  Procedure Laterality Date   CESAREAN SECTION  1980/1984   [place]   with Ureter implant/redo implant in 1990     reports that she has been smoking cigarettes. She has a 7.50 pack-year smoking history. She has never used smokeless tobacco. She reports current alcohol use. She reports that she does not use drugs.  Allergies  Allergen Reactions   Sulfa Antibiotics Anaphylaxis and Shortness Of Breath   Codeine Nausea Only   Erythromycin Hives   Gabapentin Nausea And Vomiting    GI upset, pt claims it made her "drunk"   Metformin And Related Nausea And Vomiting   Symbicort [Budesonide-Formoterol Fumarate]     Breaks mouth out   Penicillins Rash    Family History  Problem Relation Age of Onset   COPD Mother    Lung cancer Mother    Arrhythmia Mother        Atrial Fibrillation   COPD Father  Emphysema Father    Heart failure Father    Diabetes Sister    Hyperlipidemia Sister    Hyperlipidemia Brother    Heart attack Maternal Grandfather    Stroke Paternal Grandfather    Diabetes Sister    Heart murmur Daughter      Prior to Admission medications   Medication Sig Start Date End Date Taking? Authorizing Provider  nitrofurantoin, macrocrystal-monohydrate, (MACROBID) 100 MG capsule Take 100 mg by mouth 2 (two) times daily.  05/02/19 06/01/19 Yes [provider]  umeclidinium  bromide (INCRUSE ELLIPTA) 62.5 MCG/INH AEPB Inhale 1 puff into the lungs daily.  05/05/18  Yes [provider]  acetaminophen (TYLENOL) 500 MG tablet Take 1,000 mg by mouth every 6 (six) hours as needed for pain.    [provider]  albuterol (PROVENTIL) (2.5 MG/3ML) 0.083% nebulizer solution USE 1 VIAL IN NEBULE EVERY 6 HOURS Patient taking differently: Take 2.5 mg by nebulization every 6 (six) hours as needed for wheezing or shortness of breath.  05/17/14   Chipper Herb, MD  atorvastatin (LIPITOR) 20 MG tablet TAKE 1 TABLET (20 MG TOTAL) BY MOUTH DAILY. Patient taking differently: Take 20 mg by mouth daily.     Martin, Mary-Margaret, FNP  BREO ELLIPTA 200-25 MCG/INH AEPB Inhale 1 puff into the lungs daily.  04/20/19   [provider]  cephALEXin (KEFLEX) 500 MG capsule Take 1 capsule (500 mg total) by mouth 4 (four) times daily. 08/04/13   Lajean Saver, MD  co-enzyme Q-10 30 MG capsule Take 30 mg by mouth at bedtime.    [provider]  glipiZIDE (GLUCOTROL) 5 MG tablet TAKE 1 TABLET (5 MG TOTAL) BY MOUTH 2 (TWO) TIMES DAILY BEFORE A MEAL. Patient taking differently: Take 5 mg by mouth 2 (two) times daily before a meal.  06/17/14   Hassell Done, Mary-Margaret, FNP  HYDROcodone-acetaminophen (NORCO/VICODIN) 5-325 MG per tablet Take 1-2 tablets by mouth every 6 (six) hours as needed for pain. 08/04/13   Lajean Saver, MD  ketorolac (TORADOL) 10 MG tablet Take 1 tablet (10 mg total) by mouth every 6 (six) hours as needed for pain. 07/31/13   Haliburton, Mae E, FNP  lisinopril (PRINIVIL,ZESTRIL) 5 MG tablet TAKE 1 TABLET (5 MG TOTAL) BY MOUTH DAILY. Patient taking differently: Take 5 mg by mouth daily.     Hassell Done, Mary-Margaret, FNP  metroNIDAZOLE (FLAGYL) 500 MG tablet Take 1 tablet (500 mg total) by mouth 3 (three) times daily. 11/06/13   Hassell Done, Mary-Margaret, FNP  ondansetron (ZOFRAN) 8 MG tablet Take 1 tablet (8 mg total) by mouth every 8 (eight) hours as needed for  nausea. 08/04/13   Lajean Saver, MD  OVER THE COUNTER MEDICATION Take 1 tablet by mouth daily. Hair, Skin and Nails    [provider]  PREMARIN 1.25 MG tablet TAKE 1 TABLET (1.25 MG TOTAL) BY MOUTH DAILY. Patient taking differently: Take 1.25 mg by mouth daily.  09/20/14   Hassell Done, Mary-Margaret, FNP  SYMBICORT 160-4.5 MCG/ACT inhaler INHALE 2 PUFFS INTO THE LUNGS 2 (TWO) TIMES DAILY. Patient taking differently: Inhale 2 puffs into the lungs 2 (two) times a day.     Chevis Pretty, FNP    Physical Exam: Vitals:   05/17/19 1419 05/17/19 1445 05/17/19 1513 05/17/19 1841  BP:   105/70 (!) 89/46  Pulse:  97 96 68  Resp:  (!) 28 (!) 24 20  Temp: (!) 102.7 F (39.3 C)  (!) 101.9 F (38.8 C) 98.7 F (37.1 C)  TempSrc: Rectal   Oral  SpO2:  95% 94% 94%  Weight:      Height:        Constitutional: NAD, calm  Eyes: PERTLA, lids and conjunctivae normal ENMT: Mucous membranes are moist. Posterior pharynx clear of any exudate or lesions.   Neck: normal, supple, no masses, no thyromegaly Respiratory: clear to auscultation bilaterally, no wheezing, no crackles. No accessory muscle use.  Cardiovascular: S1 & S2 heard, regular rate and rhythm. No extremity edema.   Abdomen: No distension, soft, suprapubic tenderness. Bowel sounds active.  Musculoskeletal: no clubbing / cyanosis. No joint deformity upper and lower extremities.    Skin: no significant rashes, lesions, ulcers. Warm, dry, well-perfused. Neurologic: CN 2-12 grossly intact. Sensation intact. Strength 5/5 in all 4 limbs.  Psychiatric:  Alert and oriented x 3. Calm, cooperative.    Labs on Admission: I have personally reviewed following labs and imaging studies  CBC: Recent Labs  Lab 05/17/19 1414  WBC 17.5*  NEUTROABS 14.0*  HGB 15.1*  HCT 44.7  MCV 94.1  PLT 193   Basic Metabolic Panel: Recent Labs  Lab 05/17/19 1414  NA 139  K 4.1  CL 101  CO2 26  GLUCOSE 97  BUN 14  CREATININE 0.70  CALCIUM 9.0    GFR: Estimated Creatinine Clearance: 56.1 mL/min (by C-G formula based on SCr of 0.7 mg/dL). Liver Function Tests: Recent Labs  Lab 05/17/19 1414  AST 14*  ALT 13  ALKPHOS 77  BILITOT 0.5  PROT 6.9  ALBUMIN 3.9   No results for input(s): LIPASE, AMYLASE in the last 168 hours. No results for input(s): AMMONIA in the last 168 hours. Coagulation Profile: No results for input(s): INR, PROTIME in the last 168 hours. Cardiac Enzymes: No results for input(s): CKTOTAL, CKMB, CKMBINDEX, TROPONINI in the last 168 hours. BNP (last 3 results) No results for input(s): PROBNP in the last 8760 hours. HbA1C: No results for input(s): HGBA1C in the last 72 hours. CBG: Recent Labs  Lab 05/17/19 1241 05/17/19 1650 05/17/19 1736  GLUCAP 134* 50* 192*   Lipid Profile: No results for input(s): CHOL, HDL, LDLCALC, TRIG, CHOLHDL, LDLDIRECT in the last 72 hours. Thyroid Function Tests: No results for input(s): TSH, T4TOTAL, FREET4, T3FREE, THYROIDAB in the last 72 hours. Anemia Panel: No results for input(s): VITAMINB12, FOLATE, FERRITIN, TIBC, IRON, RETICCTPCT in the last 72 hours. Urine analysis:    Component Value Date/Time   COLORURINE YELLOW 05/17/2019 1414   APPEARANCEUR CLEAR 05/17/2019 1414   LABSPEC 1.010 05/17/2019 1414   PHURINE 7.0 05/17/2019 1414   GLUCOSEU NEGATIVE 05/17/2019 1414   HGBUR NEGATIVE 05/17/2019 1414   BILIRUBINUR NEGATIVE 05/17/2019 1414   BILIRUBINUR neg 07/31/2013 1033   KETONESUR NEGATIVE 05/17/2019 1414   PROTEINUR NEGATIVE 05/17/2019 1414   UROBILINOGEN 0.2 08/04/2013 1120   NITRITE NEGATIVE 05/17/2019 1414   LEUKOCYTESUR TRACE (A) 05/17/2019 1414   Sepsis Labs: '@LABRCNTIP'$ (procalcitonin:4,lacticidven:4) ) Recent Results (from the past 240 hour(s))  SARS Coronavirus 2 (Performed in [facility] hospital lab)     Status: None   Collection Time: 05/17/19  3:14 PM   Specimen: Nasopharyngeal Swab  Result Value Ref Range Status   SARS Coronavirus 2  NEGATIVE NEGATIVE Final    Comment: (NOTE) If result is NEGATIVE SARS-CoV-2 target nucleic acids are NOT DETECTED. The SARS-CoV-2 RNA is generally detectable in upper and lower  respiratory specimens during the acute phase of infection. The lowest  concentration of SARS-CoV-2 viral copies this assay can detect  is 250  copies / mL. A negative result does not preclude SARS-CoV-2 infection  and should not be used as the sole basis for treatment or other  patient management decisions.  A negative result may occur with  improper specimen collection / handling, submission of specimen other  than nasopharyngeal swab, presence of viral mutation(s) within the  areas targeted by this assay, and inadequate number of viral copies  (<250 copies / mL). A negative result must be combined with clinical  observations, patient history, and epidemiological information. If result is POSITIVE SARS-CoV-2 target nucleic acids are DETECTED. The SARS-CoV-2 RNA is generally detectable in upper and lower  respiratory specimens dur ing the acute phase of infection.  Positive  results are indicative of active infection with SARS-CoV-2.  Clinical  correlation with patient history and other diagnostic information is  necessary to determine patient infection status.  Positive results do  not rule out bacterial infection or co-infection with other viruses. If result is PRESUMPTIVE POSTIVE SARS-CoV-2 nucleic acids MAY BE PRESENT.   A presumptive positive result was obtained on the submitted specimen  and confirmed on repeat testing.  While 2019 novel coronavirus  (SARS-CoV-2) nucleic acids may be present in the submitted sample  additional confirmatory testing may be necessary for epidemiological  and / or clinical management purposes  to differentiate between  SARS-CoV-2 and other Sarbecovirus currently known to infect humans.  If clinically indicated additional testing with an alternate test  methodology [phone])  is advised. The SARS-CoV-2 RNA is generally  detectable in upper and lower respiratory sp ecimens during the acute  phase of infection. The expected result is Negative. Fact Sheet for Patients:  StrictlyIdeas.no Fact Sheet for Healthcare Providers: BankingDealers.co.za This test is not yet approved or cleared by the [place] FDA and has been authorized for detection and/or diagnosis of SARS-CoV-2 by FDA under an Emergency Use Authorization (EUA).  This EUA will remain in effect (meaning this test can be used) for the duration of the COVID-19 declaration under Section 564(b)(1) of the Act, 21 U.S.C. section 360bbb-3(b)(1), unless the authorization is terminated or revoked sooner. Performed at [facility], [address] [place], [place] [address]      Radiological Exams on Admission: Ct Renal Stone Study  Result Date: 05/17/2019 CLINICAL DATA:  Flank pain and dysuria EXAM: CT ABDOMEN AND PELVIS WITHOUT CONTRAST TECHNIQUE: Multidetector CT imaging of the abdomen and pelvis was performed following the standard protocol without oral or IV contrast. COMPARISON:  August 08, 2017 FINDINGS: Lower chest: Lung bases are clear. Hepatobiliary: There are occasional small calcifications in the liver consistent with granulomas, stable. There is fatty infiltration near the fissure for the ligamentum teres. No other focal liver lesions are evident on this noncontrast enhanced study. The gallbladder wall is not appreciably thickened. There is no evident biliary duct dilatation. Pancreas: There is no pancreatic mass or inflammatory focus. Spleen: No splenic lesions are evident. Adrenals/Urinary Tract: Adrenals appear normal bilaterally. There is scarring along the posterior upper pole left kidney with a 1 mm calcification in this area of scarring, stable. There is a 1 x 1 cm cyst in this area of scarring in the upper pole left kidney posteriorly as  well. There is no appreciable hydronephrosis on either side. There is a 1 mm calculus in the mid right kidney. There is no appreciable ureteral calculus on either side. Note that there are vascular calcifications immediately adjacent to the left ureter in the pelvis but  separate from the left ureter, not felt to be changed compared to previous study. Urinary bladder wall thickness is within normal limits. Stomach/Bowel: There is no appreciable bowel wall or mesenteric thickening. There are occasional descending colonic and sigmoid diverticula without diverticulitis. There is no bowel obstruction. Terminal ileum appears normal. There is no evident free air or portal venous air. Vascular/Lymphatic: There is aortic and iliac artery atherosclerosis. There is slight aortic ectasia without frank aneurysmal dilatation, stable. There is no evident adenopathy in the abdomen or pelvis. Reproductive: Uterus absent.  No pelvic mass evident. Other: Appendix appears normal. No abscess or ascites evident in the abdomen or pelvis. Musculoskeletal: There are no blastic or lytic bone lesions. There is no intramuscular or abdominal wall lesion. IMPRESSION: 1. There is a 1 mm calculus in the mid right kidney. No appreciable hydronephrosis or ureteral calculus on either side. Urinary bladder wall thickness normal. 2. Stable scarring along the posterior aspect of the upper pole left kidney. A 1 mm calcification in this area could represent a tiny calculus but is more likely of inflammatory etiology given the scarring. 3. Occasional left-sided colonic diverticula without diverticulitis. No bowel obstruction. No abscess in the abdomen or pelvis. Appendix appears normal. 4.  Aortoiliac atherosclerosis.  Slight aortic ectasia is stable. 5.  Uterus absent. Electronically Signed   By: Lowella Grip III M.D.   On: 05/17/2019 16:01    EKG: Independently reviewed. Sinus rhythm, rate 83, QTc 410 ms.   Assessment/Plan   1. Sepsis  suspected secondary to UTI  - Presents with 2 days dysuria and one day of chills and malaise, found to be febrile with leukocytosis and SBP dropped to 89 in ED ]\ - She describes history of ureteral reflux and recurrent UTI's s/p ureteral reimplantation and managed with daily nitrofurantoin  - Only urine cultures in EMR had no growth including July 2020 when she was treated with Cipro   - CT stone study with a 46m stone in mid-right kidney but no hydro or ureteral stones  - Urine was sent for culture, 1.25 L NS was given, and she was treated with Rocephin 2 g IV  - Culture blood, check lactic acid, give additional bolus for low BP, and continue Rocephin while following cultures and clinical course    2. Hypoglycemia; type II DM  - A1c 8.4% in July 2020  - CBG 50 in ED, treated with 50% dextrose  - Hold glipizide and Lantus, follow CBG's, and use a low-intensity SSI with Novolog if needed    3. COPD  - No cough or wheezing  - Continue ICS/LABA, LAMA, and as-needed albuterol    4. Hypertension  - BP low in ED and antihypertensives will be held initially    PPE: Mask, face shield  DVT prophylaxis: Lovenox  Code Status: Full  Family Communication: Discussed with patient   Consults called: None Admission status: Observation    TVianne Bulls MD [facility] Pager [phone] If 7PM-7AM, please contact night-coverage [email or web address] Password [email or web address]  05/17/2019, 7:25 PM

## 2019-05-17 NOTE — ED Notes (Signed)
RN Vincente Liberty informed of Pts CBG 50 reading

## 2019-05-17 NOTE — ED Triage Notes (Signed)
Pt reports body aches and chills x today; dysuria started 2d ago

## 2019-05-17 NOTE — ED Provider Notes (Signed)
[facility] EMERGENCY DEPARTMENT Provider Note   CSN: [id number] Arrival date & time: 05/17/19  1232    History   Chief Complaint Chief Complaint  Patient presents with   Generalized Body Aches   Abdominal Pain    HPI Patricia Kaiser is a 63 y.o. female with PMH/o DM, kidney stones, HTN who presents today for fever and chills that began this morning. She reports that she has been feeling some generalized body aches as well as some generalized abdominal pain. She had been trying to let the symptoms pass until this morning at about 3am she felt like her blood sugar dropped and she started experiencing intermittent fever/chills. She did not measure her temperature or her blood sugar. She said since then she has had chills. She has also had some nausea and decreased appetite but no vomiting. She states her abdominal pain is generalized and is constant in nature. She also reports having some bilateral flank pain. She has been having some dysuria over the last two day and today noted some hematuria. She does have a history of kidney stones and has had urethral stents previously. She denies any recent travel, any recent COVID 19 exposure. She denies any CP, SOB, cough, numbness/weakness.      The history is provided by the patient.    Past Medical History:  Diagnosis Date   Chest pain    2D ECHO, 08/25/2010 - EF->55%, normal   COPD (chronic obstructive pulmonary disease) (HCC)    DOE (dyspnea on exertion)    NUC, 08/25/2010 - low risk scan, normal   Hypertension    RENAL DOPPLER, 10/01/2010 - 1-59% diameter reduction of the R. renal artery   Non-insulin dependent type 2 diabetes mellitus (HCC)    Shortness of breath    MET TEST, 06/26/2012   Tobacco abuse    Ureteral stent retained     Patient Active Problem List   Diagnosis Date Noted   Sepsis secondary to UTI ([place]) 05/17/2019   Non-insulin dependent type 2 diabetes mellitus ([place])    Type 2 diabetes mellitus  with hypoglycemia without coma ([place])    Pyelonephritis    COPD exacerbation ([place]) 10/22/2012   Hyperlipemia 10/22/2012   Tobacco use 10/22/2012   Hypokalemia 10/22/2012   Hypertension    COPD (chronic obstructive pulmonary disease) ([place])     Past Surgical History:  Procedure Laterality Date   CESAREAN SECTION  1980/1984   PARTIAL HYSTERECTOMY  1987   TOTAL ABDOMINAL HYSTERECTOMY  1989   with Ureter implant/redo implant in 1990     OB History   No obstetric history on file.      Home Medications    Prior to Admission medications   Medication Sig Start Date End Date Taking? Authorizing Provider  acetaminophen (TYLENOL) 500 MG tablet Take 1,000 mg by mouth every 6 (six) hours as needed for pain.   Yes [provider]  atorvastatin (LIPITOR) 20 MG tablet TAKE 1 TABLET (20 MG TOTAL) BY MOUTH DAILY. Patient taking differently: Take 20 mg by mouth at bedtime.    Yes Martin, Mary-Margaret, FNP  BREO ELLIPTA 200-25 MCG/INH AEPB Inhale 1 puff into the lungs daily.  04/20/19  Yes [provider]  glipiZIDE (GLUCOTROL) 5 MG tablet TAKE 1 TABLET (5 MG TOTAL) BY MOUTH 2 (TWO) TIMES DAILY BEFORE A MEAL. Patient taking differently: Take 5 mg by mouth 2 (two) times daily before a meal.  06/17/14  Yes Hassell Done, Mary-Margaret, FNP  Insulin Glargine (LANTUS SOLOSTAR)  100 UNIT/ML Solostar Pen Inject 15-20 Units into the skin daily.   Yes [provider]  lisinopril (ZESTRIL) 2.5 MG tablet Take 2.5 mg by mouth daily. 05/02/19  Yes [provider]  meloxicam (MOBIC) 15 MG tablet Take 15 mg by mouth at bedtime.  04/14/19  Yes [provider]  nitrofurantoin, macrocrystal-monohydrate, (MACROBID) 100 MG capsule Take 100 mg by mouth 2 (two) times daily.  05/02/19 06/01/19 Yes [provider]  omeprazole (PRILOSEC) 20 MG capsule Take 20 mg by mouth daily. 04/14/19  Yes [provider]  OVER THE COUNTER MEDICATION Take 1 tablet by mouth daily.  Hair, Skin and Nails   Yes [provider]  PREMARIN 1.25 MG tablet TAKE 1 TABLET (1.25 MG TOTAL) BY MOUTH DAILY. Patient taking differently: Take 1.25 mg by mouth at bedtime.  09/20/14  Yes Martin, Mary-Margaret, FNP  umeclidinium bromide (INCRUSE ELLIPTA) 62.5 MCG/INH AEPB Inhale 1 puff into the lungs daily.  05/05/18  Yes [provider]  albuterol (PROVENTIL) (2.5 MG/3ML) 0.083% nebulizer solution USE 1 VIAL IN NEBULE EVERY 6 HOURS Patient taking differently: Take 2.5 mg by nebulization every 6 (six) hours as needed for wheezing or shortness of breath.  05/17/14   Chipper Herb, MD    Family History Family History  Problem Relation Age of Onset   COPD Mother    Lung cancer Mother    Arrhythmia Mother        Atrial Fibrillation   COPD Father    Emphysema Father    Heart failure Father    Diabetes Sister    Hyperlipidemia Sister    Hyperlipidemia Brother    Heart attack Maternal Grandfather    Stroke Paternal Grandfather    Diabetes Sister    Heart murmur Daughter     Social History Social History   Tobacco Use   Smoking status: Current Every Day Smoker    Packs/day: 0.25    Years: 30.00    Pack years: 7.50    Types: Cigarettes   Smokeless tobacco: Never Used  Substance Use Topics   Alcohol use: Yes    Comment: Occasionally, not very often   Drug use: No     Allergies   Cefdinir, Sulfa antibiotics, Codeine, Erythromycin, Gabapentin, Metformin and related, Symbicort [budesonide-formoterol fumarate], and Penicillins   Review of Systems Review of Systems  Constitutional: Positive for appetite change, chills and fever.  Respiratory: Negative for cough and shortness of breath.   Cardiovascular: Negative for chest pain.  Gastrointestinal: Positive for abdominal pain and nausea. Negative for vomiting.  Genitourinary: Positive for dysuria, flank pain and hematuria.  Neurological: Negative for headaches.     Physical Exam Updated  Vital Signs BP (!) 118/55    Pulse 78    Temp 98.7 F (37.1 C) (Oral)    Resp 16    Ht '5\' 2"'$  (1.575 m)    Wt 48.1 kg    SpO2 95%    BMI 19.39 kg/m   Physical Exam Vitals signs and nursing note reviewed.  Constitutional:      Appearance: Normal appearance. She is well-developed.     Comments: Appears uncomfortable. Rigorous.   HENT:     Head: Normocephalic and atraumatic.  Eyes:     General: Lids are normal.     Conjunctiva/sclera: Conjunctivae normal.     Pupils: Pupils are equal, round, and reactive to light.  Neck:     Musculoskeletal: Full passive range of motion without pain.  Cardiovascular:  Rate and Rhythm: Normal rate and regular rhythm.     Pulses: Normal pulses.     Heart sounds: Normal heart sounds. No murmur. No friction rub. No gallop.   Pulmonary:     Effort: Pulmonary effort is normal.     Breath sounds: Normal breath sounds.     Comments: Lungs clear to auscultation bilaterally.  Symmetric chest rise.  No wheezing, rales, rhonchi. Abdominal:     Palpations: Abdomen is soft. Abdomen is not rigid.     Tenderness: There is generalized abdominal tenderness. There is right CVA tenderness and left CVA tenderness. There is no guarding.     Comments: Abdomen is soft, non-distended. Diffuse tenderness noted with no focal point. No rigidity, No guarding. No peritoneal signs. Bilateral CVA tenderness (R>L).   Musculoskeletal: Normal range of motion.  Skin:    General: Skin is warm and dry.     Capillary Refill: Capillary refill takes less than 2 seconds.  Neurological:     Mental Status: She is alert and oriented to person, place, and time.  Psychiatric:        Speech: Speech normal.      ED Treatments / Results  Labs (all labs ordered are listed, but only abnormal results are displayed) Labs Reviewed  COMPREHENSIVE METABOLIC PANEL - Abnormal; Notable for the following components:      Result Value   AST 14 (*)    All other components within normal limits   CBC WITH DIFFERENTIAL/PLATELET - Abnormal; Notable for the following components:   WBC 17.5 (*)    Hemoglobin 15.1 (*)    Neutro Abs 14.0 (*)    Abs Immature Granulocytes 0.09 (*)    All other components within normal limits  URINALYSIS, ROUTINE W REFLEX MICROSCOPIC - Abnormal; Notable for the following components:   Leukocytes,Ua TRACE (*)    All other components within normal limits  URINALYSIS, MICROSCOPIC (REFLEX) - Abnormal; Notable for the following components:   Bacteria, UA MANY (*)    All other components within normal limits  CBG MONITORING, ED - Abnormal; Notable for the following components:   Glucose-Capillary 134 (*)    All other components within normal limits  CBG MONITORING, ED - Abnormal; Notable for the following components:   Glucose-Capillary 50 (*)    All other components within normal limits  CBG MONITORING, ED - Abnormal; Notable for the following components:   Glucose-Capillary 192 (*)    All other components within normal limits  SARS CORONAVIRUS 2 (HOSPITAL ORDER, [facility])  URINE CULTURE  CULTURE, BLOOD (ROUTINE X 2)  CULTURE, BLOOD (ROUTINE X 2)  LACTIC ACID, PLASMA  PROTIME-INR  APTT    EKG EKG Interpretation  Date/Time:  Thursday May 17 2019 12:52:38 EDT Ventricular Rate:  76 PR Interval:    QRS Duration: 83 QT Interval:  364 QTC Calculation: 410 R Axis:   64 Text Interpretation:  Sinus rhythm Borderline short PR interval RSR' in V1 or V2, probably normal variant Poor data quality in current ECG precludes serial comparison Confirmed by Sherwood Gambler [phone]) on 05/17/2019 1:30:47 PM   Radiology Dg Chest Port 1 View  Result Date: 05/17/2019 CLINICAL DATA:  Sepsis EXAM: PORTABLE CHEST 1 VIEW COMPARISON:  08/08/2017 FINDINGS: The heart size and mediastinal contours are within normal limits. Both lungs are clear. The visualized skeletal structures are unremarkable. IMPRESSION: No active disease. Electronically  Signed   By: Franchot Gallo M.D.   On: 05/17/2019 19:45  Ct Renal Stone Study  Result Date: 05/17/2019 CLINICAL DATA:  Flank pain and dysuria EXAM: CT ABDOMEN AND PELVIS WITHOUT CONTRAST TECHNIQUE: Multidetector CT imaging of the abdomen and pelvis was performed following the standard protocol without oral or IV contrast. COMPARISON:  August 08, 2017 FINDINGS: Lower chest: Lung bases are clear. Hepatobiliary: There are occasional small calcifications in the liver consistent with granulomas, stable. There is fatty infiltration near the fissure for the ligamentum teres. No other focal liver lesions are evident on this noncontrast enhanced study. The gallbladder wall is not appreciably thickened. There is no evident biliary duct dilatation. Pancreas: There is no pancreatic mass or inflammatory focus. Spleen: No splenic lesions are evident. Adrenals/Urinary Tract: Adrenals appear normal bilaterally. There is scarring along the posterior upper pole left kidney with a 1 mm calcification in this area of scarring, stable. There is a 1 x 1 cm cyst in this area of scarring in the upper pole left kidney posteriorly as well. There is no appreciable hydronephrosis on either side. There is a 1 mm calculus in the mid right kidney. There is no appreciable ureteral calculus on either side. Note that there are vascular calcifications immediately adjacent to the left ureter in the pelvis but separate from the left ureter, not felt to be changed compared to previous study. Urinary bladder wall thickness is within normal limits. Stomach/Bowel: There is no appreciable bowel wall or mesenteric thickening. There are occasional descending colonic and sigmoid diverticula without diverticulitis. There is no bowel obstruction. Terminal ileum appears normal. There is no evident free air or portal venous air. Vascular/Lymphatic: There is aortic and iliac artery atherosclerosis. There is slight aortic ectasia without frank aneurysmal  dilatation, stable. There is no evident adenopathy in the abdomen or pelvis. Reproductive: Uterus absent.  No pelvic mass evident. Other: Appendix appears normal. No abscess or ascites evident in the abdomen or pelvis. Musculoskeletal: There are no blastic or lytic bone lesions. There is no intramuscular or abdominal wall lesion. IMPRESSION: 1. There is a 1 mm calculus in the mid right kidney. No appreciable hydronephrosis or ureteral calculus on either side. Urinary bladder wall thickness normal. 2. Stable scarring along the posterior aspect of the upper pole left kidney. A 1 mm calcification in this area could represent a tiny calculus but is more likely of inflammatory etiology given the scarring. 3. Occasional left-sided colonic diverticula without diverticulitis. No bowel obstruction. No abscess in the abdomen or pelvis. Appendix appears normal. 4.  Aortoiliac atherosclerosis.  Slight aortic ectasia is stable. 5.  Uterus absent. Electronically Signed   By: Lowella Grip III M.D.   On: 05/17/2019 16:01    Procedures Procedures (including critical care time)  Medications Ordered in ED Medications  0.9 %  sodium chloride infusion (250 mLs Intravenous New Bag/Given 05/17/19 1648)  acetaminophen (TYLENOL) tablet 650 mg (has no administration in time range)    Or  acetaminophen (TYLENOL) suppository 650 mg (has no administration in time range)  HYDROcodone-acetaminophen (NORCO/VICODIN) 5-325 MG per tablet 1-2 tablet (has no administration in time range)  ondansetron (ZOFRAN) tablet 4 mg ( Oral See Alternative 05/17/19 2121)    Or  ondansetron (ZOFRAN) injection 4 mg (4 mg Intravenous Given 05/17/19 2121)  cefTRIAXone (ROCEPHIN) 2 g in sodium chloride 0.9 % 100 mL IVPB (has no administration in time range)  morphine 4 MG/ML injection 4 mg (4 mg Intravenous Given 05/17/19 2121)  sodium chloride 0.9 % bolus 1,000 mL ( Intravenous Stopped 05/17/19 1512)  ondansetron (ZOFRAN)  injection 4 mg (4 mg  Intravenous Given 05/17/19 1412)  acetaminophen (TYLENOL) tablet 650 mg (650 mg Oral Given 05/17/19 1455)  cefTRIAXone (ROCEPHIN) 1 g in sodium chloride 0.9 % 100 mL IVPB ( Intravenous Stopped 05/17/19 1625)  fentaNYL (SUBLIMAZE) injection 50 mcg (50 mcg Intravenous Given 05/17/19 1552)  ondansetron (ZOFRAN) injection 4 mg (4 mg Intravenous Given 05/17/19 1552)  acetaminophen (TYLENOL) tablet 500 mg (500 mg Oral Given 05/17/19 1626)  cefTRIAXone (ROCEPHIN) 1 g in sodium chloride 0.9 % 100 mL IVPB ( Intravenous Stopped 05/17/19 1725)  dextrose 50 % solution 50 mL (50 mLs Intravenous Given 05/17/19 1656)  ondansetron (ZOFRAN) injection 2 mg (2 mg Intravenous Given 05/17/19 1748)  fentaNYL (SUBLIMAZE) injection 50 mcg (50 mcg Intravenous Given 05/17/19 1749)  sodium chloride 0.9 % bolus 250 mL (250 mLs Intravenous Given by EMS 05/17/19 1847)  sodium chloride 0.9 % bolus 250 mL (0 mLs Intravenous Stopped 05/17/19 2058)  sodium chloride 0.9 % bolus 750 mL (750 mLs Intravenous New Bag/Given (Non-Interop) 05/17/19 2020)     Initial Impression / Assessment and Plan / ED Course  I have reviewed the triage vital signs and the nursing notes.  Pertinent labs & imaging results that were available during my care of the patient were reviewed by me and considered in my medical decision making (see chart for details).        63 y.o. F with PMH/o DM, HTN, kidney stones presents for evaluation of generalized body aches, nausea, fever/chills, dysuria and hematuria.  History of kidney stones that required stenting.  No recent travel, COVID-19 exposure.  On initial ED arrival, she is febrile, tachypneic but vitals otherwise stable.  Patient with generalized abdominal pain and some right-sided CVA tenderness.  Concern for UTI versus kidney stone versus infected kidney stone.  Also consider abdominal infectious etiology.  Given fever and generalized body aches, will plan for cover testing.  CMP is unremarkable.  CBC shows  leukocytosis of 17.5.  UA does show trace leukocytes with small amount of pyuria.  Urine culture sent.  COVID negative.  CT renal study shows 1 mm kidney stone on the right, concerning for infected kidney stone vs pyelo.  Patient started on Rocephin.  Will consult urology.  Discussed patient with Dr. Junious Silk (Urology).  Recommends medical admission with plans for urology consult.  At this time, given size and location of the kidney stone, does not feel it needs emergent stenting.  At this time, will plan to transfer to [facility] emergency department for urology consult and medical admission.  Discussed with Dr. Tyrone Nine who agrees for transfer and accepts patient for transfer to ED.  Portions of this note were generated with [employer]. Dictation errors may occur despite best attempts at proofreading.   Final Clinical Impressions(s) / ED Diagnoses   Final diagnoses:  Pyelonephritis    ED Discharge Orders    None       Patricia Kaiser 05/17/19 2126    Drenda Freeze, MD 05/21/19 1055

## 2019-05-17 NOTE — ED Notes (Signed)
Report to Jinny Blossom, Agricultural consultant at [facility] ED.

## 2019-05-18 DIAGNOSIS — J441 Chronic obstructive pulmonary disease with (acute) exacerbation: Secondary | ICD-10-CM | POA: Diagnosis present

## 2019-05-18 DIAGNOSIS — N390 Urinary tract infection, site not specified: Secondary | ICD-10-CM | POA: Diagnosis present

## 2019-05-18 DIAGNOSIS — J9601 Acute respiratory failure with hypoxia: Secondary | ICD-10-CM | POA: Diagnosis present

## 2019-05-18 DIAGNOSIS — F17210 Nicotine dependence, cigarettes, uncomplicated: Secondary | ICD-10-CM | POA: Diagnosis present

## 2019-05-18 DIAGNOSIS — Z8249 Family history of ischemic heart disease and other diseases of the circulatory system: Secondary | ICD-10-CM | POA: Diagnosis not present

## 2019-05-18 DIAGNOSIS — Z880 Allergy status to penicillin: Secondary | ICD-10-CM | POA: Diagnosis not present

## 2019-05-18 DIAGNOSIS — Z90711 Acquired absence of uterus with remaining cervical stump: Secondary | ICD-10-CM | POA: Diagnosis not present

## 2019-05-18 DIAGNOSIS — Z7984 Long term (current) use of oral hypoglycemic drugs: Secondary | ICD-10-CM | POA: Diagnosis not present

## 2019-05-18 DIAGNOSIS — Z79899 Other long term (current) drug therapy: Secondary | ICD-10-CM | POA: Diagnosis not present

## 2019-05-18 DIAGNOSIS — Z794 Long term (current) use of insulin: Secondary | ICD-10-CM | POA: Diagnosis not present

## 2019-05-18 DIAGNOSIS — Z823 Family history of stroke: Secondary | ICD-10-CM | POA: Diagnosis not present

## 2019-05-18 DIAGNOSIS — T380X5A Adverse effect of glucocorticoids and synthetic analogues, initial encounter: Secondary | ICD-10-CM | POA: Diagnosis present

## 2019-05-18 DIAGNOSIS — Z833 Family history of diabetes mellitus: Secondary | ICD-10-CM | POA: Diagnosis not present

## 2019-05-18 DIAGNOSIS — E11649 Type 2 diabetes mellitus with hypoglycemia without coma: Secondary | ICD-10-CM | POA: Diagnosis present

## 2019-05-18 DIAGNOSIS — J449 Chronic obstructive pulmonary disease, unspecified: Secondary | ICD-10-CM | POA: Diagnosis not present

## 2019-05-18 DIAGNOSIS — R509 Fever, unspecified: Secondary | ICD-10-CM | POA: Diagnosis present

## 2019-05-18 DIAGNOSIS — B961 Klebsiella pneumoniae [K. pneumoniae] as the cause of diseases classified elsewhere: Secondary | ICD-10-CM | POA: Diagnosis present

## 2019-05-18 DIAGNOSIS — Z801 Family history of malignant neoplasm of trachea, bronchus and lung: Secondary | ICD-10-CM | POA: Diagnosis not present

## 2019-05-18 DIAGNOSIS — A419 Sepsis, unspecified organism: Secondary | ICD-10-CM | POA: Diagnosis present

## 2019-05-18 DIAGNOSIS — Z7951 Long term (current) use of inhaled steroids: Secondary | ICD-10-CM | POA: Diagnosis not present

## 2019-05-18 DIAGNOSIS — I10 Essential (primary) hypertension: Secondary | ICD-10-CM | POA: Diagnosis present

## 2019-05-18 DIAGNOSIS — N3001 Acute cystitis with hematuria: Secondary | ICD-10-CM | POA: Diagnosis not present

## 2019-05-18 DIAGNOSIS — Z20828 Contact with and (suspected) exposure to other viral communicable diseases: Secondary | ICD-10-CM | POA: Diagnosis present

## 2019-05-18 LAB — GLUCOSE, CAPILLARY
Glucose-Capillary: 117 mg/dL — ABNORMAL HIGH (ref 70–99)
Glucose-Capillary: 153 mg/dL — ABNORMAL HIGH (ref 70–99)
Glucose-Capillary: 158 mg/dL — ABNORMAL HIGH (ref 70–99)
Glucose-Capillary: 150 mg/dL — ABNORMAL HIGH (ref 70–99)

## 2019-05-18 LAB — CBC WITH DIFFERENTIAL/PLATELET
Abs Immature Granulocytes: 0.08 10*3/uL — ABNORMAL HIGH (ref 0.00–0.07)
Basophils Absolute: 0 10*3/uL (ref 0.0–0.1)
Basophils Relative: 0 %
Eosinophils Absolute: 0 10*3/uL (ref 0.0–0.5)
Eosinophils Relative: 0 %
HCT: 34.2 % — ABNORMAL LOW (ref 36.0–46.0)
Hemoglobin: 11 g/dL — ABNORMAL LOW (ref 12.0–15.0)
Immature Granulocytes: 1 %
Lymphocytes Relative: 12 %
Lymphs Abs: 1.9 10*3/uL (ref 0.7–4.0)
MCH: 31.2 pg (ref 26.0–34.0)
MCHC: 32.2 g/dL (ref 30.0–36.0)
MCV: 96.9 fL (ref 80.0–100.0)
Monocytes Absolute: 1.3 10*3/uL — ABNORMAL HIGH (ref 0.1–1.0)
Monocytes Relative: 8 %
Neutro Abs: 13 10*3/uL — ABNORMAL HIGH (ref 1.7–7.7)
Neutrophils Relative %: 79 %
Platelets: 203 10*3/uL (ref 150–400)
RBC: 3.53 MIL/uL — ABNORMAL LOW (ref 3.87–5.11)
RDW: 13.3 % (ref 11.5–15.5)
WBC: 16.4 10*3/uL — ABNORMAL HIGH (ref 4.0–10.5)
nRBC: 0 % (ref 0.0–0.2)

## 2019-05-18 LAB — BASIC METABOLIC PANEL
Anion gap: 7 (ref 5–15)
BUN: 13 mg/dL (ref 8–23)
CO2: 23 mmol/L (ref 22–32)
Calcium: 7.7 mg/dL — ABNORMAL LOW (ref 8.9–10.3)
Chloride: 108 mmol/L (ref 98–111)
Creatinine, Ser: 0.79 mg/dL (ref 0.44–1.00)
GFR calc Af Amer: >60 mL/min (ref >60)
GFR calc non Af Amer: >60 mL/min (ref >60)
Glucose, Bld: 263 mg/dL — ABNORMAL HIGH (ref 70–99)
Potassium: 3.7 mmol/L (ref 3.5–5.1)
Sodium: 138 mmol/L (ref 135–145)

## 2019-05-18 LAB — MRSA PCR SCREENING: MRSA by PCR: NEGATIVE

## 2019-05-18 LAB — HIV ANTIBODY (ROUTINE TESTING W REFLEX): HIV Screen 4th Generation wRfx: NONREACTIVE

## 2019-05-18 MED ORDER — CYCLOBENZAPRINE HCL 5 MG PO TABS
5.0000 mg | ORAL_TABLET | Freq: Three times a day (TID) | ORAL | Status: DC | PRN
  Administered 2019-05-19: 5 mg via ORAL
  Filled 2019-05-18: qty 1

## 2019-05-18 MED ORDER — MORPHINE SULFATE (PF) 2 MG/ML IV SOLN
1.0000 mg | INTRAVENOUS | Status: DC | PRN
  Administered 2019-05-18 – 2019-05-20 (×6): 2 mg via INTRAVENOUS
  Filled 2019-05-18 (×6): qty 1

## 2019-05-18 MED ORDER — FLUCONAZOLE 100 MG PO TABS: 100.0000 mg | ORAL_TABLET | Freq: Every day | ORAL | Status: DC

## 2019-05-18 MED ORDER — METHOCARBAMOL 1000 MG/10ML IJ SOLN
500.0000 mg | Freq: Once | INTRAVENOUS | Status: AC
  Administered 2019-05-18: 500 mg via INTRAVENOUS
  Filled 2019-05-18: qty 5

## 2019-05-18 MED ORDER — LORAZEPAM 2 MG/ML IJ SOLN
0.5000 mg | Freq: Four times a day (QID) | INTRAMUSCULAR | Status: DC | PRN
  Administered 2019-05-18 – 2019-05-20 (×2): 0.5 mg via INTRAVENOUS
  Filled 2019-05-18 (×2): qty 1

## 2019-05-18 MED ORDER — SACCHAROMYCES BOULARDII 250 MG PO CAPS
250.0000 mg | ORAL_CAPSULE | Freq: Two times a day (BID) | ORAL | Status: DC
  Administered 2019-05-18 – 2019-05-21 (×7): 250 mg via ORAL
  Filled 2019-05-18 (×7): qty 1

## 2019-05-18 MED ORDER — MELOXICAM 15 MG PO TABS
15.0000 mg | ORAL_TABLET | Freq: Every day | ORAL | Status: DC
  Filled 2019-05-18: qty 1

## 2019-05-18 MED ORDER — LACTATED RINGERS IV SOLN
INTRAVENOUS | Status: DC
  Administered 2019-05-18: 09:00:00 via INTRAVENOUS

## 2019-05-18 NOTE — Progress Notes (Signed)
[facility] Progress Note  Patient: Patricia Kaiser ZOX:[id number]   PCP: System, Pcp Not In DOB: 09-03-1956   DOA: 05/17/2019   DOS: 05/18/2019   Date of Service: the patient was seen and examined on 05/18/2019  Brief hospital course: Pt. 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 Currently further plan is continue Antibiotics .  Subjective: Reports intermittent colicky-like abdominal pain.  No nausea no vomiting.  No fever no chills.  Later in the afternoon reports generalized abdominal pain.  No diarrhea.  No constipation.  Passing gas.  Assessment and Plan: 1. Sepsis suspected secondary to UTI  - Presents with 2 days dysuria and one day of chills and malaise, found to be febrile with leukocytosis and SBP dropped to 89 in ED - She describes history of ureteral reflux and recurrent UTI's s/p ureteral reimplantation and managed with daily nitrofurantoin  - Only urine cultures in EMR had no growth including July 2020 when she was treated with Cipro   - CT stone study with a 1mm stone in mid-right kidney but no hydro or ureteral stones  - Urine was sent for culture, IV fluids was given, and she was treated with Rocephin 2 g IV  - She reports severe abdominal pain.  With unremarkable CT renal study as well as otherwise unremarkable vitals I do not have any concern for any significant intra-abdominal pathology for now that requires further imaging. We will try to treat the patient's pain with Robaxin and Ativan. Use PRN morphine. Follow-up on cultures and clinical course.  2. Hypoglycemia; type II DM  - A1c 8.4% in July 2020  - CBG 50 in ED, treated with 50% dextrose  - Hold glipizide and Lantus,  follow CBG's, and use a low-intensity SSI with Novolog if needed    3. COPD  - No cough or wheezing  - Continue ICS/LABA, LAMA, and as-needed albuterol    4. Hypertension  Hypotensive - BP low in ED and antihypertensives will be held initially   Diet: Cardiac diet DVT Prophylaxis: Subcutaneous Heparin   Advance goals of care discussion: none  Family Communication: no family was present at bedside, at the time of interview.  Disposition:  Discharge to Home .  Consultants: none Procedures: none  Scheduled Meds: . atorvastatin  20 mg Oral q1800  . Chlorhexidine Gluconate Cloth  6 each Topical Daily  . enoxaparin (LOVENOX) injection  40 mg Subcutaneous QHS  . fluticasone furoate-vilanterol  1 puff Inhalation Daily  . insulin aspart  0-5 Units Subcutaneous QHS  . insulin aspart  0-9 Units Subcutaneous TID WC  . pantoprazole  40 mg Oral Daily  . saccharomyces boulardii  250 mg Oral BID  . sodium chloride flush  3 mL Intravenous Q12H  . sodium chloride flush  3 mL Intravenous Q12H  . umeclidinium bromide  1 puff Inhalation Daily   Continuous Infusions: . sodium chloride Stopped (05/17/19 1749)  . sodium chloride    . cefTRIAXone (ROCEPHIN)  IV Stopped (05/18/19 1657)  . lactated ringers Stopped (05/18/19 1754)   PRN Meds: sodium chloride, sodium chloride, acetaminophen **OR** acetaminophen, albuterol, cyclobenzaprine, HYDROcodone-acetaminophen, LORazepam, morphine injection, ondansetron **OR** ondansetron (ZOFRAN) IV, polyethylene glycol, sodium chloride flush Antibiotics: Anti-infectives (From admission, onward)   Start     Dose/Rate Route  Frequency Ordered Stop   05/18/19 1600  cefTRIAXone (ROCEPHIN) 2 g in sodium chloride 0.9 % 100 mL IVPB     2 g 200 mL/hr over 30 Minutes Intravenous Every 24 hours 05/17/19 1939     05/18/19 1000  fluconazole (DIFLUCAN) tablet 100 mg  Status:  Discontinued     100 mg Oral Daily 05/18/19 0928 05/18/19 0930   05/17/19 1645  cefTRIAXone  (ROCEPHIN) 1 g in sodium chloride 0.9 % 100 mL IVPB     1 g 200 mL/hr over 30 Minutes Intravenous  Once 05/17/19 1630 05/17/19 1725   05/17/19 1530  cefTRIAXone (ROCEPHIN) 1 g in sodium chloride 0.9 % 100 mL IVPB     1 g 200 mL/hr over 30 Minutes Intravenous  Once 05/17/19 1524 05/17/19 1625       Objective: Physical Exam: Vitals:   05/18/19 1600 05/18/19 1625 05/18/19 1700 05/18/19 1742  BP: (!) 88/47 (!) 99/55 (!) 118/53   Pulse: 90 86 80   Resp: 18 20 (!) 21   Temp: 99.8 F (37.7 C)   98.1 F (36.7 C)  TempSrc: Oral   Oral  SpO2: 94% 93% (!) 89%   Weight:      Height:        Intake/Output Summary (Last 24 hours) at 05/18/2019 1907 Last data filed at 05/18/2019 1814 Gross per 24 hour  Intake 1385.88 ml  Output 150 ml  Net 1235.88 ml   Filed Weights   05/17/19 1257  Weight: 48.1 kg   General: alert and oriented to time, place, and person. Appear in moderate distress, affect appropriate Eyes: PERRL, Conjunctiva normal ENT: Oral Mucosa Clear, moist  Neck: no JVD, no Abnormal Mass Or lumps Cardiovascular: S1 and S2 Present, no Murmur, peripheral pulses symmetrical Respiratory: normal respiratory effort, Bilateral Air entry equal and Decreased, no use of accessory muscle, Clear to Auscultation, no Crackles, no wheezes Abdomen: Bowel Sound present, Soft and mild tenderness, no hernia Skin: no rashes  Extremities: no Pedal edema, no calf tenderness Neurologic: normal without focal findings, mental status, speech normal, alert and oriented x3, PERLA, Motor strength 5/5 and symmetric and sensation grossly normal to light touch Gait not checked due to patient safety concerns  Data Reviewed: CBC: Recent Labs  Lab 05/17/19 1414 05/18/19 0239  WBC 17.5* 16.4*  NEUTROABS 14.0* 13.0*  HGB 15.1* 11.0*  HCT 44.7 34.2*  MCV 94.1 96.9  PLT 285 774   Basic Metabolic Panel: Recent Labs  Lab 05/17/19 1414 05/18/19 0239  NA 139 138  K 4.1 3.7  CL 101 108  CO2 26 23   GLUCOSE 97 263*  BUN 14 13  CREATININE 0.70 0.79  CALCIUM 9.0 7.7*    Liver Function Tests: Recent Labs  Lab 05/17/19 1414  AST 14*  ALT 13  ALKPHOS 77  BILITOT 0.5  PROT 6.9  ALBUMIN 3.9   No results for input(s): LIPASE, AMYLASE in the last 168 hours. No results for input(s): AMMONIA in the last 168 hours. Coagulation Profile: Recent Labs  Lab 05/17/19 2024  INR 1.0   Cardiac Enzymes: No results for input(s): CKTOTAL, CKMB, CKMBINDEX, TROPONINI in the last 168 hours. BNP (last 3 results) No results for input(s): PROBNP in the last 8760 hours. CBG: Recent Labs  Lab 05/17/19 1736 05/17/19 2223 05/18/19 0735 05/18/19 1131 05/18/19 1628  GLUCAP 192* 74 117* 153* 158*   Studies: Dg Chest Port 1 View  Result Date: 05/17/2019 CLINICAL DATA:  Sepsis EXAM: PORTABLE CHEST  1 VIEW COMPARISON:  08/08/2017 FINDINGS: The heart size and mediastinal contours are within normal limits. Both lungs are clear. The visualized skeletal structures are unremarkable. IMPRESSION: No active disease. Electronically Signed   By: Marlan Palauharles  Clark M.D.   On: 05/17/2019 19:45    Time spent: 35 minutes  Author: Lynden OxfordPranav Inari Shin, MD [facility] 05/18/2019 7:07 PM  To reach On-call, see care teams to locate the attending and reach out to them via [email or web address]. If 7PM-7AM, please contact night-coverage If you still have difficulty reaching the attending provider, please page the [facility] (Director on Call) for [facility] on amion for assistance.

## 2019-05-18 NOTE — Progress Notes (Addendum)
Notified Dr. Posey Pronto of pts reaction and current status. Pt was screaming out in pain yelling, "I can't take this anymore." When asked where the pain was it started in her lover back but now she's screaming, "It's everywhere, all over!" Was able to give pt ordered Ativan and hang the Robaxin.  After 52min patient started to calm down and started snoring.  Will continue to monitor pt and status of pain

## 2019-05-19 DIAGNOSIS — N3001 Acute cystitis with hematuria: Secondary | ICD-10-CM

## 2019-05-19 LAB — GLUCOSE, CAPILLARY
Glucose-Capillary: 92 mg/dL (ref 70–99)
Glucose-Capillary: 183 mg/dL — ABNORMAL HIGH (ref 70–99)
Glucose-Capillary: 154 mg/dL — ABNORMAL HIGH (ref 70–99)
Glucose-Capillary: 140 mg/dL — ABNORMAL HIGH (ref 70–99)

## 2019-05-19 LAB — CBC WITH DIFFERENTIAL/PLATELET
Abs Immature Granulocytes: 0.09 10*3/uL — ABNORMAL HIGH (ref 0.00–0.07)
Basophils Absolute: 0.1 10*3/uL (ref 0.0–0.1)
Basophils Relative: 0 %
Eosinophils Absolute: 0 10*3/uL (ref 0.0–0.5)
Eosinophils Relative: 0 %
HCT: 35.2 % — ABNORMAL LOW (ref 36.0–46.0)
Hemoglobin: 11.1 g/dL — ABNORMAL LOW (ref 12.0–15.0)
Immature Granulocytes: 1 %
Lymphocytes Relative: 13 %
Lymphs Abs: 2.2 10*3/uL (ref 0.7–4.0)
MCH: 31.1 pg (ref 26.0–34.0)
MCHC: 31.5 g/dL (ref 30.0–36.0)
MCV: 98.6 fL (ref 80.0–100.0)
Monocytes Absolute: 0.6 10*3/uL (ref 0.1–1.0)
Monocytes Relative: 4 %
Neutro Abs: 13.8 10*3/uL — ABNORMAL HIGH (ref 1.7–7.7)
Neutrophils Relative %: 82 %
Platelets: 176 10*3/uL (ref 150–400)
RBC: 3.57 MIL/uL — ABNORMAL LOW (ref 3.87–5.11)
RDW: 13.3 % (ref 11.5–15.5)
WBC: 16.7 10*3/uL — ABNORMAL HIGH (ref 4.0–10.5)
nRBC: 0 % (ref 0.0–0.2)

## 2019-05-19 LAB — URINE CULTURE: Culture: 50000 — AB

## 2019-05-19 LAB — BASIC METABOLIC PANEL
Anion gap: 8 (ref 5–15)
BUN: 8 mg/dL (ref 8–23)
CO2: 24 mmol/L (ref 22–32)
Calcium: 8.1 mg/dL — ABNORMAL LOW (ref 8.9–10.3)
Chloride: 105 mmol/L (ref 98–111)
Creatinine, Ser: 0.79 mg/dL (ref 0.44–1.00)
GFR calc Af Amer: >60 mL/min (ref >60)
GFR calc non Af Amer: >60 mL/min (ref >60)
Glucose, Bld: 142 mg/dL — ABNORMAL HIGH (ref 70–99)
Potassium: 3.9 mmol/L (ref 3.5–5.1)
Sodium: 137 mmol/L (ref 135–145)

## 2019-05-19 MED ORDER — CEPHALEXIN 500 MG PO CAPS
500.0000 mg | ORAL_CAPSULE | Freq: Three times a day (TID) | ORAL | Status: DC
  Administered 2019-05-19 – 2019-05-20 (×3): 500 mg via ORAL
  Filled 2019-05-19 (×4): qty 1

## 2019-05-19 MED ORDER — IPRATROPIUM-ALBUTEROL 0.5-2.5 (3) MG/3ML IN SOLN: 3.0000 mL | Freq: Four times a day (QID) | RESPIRATORY_TRACT | Status: DC

## 2019-05-19 NOTE — Progress Notes (Signed)
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  Subjective: Reports fever and chills overnight.  Also endorses no abdominal pain.  Pain is better this morning.  She rates her pain as 2 out of 7.  Reports associated back pain.  Dysuria improved.  No hematuria or frequency. Eager to go home.  Objective: Vitals:   05/19/19 0400 05/19/19 0745 05/19/19 0857 05/19/19 0901  BP: (!) 102/53     Pulse: 88 90    Resp: (!) 22 (!) 29    Temp:    98.6 F (37 C)  TempSrc:    Oral  SpO2: 95% 90% 93%   Weight:      Height:        Intake/Output Summary (Last 24 hours) at 05/19/2019 1149 Last data filed at 05/18/2019 2354 Gross per 24 hour  Intake 1371.78 ml  Output 150 ml  Net 1221.78 ml   Filed Weights   05/17/19 1257  Weight: 48.1 kg    Examination:  GENERAL: No acute distress.  Appears well.  HEENT: MMM.  Vision and hearing grossly intact.  NECK: Supple.  No apparent JVD.  RESP:  No IWOB. Good air movement bilaterally. CVS:  RRR. Heart sounds normal.  ABD/GI/GU: Bowel sounds present. Soft.  Mild diffuse tenderness across lower abdomen. MSK/EXT:  Moves extremities. No apparent deformity or edema.  SKIN: no apparent skin lesion or wound NEURO: Awake, alert and oriented appropriately.  No gross deficit.  PSYCH: Calm. Normal affect.   I have personally reviewed the following labs and images:  Radiology Studies: No results found.  Microbiology: Recent Results (from the past 240 hour(s))  Urine  culture     Status: Abnormal   Collection Time: 05/17/19  2:14 PM   Specimen: Urine, Clean Catch  Result Value Ref Range Status   Specimen Description   Final    URINE, CLEAN CATCH Performed at [facility], [address] [place], [place] [address]    Special Requests   Final    NONE Performed at [facility], [address] [place], [place] [address]    Culture 50,000 COLONIES/mL KLEBSIELLA PNEUMONIAE (A)  Final   Report Status 05/19/2019 FINAL  Final   Organism ID, Bacteria KLEBSIELLA PNEUMONIAE (A)  Final      Susceptibility   Klebsiella pneumoniae - MIC*    AMPICILLIN >=32 RESISTANT Resistant     CEFAZOLIN <=4 SENSITIVE Sensitive     CEFTRIAXONE <=1 SENSITIVE Sensitive     CIPROFLOXACIN <=0.25 SENSITIVE Sensitive     GENTAMICIN <=1 SENSITIVE Sensitive     IMIPENEM <=0.25 SENSITIVE Sensitive     NITROFURANTOIN 64 INTERMEDIATE Intermediate     TRIMETH/SULFA <=20 SENSITIVE Sensitive     AMPICILLIN/SULBACTAM 4 SENSITIVE Sensitive     PIP/TAZO <=4 SENSITIVE Sensitive     Extended ESBL NEGATIVE Sensitive     * 50,000 COLONIES/mL KLEBSIELLA PNEUMONIAE  SARS Coronavirus 2 (Performed in [place] hospital lab)     Status: None   Collection Time: 05/17/19  3:14 PM   Specimen: Nasopharyngeal Swab  Result Value Ref Range Status   SARS Coronavirus 2 NEGATIVE NEGATIVE Final    Comment: (NOTE) If result is NEGATIVE SARS-CoV-2 target nucleic acids are NOT DETECTED. The SARS-CoV-2 RNA is generally detectable in upper and lower  respiratory specimens during the acute phase of infection. The lowest  concentration of SARS-CoV-2 viral copies this assay can detect is 250  copies / mL. A negative result does not preclude SARS-CoV-2 infection  and should not be used as the sole basis for treatment or other  patient management decisions.  A negative result may occur with  improper specimen collection / handling, submission of specimen other  than nasopharyngeal swab,  presence of viral mutation(s) within the  areas targeted by this assay, and inadequate number of viral copies  (<250 copies / mL). A negative result must be combined with clinical  observations, patient history, and epidemiological information. If result is POSITIVE SARS-CoV-2 target nucleic acids are DETECTED. The SARS-CoV-2 RNA is generally detectable in upper and lower  respiratory specimens dur ing the acute phase of infection.  Positive  results are indicative of active infection with SARS-CoV-2.  Clinical  correlation with patient history and other diagnostic information is  necessary to determine patient infection status.  Positive results do  not rule out bacterial infection or co-infection with other viruses. If result is PRESUMPTIVE POSTIVE SARS-CoV-2 nucleic acids MAY BE PRESENT.   A presumptive positive result was obtained on the submitted specimen  and confirmed on repeat testing.  While 2019 novel coronavirus  (SARS-CoV-2) nucleic acids may be present in the submitted sample  additional confirmatory testing may be necessary for epidemiological  and / or clinical management purposes  to differentiate between  SARS-CoV-2 and other Sarbecovirus currently known to infect humans.  If clinically indicated additional testing with an alternate test  methodology [phone]([id number]) is advised. The SARS-CoV-2 RNA is generally  detectable in upper and lower respiratory sp ecimens during the acute  phase of infection. The expected result is Negative. Fact Sheet for Patients:  BoilerBrush.com.cyhttps://www.fda.gov/media/136312/download Fact Sheet for Healthcare Providers: https://pope.com/https://www.fda.gov/media/136313/download This test is not yet approved or cleared by the [place] FDA and has been authorized for detection and/or diagnosis of SARS-CoV-2 by FDA under an Emergency Use Authorization (EUA).  This EUA will remain in effect (meaning this test can be used) for the duration of the COVID-19 declaration under  Section 564(b)(1) of the Act, 21 U.S.C. section 360bbb-3(b)(1), unless the authorization is terminated or revoked sooner. Performed at [facility], [address] [place], [place] [address]   Culture, blood (x 2)     Status: None (Preliminary result)   Collection Time: 05/17/19  8:22 PM   Specimen: BLOOD  Result Value Ref Range Status   Specimen Description   Final    BLOOD RIGHT ANTECUBITAL Performed at [facility], [address] [place], [place] [address]    Special Requests   Final    BOTTLES DRAWN AEROBIC AND ANAEROBIC Blood Culture results may not be optimal due to an inadequate volume of blood received in culture bottles Performed at [facility], [address] [place], [place] [address]    Culture   Final    NO GROWTH 2 DAYS Performed at [facility], [address] [place], [place] [address]    Report Status PENDING  Incomplete  Culture, blood (x 2)     Status: None (Preliminary result)   Collection Time: 05/17/19  8:22 PM  Specimen: BLOOD  Result Value Ref Range Status   Specimen Description   Final    BLOOD LEFT ANTECUBITAL Performed at [facility], [address] [place], [place] [address]    Special Requests   Final    BOTTLES DRAWN AEROBIC AND ANAEROBIC Blood Culture results may not be optimal due to an excessive volume of blood received in culture bottles Performed at [facility], [address] [place], [place] [address]    Culture   Final    NO GROWTH 2 DAYS Performed at [facility], [address] [place], [place] [address]    Report Status PENDING  Incomplete  MRSA PCR Screening     Status: None   Collection Time: 05/17/19 10:20 PM   Specimen: Nasopharyngeal  Result Value Ref Range Status   MRSA by PCR NEGATIVE NEGATIVE Final    Comment:        The GeneXpert MRSA Assay (FDA approved for NASAL specimens only), is one component of a  comprehensive MRSA colonization surveillance program. It is not intended to diagnose MRSA infection nor to guide or monitor treatment for MRSA infections. Performed at [facility], [address] [place], [place] [address]     Sepsis Labs: Invalid input(s): PROCALCITONIN, LACTICIDVEN  Urine analysis:    Component Value Date/Time   COLORURINE YELLOW 05/17/2019 1414   APPEARANCEUR CLEAR 05/17/2019 1414   LABSPEC 1.010 05/17/2019 1414   PHURINE 7.0 05/17/2019 1414   GLUCOSEU NEGATIVE 05/17/2019 1414   HGBUR NEGATIVE 05/17/2019 1414   BILIRUBINUR NEGATIVE 05/17/2019 1414   BILIRUBINUR neg 07/31/2013 1033   KETONESUR NEGATIVE 05/17/2019 1414   PROTEINUR NEGATIVE 05/17/2019 1414   UROBILINOGEN 0.2 08/04/2013 1120   NITRITE NEGATIVE 05/17/2019 1414   LEUKOCYTESUR TRACE (A) 05/17/2019 1414    Anemia Panel: No results for input(s): VITAMINB12, FOLATE, FERRITIN, TIBC, IRON, RETICCTPCT in the last 72 hours.  Thyroid Function Tests: No results for input(s): TSH, T4TOTAL, FREET4, T3FREE, THYROIDAB in the last 72 hours.  Lipid Profile: No results for input(s): CHOL, HDL, LDLCALC, TRIG, CHOLHDL, LDLDIRECT in the last 72 hours.  CBG: Recent Labs  Lab 05/18/19 0735 05/18/19 1131 05/18/19 1628 05/18/19 2112 05/19/19 0738  GLUCAP 117* 153* 158* 150* 92    HbA1C: No results for input(s): HGBA1C in the last 72 hours.  BNP (last 3 results): No results for input(s): PROBNP in the last 8760 hours.  Cardiac Enzymes: No results for input(s): CKTOTAL, CKMB, CKMBINDEX, TROPONINI in the last 168 hours.  Coagulation Profile: Recent Labs  Lab 05/17/19 2024  INR 1.0    Liver Function Tests: Recent Labs  Lab 05/17/19 1414  AST 14*  ALT 13  ALKPHOS 77  BILITOT 0.5  PROT 6.9  ALBUMIN 3.9   No results for input(s): LIPASE, AMYLASE in the last 168 hours. No results for input(s): AMMONIA in the last 168 hours.  Basic Metabolic Panel: Recent Labs  Lab  05/17/19 1414 05/18/19 0239 05/19/19 0230  NA 139 138 137  K 4.1 3.7 3.9  CL 101 108 105  CO2 26 23 24   GLUCOSE 97 263* 142*  BUN 14 13 8   CREATININE 0.70 0.79 0.79  CALCIUM 9.0 7.7* 8.1*   GFR: Estimated Creatinine Clearance: 56.1 mL/min (by C-G formula based on SCr of 0.79 mg/dL).  CBC: Recent Labs  Lab 05/17/19 1414 05/18/19 0239 05/19/19 0230  WBC 17.5* 16.4* 16.7*  NEUTROABS 14.0* 13.0* 13.8*  HGB 15.1* 11.0* 11.1*  HCT 44.7 34.2* 35.2*  MCV 94.1 96.9 98.6  PLT 285 203 176    Procedures:  None  Microbiology summarized: COVID-19 negative. Blood cultures negative so far. Urine culture grew 50,000 Klebsiella pneumonia resistant to ampicillin and Macrobid.  Assessment & Plan: Sepsis due to recurrent UTI and patient with history of ureteral reflux status post ureteral reimplantation managed with daily Macrobid -Sepsis physiology improving.  Still with significant leukocytosis. -Urine culture grew 50,000 Klebsiella pneumonia resistant to ampicillin and Macrobid -Ceftriaxone 7/30-8/1 -Keflex 80/1-- -trend leukocytosis -Anticipate discharge home in the next 24-hour if she remains stable and leukocytosis resolve  Nephrolithiasis: CT renal stone revealed 1 mm stone in mid right kidney but no hydronephrosis or ureteral stones. -Encourage good hydration  Suboptimally controlled IDDM-2: A1c 8.4%.  CBG was in fair range. -Continue current insulin regimen  COPD: she has some tachypnea.  Diminished aeration bilaterally. -Incentive spirometry -Continue home Incruse Ellipta. -Add scheduled DuoNeb. -Incentive spirometry  DVT prophylaxis: Subcu Lovenox Code Status: Full code Family Communication: Patient and/or RN. Available if any question. Disposition Plan: Transfer to telemetry Consultants: None   Antimicrobials: Anti-infectives (From admission, onward)   Start     Dose/Rate Route Frequency Ordered Stop   05/19/19 1400  cephALEXin (KEFLEX) capsule 500 mg      500 mg Oral Every 8 hours 05/19/19 1149 05/24/19 1359   05/18/19 1600  cefTRIAXone (ROCEPHIN) 2 g in sodium chloride 0.9 % 100 mL IVPB  Status:  Discontinued     2 g 200 mL/hr over 30 Minutes Intravenous Every 24 hours 05/17/19 1939 05/19/19 1149   05/18/19 1000  fluconazole (DIFLUCAN) tablet 100 mg  Status:  Discontinued     100 mg Oral Daily 05/18/19 0928 05/18/19 0930   05/17/19 1645  cefTRIAXone (ROCEPHIN) 1 g in sodium chloride 0.9 % 100 mL IVPB     1 g 200 mL/hr over 30 Minutes Intravenous  Once 05/17/19 1630 05/17/19 1725   05/17/19 1530  cefTRIAXone (ROCEPHIN) 1 g in sodium chloride 0.9 % 100 mL IVPB     1 g 200 mL/hr over 30 Minutes Intravenous  Once 05/17/19 1524 05/17/19 1625      Sch Meds:  Scheduled Meds: . atorvastatin  20 mg Oral q1800  . cephALEXin  500 mg Oral Q8H  . Chlorhexidine Gluconate Cloth  6 each Topical Daily  . enoxaparin (LOVENOX) injection  40 mg Subcutaneous QHS  . fluticasone furoate-vilanterol  1 puff Inhalation Daily  . insulin aspart  0-5 Units Subcutaneous QHS  . insulin aspart  0-9 Units Subcutaneous TID WC  . ipratropium-albuterol  3 mL Nebulization Q6H  . pantoprazole  40 mg Oral Daily  . saccharomyces boulardii  250 mg Oral BID  . sodium chloride flush  3 mL Intravenous Q12H  . umeclidinium bromide  1 puff Inhalation Daily   Continuous Infusions: . sodium chloride Stopped (05/17/19 1749)  . sodium chloride    . lactated ringers 75 mL/hr at 05/18/19 2354   PRN Meds:.sodium chloride, sodium chloride, acetaminophen **OR** acetaminophen, albuterol, cyclobenzaprine, HYDROcodone-acetaminophen, LORazepam, morphine injection, ondansetron **OR** ondansetron (ZOFRAN) IV, polyethylene glycol, sodium chloride flush   Taye T. Gonfa [facility]  If 7PM-7AM, please contact night-coverage [email or web address] Password [email or web address] 05/19/2019, 11:49 AM

## 2019-05-20 ENCOUNTER — Inpatient Hospital Stay (HOSPITAL_COMMUNITY): Payer: BLUE CROSS/BLUE SHIELD

## 2019-05-20 DIAGNOSIS — J441 Chronic obstructive pulmonary disease with (acute) exacerbation: Secondary | ICD-10-CM

## 2019-05-20 DIAGNOSIS — D72825 Bandemia: Secondary | ICD-10-CM

## 2019-05-20 DIAGNOSIS — J9621 Acute and chronic respiratory failure with hypoxia: Secondary | ICD-10-CM

## 2019-05-20 LAB — BASIC METABOLIC PANEL
Anion gap: 6 (ref 5–15)
BUN: 9 mg/dL (ref 8–23)
CO2: 25 mmol/L (ref 22–32)
Calcium: 8.1 mg/dL — ABNORMAL LOW (ref 8.9–10.3)
Chloride: 105 mmol/L (ref 98–111)
Creatinine, Ser: 0.71 mg/dL (ref 0.44–1.00)
GFR calc Af Amer: >60 mL/min (ref >60)
GFR calc non Af Amer: >60 mL/min (ref >60)
Glucose, Bld: 162 mg/dL — ABNORMAL HIGH (ref 70–99)
Potassium: 3.6 mmol/L (ref 3.5–5.1)
Sodium: 136 mmol/L (ref 135–145)

## 2019-05-20 LAB — GLUCOSE, CAPILLARY
Glucose-Capillary: 120 mg/dL — ABNORMAL HIGH (ref 70–99)
Glucose-Capillary: 116 mg/dL — ABNORMAL HIGH (ref 70–99)
Glucose-Capillary: 237 mg/dL — ABNORMAL HIGH (ref 70–99)

## 2019-05-20 LAB — CBC
HCT: 33.3 % — ABNORMAL LOW (ref 36.0–46.0)
Hemoglobin: 10.3 g/dL — ABNORMAL LOW (ref 12.0–15.0)
MCH: 30.4 pg (ref 26.0–34.0)
MCHC: 30.9 g/dL (ref 30.0–36.0)
MCV: 98.2 fL (ref 80.0–100.0)
Platelets: 161 10*3/uL (ref 150–400)
RBC: 3.39 MIL/uL — ABNORMAL LOW (ref 3.87–5.11)
RDW: 13.1 % (ref 11.5–15.5)
WBC: 9.2 10*3/uL (ref 4.0–10.5)
nRBC: 0 % (ref 0.0–0.2)

## 2019-05-20 LAB — MAGNESIUM: Magnesium: 1.8 mg/dL (ref 1.7–2.4)

## 2019-05-20 MED ORDER — METHYLPREDNISOLONE SODIUM SUCC 125 MG IJ SOLR
60.0000 mg | Freq: Two times a day (BID) | INTRAMUSCULAR | Status: DC
  Administered 2019-05-20 (×2): 60 mg via INTRAVENOUS
  Filled 2019-05-20 (×2): qty 2

## 2019-05-20 MED ORDER — ALBUTEROL SULFATE (2.5 MG/3ML) 0.083% IN NEBU: 2.5000 mg | INHALATION_SOLUTION | RESPIRATORY_TRACT | Status: DC | PRN

## 2019-05-20 MED ORDER — IPRATROPIUM-ALBUTEROL 0.5-2.5 (3) MG/3ML IN SOLN: 3.0000 mL | Freq: Four times a day (QID) | RESPIRATORY_TRACT | Status: DC

## 2019-05-20 MED ORDER — ALBUTEROL SULFATE (2.5 MG/3ML) 0.083% IN NEBU: 2.5000 mg | INHALATION_SOLUTION | Freq: Two times a day (BID) | RESPIRATORY_TRACT | Status: DC

## 2019-05-20 MED ORDER — AMOXICILLIN-POT CLAVULANATE 875-125 MG PO TABS
1.0000 | ORAL_TABLET | Freq: Two times a day (BID) | ORAL | Status: DC
  Administered 2019-05-20 – 2019-05-21 (×3): 1 via ORAL
  Filled 2019-05-20 (×3): qty 1

## 2019-05-20 NOTE — Progress Notes (Signed)
SATURATION QUALIFICATIONS: (This note is used to comply with regulatory documentation for home oxygen)  Patient Saturations on Room Air at Rest = 92%  Patient Saturations on Room Air while Ambulating = 83-86%  Patient Saturations on 3 Liters of oxygen while Ambulating = 93%  Please briefly explain why patient needs home oxygen: Patient unable to maintain oxygen saturation greater than 92 % on room air

## 2019-05-20 NOTE — Progress Notes (Signed)
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  Subjective: No major events overnight of this morning.  Reportedly desaturated to 80s when she, of oxygen this morning.  She reports wet cough.  Denies chest pain or shortness of breath.  Denies GI or GU symptoms.  Of note, patient states penicillin allergy is remote and recently used amoxicillin without any issue.  Very eager to go home.  Objective: Vitals:   05/20/19 0300 05/20/19 0333 05/20/19 0759 05/20/19 0800  BP: (!) 99/45   131/62  Pulse: 64   78  Resp: 16   (!) 21  Temp:  98.8 F (37.1 C) 99.2 F (37.3 C)   TempSrc:  Oral Oral   SpO2: 95%   98%  Weight:      Height:        Intake/Output Summary (Last 24 hours) at 05/20/2019 1058 Last data filed at 05/19/2019 1157 Gross per 24 hour  Intake 899.53 ml  Output -  Net 899.53 ml   Filed Weights   05/17/19 1257  Weight: 48.1 kg    Examination:  GENERAL: No acute distress.  Appears well.  HEENT: MMM.  Vision and hearing grossly intact.  NECK: Supple.  No apparent JVD.  RESP:  No IWOB.  Fair air movement bilaterally. CVS:  RRR. Heart sounds normal.  ABD/GI/GU: Bowel sounds present. Soft.  Mild diffuse tenderness across lower abdomen. MSK/EXT:  Moves extremities. No apparent deformity or edema.  SKIN: no apparent skin lesion or wound NEURO: Awake, alert and oriented appropriately.  No gross deficit.  PSYCH: Calm. Normal affect.    I have personally reviewed the following labs and images:   Radiology Studies: No results found.  Microbiology: Recent Results (from the past 240 hour(s))  Urine culture     Status: Abnormal   Collection Time: 05/17/19  2:14 PM   Specimen: Urine, Clean Catch  Result Value Ref Range Status   Specimen Description   Final    URINE, CLEAN CATCH Performed at [facility] [place], [address] [place], [place] [address]    Special Requests   Final    NONE Performed at [facility], [address] [place], [place] [address]    Culture 50,000 COLONIES/mL KLEBSIELLA PNEUMONIAE (A)  Final   Report Status 05/19/2019 FINAL  Final   Organism ID, Bacteria KLEBSIELLA PNEUMONIAE (A)  Final      Susceptibility   Klebsiella pneumoniae - MIC*    AMPICILLIN >=32 RESISTANT Resistant     CEFAZOLIN <=4 SENSITIVE Sensitive     CEFTRIAXONE <=1 SENSITIVE Sensitive     CIPROFLOXACIN <=0.25 SENSITIVE Sensitive     GENTAMICIN <=1 SENSITIVE Sensitive     IMIPENEM <=0.25 SENSITIVE Sensitive     NITROFURANTOIN 64 INTERMEDIATE Intermediate     TRIMETH/SULFA <=20 SENSITIVE Sensitive     AMPICILLIN/SULBACTAM 4 SENSITIVE Sensitive     PIP/TAZO <=4 SENSITIVE Sensitive     Extended ESBL NEGATIVE Sensitive     * 50,000 COLONIES/mL KLEBSIELLA PNEUMONIAE  SARS Coronavirus 2 (Performed in [facility] hospital lab)  Status: None   Collection Time: 05/17/19  3:14 PM   Specimen: Nasopharyngeal Swab  Result Value Ref Range Status   SARS Coronavirus 2 NEGATIVE NEGATIVE Final    Comment: (NOTE) If result is NEGATIVE SARS-CoV-2 target nucleic acids are NOT DETECTED. The SARS-CoV-2 RNA is generally detectable in upper and lower  respiratory specimens during the acute phase of infection. The lowest  concentration of SARS-CoV-2 viral copies this assay can detect is 250  copies / mL. A negative result does not preclude SARS-CoV-2 infection  and should not be used as the sole basis for treatment or other  patient management decisions.  A negative result may  occur with  improper specimen collection / handling, submission of specimen other  than nasopharyngeal swab, presence of viral mutation(s) within the  areas targeted by this assay, and inadequate number of viral copies  (<250 copies / mL). A negative result must be combined with clinical  observations, patient history, and epidemiological information. If result is POSITIVE SARS-CoV-2 target nucleic acids are DETECTED. The SARS-CoV-2 RNA is generally detectable in upper and lower  respiratory specimens dur ing the acute phase of infection.  Positive  results are indicative of active infection with SARS-CoV-2.  Clinical  correlation with patient history and other diagnostic information is  necessary to determine patient infection status.  Positive results do  not rule out bacterial infection or co-infection with other viruses. If result is PRESUMPTIVE POSTIVE SARS-CoV-2 nucleic acids MAY BE PRESENT.   A presumptive positive result was obtained on the submitted specimen  and confirmed on repeat testing.  While 2019 novel coronavirus  (SARS-CoV-2) nucleic acids may be present in the submitted sample  additional confirmatory testing may be necessary for epidemiological  and / or clinical management purposes  to differentiate between  SARS-CoV-2 and other Sarbecovirus currently known to infect humans.  If clinically indicated additional testing with an alternate test  methodology [phone]) is advised. The SARS-CoV-2 RNA is generally  detectable in upper and lower respiratory sp ecimens during the acute  phase of infection. The expected result is Negative. Fact Sheet for Patients:  BoilerBrush.com.cy Fact Sheet for Healthcare Providers: https://pope.com/ This test is not yet approved or cleared by the [place] FDA and has been authorized for detection and/or diagnosis of SARS-CoV-2 by FDA under an Emergency Use Authorization (EUA).  This  EUA will remain in effect (meaning this test can be used) for the duration of the COVID-19 declaration under Section 564(b)(1) of the Act, 21 U.S.C. section 360bbb-3(b)(1), unless the authorization is terminated or revoked sooner. Performed at [facility], [address] [place], [place] [address]   Culture, blood (x 2)     Status: None (Preliminary result)   Collection Time: 05/17/19  8:22 PM   Specimen: BLOOD  Result Value Ref Range Status   Specimen Description   Final    BLOOD RIGHT ANTECUBITAL Performed at [facility], [address], [place], [place] [address]    Special Requests   Final    BOTTLES DRAWN AEROBIC AND ANAEROBIC Blood Culture results may not be optimal due to an inadequate volume of blood received in culture bottles Performed at [facility], [address] [place], [place] [address]    Culture   Final    NO GROWTH 3 DAYS Performed at [facility], [address], [place], [place] [address]    Report Status PENDING  Incomplete  Culture, blood (x 2)  Status: None (Preliminary result)   Collection Time: 05/17/19  8:22 PM   Specimen: BLOOD  Result Value Ref Range Status   Specimen Description   Final    BLOOD LEFT ANTECUBITAL Performed at [facility], [address] [place], [place] [address]    Special Requests   Final    BOTTLES DRAWN AEROBIC AND ANAEROBIC Blood Culture results may not be optimal due to an excessive volume of blood received in culture bottles Performed at [facility], [address] [place], [place] [address]    Culture   Final    NO GROWTH 3 DAYS Performed at [facility], [address] [place], [place] [address]    Report Status PENDING  Incomplete  MRSA PCR Screening     Status: None   Collection Time: 05/17/19 10:20 PM   Specimen: Nasopharyngeal  Result Value Ref Range Status   MRSA by PCR NEGATIVE NEGATIVE Final     Comment:        The GeneXpert MRSA Assay (FDA approved for NASAL specimens only), is one component of a comprehensive MRSA colonization surveillance program. It is not intended to diagnose MRSA infection nor to guide or monitor treatment for MRSA infections. Performed at [facility], [address] [place], [place] [address]     Sepsis Labs: Invalid input(s): PROCALCITONIN, LACTICIDVEN  Urine analysis:    Component Value Date/Time   COLORURINE YELLOW 05/17/2019 1414   APPEARANCEUR CLEAR 05/17/2019 1414   LABSPEC 1.010 05/17/2019 1414   PHURINE 7.0 05/17/2019 1414   GLUCOSEU NEGATIVE 05/17/2019 1414   HGBUR NEGATIVE 05/17/2019 1414   BILIRUBINUR NEGATIVE 05/17/2019 1414   BILIRUBINUR neg 07/31/2013 1033   KETONESUR NEGATIVE 05/17/2019 1414   PROTEINUR NEGATIVE 05/17/2019 1414   UROBILINOGEN 0.2 08/04/2013 1120   NITRITE NEGATIVE 05/17/2019 1414   LEUKOCYTESUR TRACE (A) 05/17/2019 1414    Anemia Panel: No results for input(s): VITAMINB12, FOLATE, FERRITIN, TIBC, IRON, RETICCTPCT in the last 72 hours.  Thyroid Function Tests: No results for input(s): TSH, T4TOTAL, FREET4, T3FREE, THYROIDAB in the last 72 hours.  Lipid Profile: No results for input(s): CHOL, HDL, LDLCALC, TRIG, CHOLHDL, LDLDIRECT in the last 72 hours.  CBG: Recent Labs  Lab 05/19/19 0738 05/19/19 1201 05/19/19 1639 05/19/19 2142 05/20/19 0731  GLUCAP 92 183* 154* 140* 120*    HbA1C: No results for input(s): HGBA1C in the last 72 hours.  BNP (last 3 results): No results for input(s): PROBNP in the last 8760 hours.  Cardiac Enzymes: No results for input(s): CKTOTAL, CKMB, CKMBINDEX, TROPONINI in the last 168 hours.  Coagulation Profile: Recent Labs  Lab 05/17/19 2024  INR 1.0    Liver Function Tests: Recent Labs  Lab 05/17/19 1414  AST 14*  ALT 13  ALKPHOS 77  BILITOT 0.5  PROT 6.9  ALBUMIN 3.9   No results for input(s): LIPASE, AMYLASE in the last 168  hours. No results for input(s): AMMONIA in the last 168 hours.  Basic Metabolic Panel: Recent Labs  Lab 05/17/19 1414 05/18/19 0239 05/19/19 0230 05/20/19 0324  NA 139 138 137 136  K 4.1 3.7 3.9 3.6  CL 101 108 105 105  CO2 26 23 24 25   GLUCOSE 97 263* 142* 162*  BUN 14 13 8 9   CREATININE 0.70 0.79 0.79 0.71  CALCIUM 9.0 7.7* 8.1* 8.1*  MG  --   --   --  1.8   GFR: Estimated Creatinine Clearance: 56.1 mL/min (by  C-G formula based on SCr of 0.71 mg/dL).  CBC: Recent Labs  Lab 05/17/19 1414 05/18/19 0239 05/19/19 0230 05/20/19 0324  WBC 17.5* 16.4* 16.7* 9.2  NEUTROABS 14.0* 13.0* 13.8*  --   HGB 15.1* 11.0* 11.1* 10.3*  HCT 44.7 34.2* 35.2* 33.3*  MCV 94.1 96.9 98.6 98.2  PLT 285 203 176 161    Procedures:  None  Microbiology summarized: COVID-19 negative. Blood cultures negative so far. Urine culture grew 50,000 Klebsiella pneumonia resistant to ampicillin and Macrobid.  Assessment & Plan: Sepsis due to recurrent UTI and patient with history of ureteral reflux status post ureteral reimplantation managed with daily Macrobid -Sepsis physiology and leukocytosis resolved. -Urine culture grew 50,000 Klebsiella pneumonia resistant to ampicillin and Macrobid -Ceftriaxone 7/30-8/1. Keflex 80/1-8/2 -Augmentin 8/2-- -Recommend outpatient follow-up with urology.  Acute respiratory failure with hypoxia due to COPD exacerbation: Recent portable chest x-ray without acute finding but COPD.  Patient has some wet cough -Change Keflex to Augmentin -IV Solu-Medrol, scheduled DuoNeb and PRN albuterol -Incentive spirometry and mucolytic's  Nephrolithiasis: CT renal stone revealed 1 mm stone in mid right kidney but no hydronephrosis or ureteral stones. -Encourage good hydration  Suboptimally controlled IDDM-2: A1c 8.4%.  CBG was in fair range. -Continue current insulin regimen  DVT prophylaxis: Subcu Lovenox Code Status: Full code Family Communication: Patient and/or RN.  Available if any question. Disposition Plan: Transfer to MedSurg.  Remains inpatient due to respiratory failure with hypoxia and oxygen requirement. Consultants: None   Antimicrobials: Anti-infectives (From admission, onward)   Start     Dose/Rate Route Frequency Ordered Stop   05/20/19 1100  amoxicillin-clavulanate (AUGMENTIN) 875-125 MG per tablet 1 tablet     1 tablet Oral Every 12 hours 05/20/19 1043 05/25/19 0959   05/19/19 1400  cephALEXin (KEFLEX) capsule 500 mg  Status:  Discontinued     500 mg Oral Every 8 hours 05/19/19 1149 05/20/19 1043   05/18/19 1600  cefTRIAXone (ROCEPHIN) 2 g in sodium chloride 0.9 % 100 mL IVPB  Status:  Discontinued     2 g 200 mL/hr over 30 Minutes Intravenous Every 24 hours 05/17/19 1939 05/19/19 1149   05/18/19 1000  fluconazole (DIFLUCAN) tablet 100 mg  Status:  Discontinued     100 mg Oral Daily 05/18/19 0928 05/18/19 0930   05/17/19 1645  cefTRIAXone (ROCEPHIN) 1 g in sodium chloride 0.9 % 100 mL IVPB     1 g 200 mL/hr over 30 Minutes Intravenous  Once 05/17/19 1630 05/17/19 1725   05/17/19 1530  cefTRIAXone (ROCEPHIN) 1 g in sodium chloride 0.9 % 100 mL IVPB     1 g 200 mL/hr over 30 Minutes Intravenous  Once 05/17/19 1524 05/17/19 1625      Sch Meds:  Scheduled Meds: . amoxicillin-clavulanate  1 tablet Oral Q12H  . atorvastatin  20 mg Oral q1800  . Chlorhexidine Gluconate Cloth  6 each Topical Daily  . enoxaparin (LOVENOX) injection  40 mg Subcutaneous QHS  . fluticasone furoate-vilanterol  1 puff Inhalation Daily  . insulin aspart  0-5 Units Subcutaneous QHS  . insulin aspart  0-9 Units Subcutaneous TID WC  . ipratropium-albuterol  3 mL Nebulization Q6H  . methylPREDNISolone (SOLU-MEDROL) injection  60 mg Intravenous Q12H  . pantoprazole  40 mg Oral Daily  . saccharomyces boulardii  250 mg Oral BID  . sodium chloride flush  3 mL Intravenous Q12H  . umeclidinium bromide  1 puff Inhalation Daily   Continuous Infusions: . sodium  chloride Stopped (  05/17/19 1749)  . sodium chloride     PRN Meds:.sodium chloride, sodium chloride, acetaminophen **OR** acetaminophen, albuterol, cyclobenzaprine, HYDROcodone-acetaminophen, LORazepam, morphine injection, ondansetron **OR** ondansetron (ZOFRAN) IV, polyethylene glycol, sodium chloride flush   Ciera Beckum T. [place]  If 7PM-7AM, please contact night-coverage [email or web address] Password [email or web address] 05/20/2019, 10:58 AM

## 2019-05-21 LAB — GLUCOSE, CAPILLARY
Glucose-Capillary: 262 mg/dL — ABNORMAL HIGH (ref 70–99)
Glucose-Capillary: 246 mg/dL — ABNORMAL HIGH (ref 70–99)

## 2019-05-21 LAB — CBC
HCT: 37.9 % (ref 36.0–46.0)
Hemoglobin: 12 g/dL (ref 12.0–15.0)
MCH: 30.8 pg (ref 26.0–34.0)
MCHC: 31.7 g/dL (ref 30.0–36.0)
MCV: 97.4 fL (ref 80.0–100.0)
Platelets: 214 10*3/uL (ref 150–400)
RBC: 3.89 MIL/uL (ref 3.87–5.11)
RDW: 12.8 % (ref 11.5–15.5)
WBC: 3.3 10*3/uL — ABNORMAL LOW (ref 4.0–10.5)
nRBC: 0 % (ref 0.0–0.2)

## 2019-05-21 LAB — HEMOGLOBIN A1C
Hgb A1c MFr Bld: 8.2 % — ABNORMAL HIGH (ref 4.8–5.6)
Mean Plasma Glucose: 188.64 mg/dL

## 2019-05-21 LAB — MAGNESIUM: Magnesium: 2.1 mg/dL (ref 1.7–2.4)

## 2019-05-21 MED ORDER — PREDNISONE 50 MG PO TABS: 50.0000 mg | ORAL_TABLET | Freq: Every day | ORAL | 0 refills | Status: AC

## 2019-05-21 MED ORDER — INSULIN ASPART 100 UNIT/ML ~~LOC~~ SOLN
0.0000 [IU] | Freq: Three times a day (TID) | SUBCUTANEOUS | Status: DC
  Administered 2019-05-21: 5 [IU] via SUBCUTANEOUS

## 2019-05-21 MED ORDER — INSULIN ASPART 100 UNIT/ML ~~LOC~~ SOLN: 0.0000 [IU] | Freq: Every day | SUBCUTANEOUS | Status: DC

## 2019-05-21 MED ORDER — AMOXICILLIN-POT CLAVULANATE 875-125 MG PO TABS: 1.0000 | ORAL_TABLET | Freq: Two times a day (BID) | ORAL | 0 refills | Status: DC

## 2019-05-21 NOTE — Progress Notes (Addendum)
SATURATION QUALIFICATIONS: (This note is used to comply with regulatory documentation for home oxygen)  Patient Saturations on Room Air at Rest = 97%  Patient Saturations on Room Air while Ambulating = 89%  Patient Saturations on 2 Liters of oxygen while Ambulating = 97%  Please briefly explain why patient needs home oxygen:

## 2019-05-21 NOTE — Discharge Summary (Signed)
Physician Discharge Summary  Tawania Daponte WUJ:[id number] DOB: 19-Jun-1956 DOA: 05/17/2019  PCP: System, Pcp Not In  Admit date: 05/17/2019 Discharge date: 05/21/2019  Admitted From: Home Disposition: Home  Recommendations for Outpatient Follow-up:  1. Follow up with PCP and urology in 1-2 weeks 2. Please obtain CBC/BMP/Mag at follow up 3. 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  Urine culture grew Klebsiella pneumonia resistant to ampicillin and Macrobid.  Treated with IV ceftriaxone and transitioned to oral Augmentin which could give her coverage for his COPD exacerbation as well.  See individual problem list below for more.   Discharge Diagnoses:  Sepsis due to recurrent UTI and patient with history of ureteral reflux status post ureteral reimplantation managed with daily Macrobid -Sepsis physiology and leukocytosis resolved. -Urine culture grew 50,000 Klebsiella pneumonia resistant to ampicillin and Macrobid -Ceftriaxone 7/30-8/1. Keflex 80/1-8/2 -Augmentin 8/2-8/7 -Recommend outpatient follow-up with PCP and urology urology in 1 to 2 weeks. -Patient may need suppressive therapy for recurrent UTI.  Acute respiratory failure with hypoxia due to COPD exacerbation:  desaturated to 83% with ambulation on room air.  Two-view chest x-ray without acute finding but atelectasis and known COPD. Patient has some wet cough.  Started on IV Solu-Medrol and antibiotics as above.  Respiratory status  improved.  Maintain saturation greater than 89% with ambulation on room air on the day of discharge -Discharged on Augmentin as above -Prednisone 50 mg daily to complete 5 days course -Patient will continue home Incruse Ellipta and PRN breathing treatments.  Nephrolithiasis: CT renal stone revealed 1 mm stone in mid right kidney but no hydronephrosis or ureteral stones. -Encourage good hydration  Suboptimally controlled IDDM-2: A1c 8.4%.  CBG slightly elevated likely due to steroid. -Discharged on home regimen.  Discharge Instructions  Discharge Instructions    Diet - low sodium heart healthy   Complete by: As directed    Diet Carb Modified   Complete by: As directed    Discharge instructions   Complete by: As directed    It has been a pleasure taking care of you! You were admitted with urinary tract infection.  You were treated with antibiotics.  With that, your symptoms improved to the point we think is safe to let you go home and continue your antibiotics.  We also started you on prednisone (steroid) for you COPD.  Please continue taking until you complete the course.  Please review your new medication list and the directions before you take your medications. Please call your primary care office and urologist as soon as possible to schedule hospital follow-up visit in 1 to 2 weeks.  Take care,   Increase activity slowly   Complete by: As directed      Allergies as of 05/21/2019      Reactions   Cefdinir Hives, Itching, Rash   blister   Sulfa Antibiotics Anaphylaxis, Shortness Of Breath   Codeine Nausea Only   Erythromycin Hives   Gabapentin Nausea And Vomiting   GI upset, pt claims it made her "drunk"   Metformin And Related Nausea And Vomiting   Symbicort [budesonide-formoterol Fumarate]    Breaks  mouth out      Medication List    STOP taking these medications   meloxicam 15 MG tablet Commonly known as: MOBIC   nitrofurantoin (macrocrystal-monohydrate) 100 MG  capsule Commonly known as: MACROBID     TAKE these medications   acetaminophen 500 MG tablet Commonly known as: TYLENOL Take 1,000 mg by mouth every 6 (six) hours as needed for pain.   albuterol (2.5 MG/3ML) 0.083% nebulizer solution Commonly known as: PROVENTIL USE 1 VIAL IN NEBULE EVERY 6 HOURS What changed: See the new instructions.   amoxicillin-clavulanate 875-125 MG tablet Commonly known as: AUGMENTIN Take 1 tablet by mouth every 12 (twelve) hours.   atorvastatin 20 MG tablet Commonly known as: LIPITOR TAKE 1 TABLET (20 MG TOTAL) BY MOUTH DAILY. What changed: See the new instructions.   Breo Ellipta 200-25 MCG/INH Aepb Generic drug: fluticasone furoate-vilanterol Inhale 1 puff into the lungs daily.   glipiZIDE 5 MG tablet Commonly known as: GLUCOTROL TAKE 1 TABLET (5 MG TOTAL) BY MOUTH 2 (TWO) TIMES DAILY BEFORE A MEAL. What changed: See the new instructions.   Incruse Ellipta 62.5 MCG/INH Aepb Generic drug: umeclidinium bromide Inhale 1 puff into the lungs daily.   Lantus SoloStar 100 UNIT/ML Solostar Pen Generic drug: Insulin Glargine Inject 15-20 Units into the skin daily.   lisinopril 2.5 MG tablet Commonly known as: ZESTRIL Take 2.5 mg by mouth daily.   omeprazole 20 MG capsule Commonly known as: PRILOSEC Take 20 mg by mouth daily.   OVER THE COUNTER MEDICATION Take 1 tablet by mouth daily. Hair, Skin and Nails   predniSONE 50 MG tablet Commonly known as: DELTASONE Take 1 tablet (50 mg total) by mouth daily with breakfast for 3 days. Start taking on: May 22, 2019   Premarin 1.25 MG tablet Generic drug: estrogens (conjugated) TAKE 1 TABLET (1.25 MG TOTAL) BY MOUTH DAILY. What changed: See the new instructions.       Consultations:  None  Procedures/Studies:  2D Echo: none  Dg Chest 2 View  Result Date: 05/20/2019 CLINICAL DATA:  Hypoxemia EXAM: CHEST - 2 VIEW COMPARISON:  Chest radiograph 05/17/2019 FINDINGS: Monitoring leads  overlie the patient. Normal cardiac and mediastinal contours. Minimal heterogeneous opacities left lung base. No pleural effusion or pneumothorax. Regional skeleton is unremarkable. Possible nipple shadows projecting over the lower lungs bilaterally. IMPRESSION: Minimal heterogeneous opacities left lung base favored represent atelectasis. Electronically Signed   By: Annia Beltrew  Davis M.D.   On: 05/20/2019 11:52   Dg Chest Port 1 View  Result Date: 05/17/2019 CLINICAL DATA:  Sepsis EXAM: PORTABLE CHEST 1 VIEW COMPARISON:  08/08/2017 FINDINGS: The heart size and mediastinal contours are within normal limits. Both lungs are clear. The visualized skeletal structures are unremarkable. IMPRESSION: No active disease. Electronically Signed   By: Marlan Palauharles  Clark M.D.   On: 05/17/2019 19:45   Ct Renal Stone Study  Result Date: 05/17/2019 CLINICAL DATA:  Flank pain and dysuria EXAM: CT ABDOMEN AND PELVIS WITHOUT CONTRAST TECHNIQUE: Multidetector CT imaging of the abdomen and pelvis was performed following the standard protocol without oral or IV contrast. COMPARISON:  August 08, 2017 FINDINGS: Lower chest: Lung bases are clear. Hepatobiliary: There are occasional small calcifications in the liver consistent with granulomas, stable. There is fatty infiltration near the fissure for the ligamentum teres. No other focal liver lesions are evident on this noncontrast enhanced study. The gallbladder wall is not appreciably thickened. There is no evident biliary duct dilatation. Pancreas: There is no pancreatic mass or  inflammatory focus. Spleen: No splenic lesions are evident. Adrenals/Urinary Tract: Adrenals appear normal bilaterally. There is scarring along the posterior upper pole left kidney with a 1 mm calcification in this area of scarring, stable. There is a 1 x 1 cm cyst in this area of scarring in the upper pole left kidney posteriorly as well. There is no appreciable hydronephrosis on either side. There is a 1 mm calculus  in the mid right kidney. There is no appreciable ureteral calculus on either side. Note that there are vascular calcifications immediately adjacent to the left ureter in the pelvis but separate from the left ureter, not felt to be changed compared to previous study. Urinary bladder wall thickness is within normal limits. Stomach/Bowel: There is no appreciable bowel wall or mesenteric thickening. There are occasional descending colonic and sigmoid diverticula without diverticulitis. There is no bowel obstruction. Terminal ileum appears normal. There is no evident free air or portal venous air. Vascular/Lymphatic: There is aortic and iliac artery atherosclerosis. There is slight aortic ectasia without frank aneurysmal dilatation, stable. There is no evident adenopathy in the abdomen or pelvis. Reproductive: Uterus absent.  No pelvic mass evident. Other: Appendix appears normal. No abscess or ascites evident in the abdomen or pelvis. Musculoskeletal: There are no blastic or lytic bone lesions. There is no intramuscular or abdominal wall lesion. IMPRESSION: 1. There is a 1 mm calculus in the mid right kidney. No appreciable hydronephrosis or ureteral calculus on either side. Urinary bladder wall thickness normal. 2. Stable scarring along the posterior aspect of the upper pole left kidney. A 1 mm calcification in this area could represent a tiny calculus but is more likely of inflammatory etiology given the scarring. 3. Occasional left-sided colonic diverticula without diverticulitis. No bowel obstruction. No abscess in the abdomen or pelvis. Appendix appears normal. 4.  Aortoiliac atherosclerosis.  Slight aortic ectasia is stable. 5.  Uterus absent. Electronically Signed   By: Lowella Grip III M.D.   On: 05/17/2019 16:01      Subjective: No major events overnight of this morning.  Spend the night of oxygen.  She said she ambulated in the room without any issues.  Denies chest pain or dyspnea.  Still with some  cough but improved.  Denies GI or GU symptoms.  Very eager to go home.   Discharge Exam: Vitals:   05/21/19 0600 05/21/19 0751  BP: (!) 119/56   Pulse: 63   Resp: 18   Temp:  98 F (36.7 C)  SpO2: 91% 94%    GENERAL: No acute distress.  Appears well.  HEENT: MMM.  Vision and hearing grossly intact.  NECK: Supple.  No JVD.  LUNGS:  No IWOB.  Slightly diminished aeration bilaterally with mild rhonchi. HEART:  RRR. Heart sounds normal.  ABD: Bowel sounds present. Soft. Non tender.  MSK/EXT:  Moves all extremities. No apparent deformity. No edema bilaterally. SKIN: no apparent skin lesion or wound NEURO: Awake, alert and oriented appropriately.  No gross deficit.  PSYCH: Calm. Normal affect.   The results of significant diagnostics from this hospitalization (including imaging, microbiology, ancillary and laboratory) are listed below for reference.     Microbiology: Recent Results (from the past 240 hour(s))  Urine culture     Status: Abnormal   Collection Time: 05/17/19  2:14 PM   Specimen: Urine, Clean Catch  Result Value Ref Range Status   Specimen Description   Final    URINE, CLEAN CATCH Performed at [facility], [address] [place], [place] [address]    Special Requests   Final    NONE Performed at [facility], [address] [place], [place] [address]    Culture 50,000 COLONIES/mL KLEBSIELLA PNEUMONIAE (A)  Final   Report Status 05/19/2019 FINAL  Final   Organism ID, Bacteria KLEBSIELLA PNEUMONIAE (A)  Final      Susceptibility   Klebsiella pneumoniae - MIC*    AMPICILLIN >=32 RESISTANT Resistant     CEFAZOLIN <=4 SENSITIVE Sensitive     CEFTRIAXONE <=1 SENSITIVE Sensitive     CIPROFLOXACIN <=0.25 SENSITIVE Sensitive     GENTAMICIN <=1 SENSITIVE Sensitive     IMIPENEM <=0.25 SENSITIVE Sensitive     NITROFURANTOIN 64 INTERMEDIATE Intermediate     TRIMETH/SULFA <=20 SENSITIVE Sensitive     AMPICILLIN/SULBACTAM 4 SENSITIVE Sensitive      PIP/TAZO <=4 SENSITIVE Sensitive     Extended ESBL NEGATIVE Sensitive     * 50,000 COLONIES/mL KLEBSIELLA PNEUMONIAE  SARS Coronavirus 2 (Performed in [facility] hospital lab)     Status: None   Collection Time: 05/17/19  3:14 PM   Specimen: Nasopharyngeal Swab  Result Value Ref Range Status   SARS Coronavirus 2 NEGATIVE NEGATIVE Final    Comment: (NOTE) If result is NEGATIVE SARS-CoV-2 target nucleic acids are NOT DETECTED. The SARS-CoV-2 RNA is generally detectable in upper and lower  respiratory specimens during the acute phase of infection. The lowest  concentration of SARS-CoV-2 viral copies this assay can detect is 250  copies / mL. A negative result does not preclude SARS-CoV-2 infection  and should not be used as the sole basis for treatment or other  patient management decisions.  A negative result may occur with  improper specimen collection / handling, submission of specimen other  than nasopharyngeal swab, presence of viral mutation(s) within the  areas targeted by this assay, and inadequate number of viral copies  (<250 copies / mL). A negative result must be combined with clinical  observations, patient history, and epidemiological information. If result is POSITIVE SARS-CoV-2 target nucleic acids are DETECTED. The SARS-CoV-2 RNA is generally detectable in upper and lower  respiratory specimens dur ing the acute phase of infection.  Positive  results are indicative of active infection with SARS-CoV-2.  Clinical  correlation with patient history and other diagnostic information is  necessary to determine patient infection status.  Positive results do  not rule out bacterial infection or co-infection with other viruses. If result is PRESUMPTIVE POSTIVE SARS-CoV-2 nucleic acids MAY BE PRESENT.   A presumptive positive result was obtained on the submitted specimen  and confirmed on repeat testing.  While 2019 novel coronavirus  (SARS-CoV-2) nucleic acids may be  present in the submitted sample  additional confirmatory testing may be necessary for epidemiological  and / or clinical management purposes  to differentiate between  SARS-CoV-2 and other Sarbecovirus currently known to infect humans.  If clinically indicated additional testing with an alternate test  methodology [phone]) is advised. The SARS-CoV-2 RNA is generally  detectable in upper and lower respiratory sp ecimens during the acute  phase of infection. The expected result is Negative. Fact Sheet for Patients:  BoilerBrush.com.cy Fact Sheet for Healthcare Providers: https://pope.com/ This test is not yet approved or cleared by the [place] FDA and has been authorized for detection and/or diagnosis of SARS-CoV-2 by FDA under an Emergency Use Authorization (EUA).  This EUA will remain in effect (meaning this test can be used)  for the duration of the COVID-19 declaration under Section 564(b)(1) of the Act, 21 U.S.C. section 360bbb-3(b)(1), unless the authorization is terminated or revoked sooner. Performed at [facility], [address] [place], [place] [address]   Culture, blood (x 2)     Status: None (Preliminary result)   Collection Time: 05/17/19  8:22 PM   Specimen: BLOOD  Result Value Ref Range Status   Specimen Description   Final    BLOOD RIGHT ANTECUBITAL Performed at [facility], [address] [place], [place] [address]    Special Requests   Final    BOTTLES DRAWN AEROBIC AND ANAEROBIC Blood Culture results may not be optimal due to an inadequate volume of blood received in culture bottles Performed at [facility], [address] [place], [place] [address]    Culture   Final    NO GROWTH 3 DAYS Performed at [facility], [address] [place], [place] [address]    Report Status PENDING  Incomplete  Culture, blood (x 2)     Status: None (Preliminary  result)   Collection Time: 05/17/19  8:22 PM   Specimen: BLOOD  Result Value Ref Range Status   Specimen Description   Final    BLOOD LEFT ANTECUBITAL Performed at [facility], [address] [place], [place] [address]    Special Requests   Final    BOTTLES DRAWN AEROBIC AND ANAEROBIC Blood Culture results may not be optimal due to an excessive volume of blood received in culture bottles Performed at [facility], [address] [place], [place] [address]    Culture   Final    NO GROWTH 3 DAYS Performed at [facility], [address] [place], [place] [address]    Report Status PENDING  Incomplete  MRSA PCR Screening     Status: None   Collection Time: 05/17/19 10:20 PM   Specimen: Nasopharyngeal  Result Value Ref Range Status   MRSA by PCR NEGATIVE NEGATIVE Final    Comment:        The GeneXpert MRSA Assay (FDA approved for NASAL specimens only), is one component of a comprehensive MRSA colonization surveillance program. It is not intended to diagnose MRSA infection nor to guide or monitor treatment for MRSA infections. Performed at [facility], [address] [place], [place] [address]      Labs: BNP (last 3 results) No results for input(s): BNP in the last 8760 hours. Basic Metabolic Panel: Recent Labs  Lab 05/17/19 1414 05/18/19 0239 05/19/19 0230 05/20/19 0324 05/21/19 0228  NA 139 138 137 136  --   K 4.1 3.7 3.9 3.6  --   CL 101 108 105 105  --   CO2 26 23 24 25   --   GLUCOSE 97 263* 142* 162*  --   BUN 14 13 8 9   --   CREATININE 0.70 0.79 0.79 0.71  --   CALCIUM 9.0 7.7* 8.1* 8.1*  --   MG  --   --   --  1.8 2.1   Liver Function Tests: Recent Labs  Lab 05/17/19 1414  AST 14*  ALT 13  ALKPHOS 77  BILITOT 0.5  PROT 6.9  ALBUMIN 3.9   No results for input(s): LIPASE, AMYLASE in the last 168 hours. No results for input(s): AMMONIA in the last 168 hours. CBC: Recent Labs  Lab  05/17/19 1414 05/18/19 0239 05/19/19  0230 05/20/19 0324 05/21/19 0228  WBC 17.5* 16.4* 16.7* 9.2 3.3*  NEUTROABS 14.0* 13.0* 13.8*  --   --   HGB 15.1* 11.0* 11.1* 10.3* 12.0  HCT 44.7 34.2* 35.2* 33.3* 37.9  MCV 94.1 96.9 98.6 98.2 97.4  PLT 285 203 176 161 214   Cardiac Enzymes: No results for input(s): CKTOTAL, CKMB, CKMBINDEX, TROPONINI in the last 168 hours. BNP: Invalid input(s): POCBNP CBG: Recent Labs  Lab 05/20/19 0731 05/20/19 1212 05/20/19 1705 05/20/19 2158 05/21/19 0820  GLUCAP 120* 116* 237* 262* 246*   D-Dimer No results for input(s): DDIMER in the last 72 hours. Hgb A1c No results for input(s): HGBA1C in the last 72 hours. Lipid Profile No results for input(s): CHOL, HDL, LDLCALC, TRIG, CHOLHDL, LDLDIRECT in the last 72 hours. Thyroid function studies No results for input(s): TSH, T4TOTAL, T3FREE, THYROIDAB in the last 72 hours.  Invalid input(s): FREET3 Anemia work up No results for input(s): VITAMINB12, FOLATE, FERRITIN, TIBC, IRON, RETICCTPCT in the last 72 hours. Urinalysis    Component Value Date/Time   COLORURINE YELLOW 05/17/2019 1414   APPEARANCEUR CLEAR 05/17/2019 1414   LABSPEC 1.010 05/17/2019 1414   PHURINE 7.0 05/17/2019 1414   GLUCOSEU NEGATIVE 05/17/2019 1414   HGBUR NEGATIVE 05/17/2019 1414   BILIRUBINUR NEGATIVE 05/17/2019 1414   BILIRUBINUR neg 07/31/2013 1033   KETONESUR NEGATIVE 05/17/2019 1414   PROTEINUR NEGATIVE 05/17/2019 1414   UROBILINOGEN 0.2 08/04/2013 1120   NITRITE NEGATIVE 05/17/2019 1414   LEUKOCYTESUR TRACE (A) 05/17/2019 1414   Sepsis Labs Invalid input(s): PROCALCITONIN,  WBC,  LACTICIDVEN   Time coordinating discharge: 35 minutes  SIGNED:  Almon Herculesaye T Armeda Plumb, MD  [facility] 05/21/2019, 9:39 AM  If 7PM-7AM, please contact night-coverage [email or web address] Password [email or web address]

## 2019-05-22 LAB — CULTURE, BLOOD (ROUTINE X 2)
Culture: NO GROWTH
Culture: NO GROWTH

## 2021-11-23 ENCOUNTER — Emergency Department (HOSPITAL_COMMUNITY): Payer: BC Managed Care – PPO

## 2021-11-23 ENCOUNTER — Emergency Department (HOSPITAL_COMMUNITY)
Admission: EM | Admit: 2021-11-23 | Discharge: 2021-11-23 | Disposition: A | Discharge status: Home / Self Care | Payer: BC Managed Care – PPO | Attending: Emergency Medicine | Admitting: Emergency Medicine

## 2021-11-23 ENCOUNTER — Other Ambulatory Visit: Payer: Self-pay

## 2021-11-23 ENCOUNTER — Encounter (HOSPITAL_COMMUNITY): Payer: Self-pay

## 2021-11-23 DIAGNOSIS — F17210 Nicotine dependence, cigarettes, uncomplicated: Secondary | ICD-10-CM | POA: Diagnosis not present

## 2021-11-23 DIAGNOSIS — J449 Chronic obstructive pulmonary disease, unspecified: Secondary | ICD-10-CM | POA: Insufficient documentation

## 2021-11-23 DIAGNOSIS — R0602 Shortness of breath: Secondary | ICD-10-CM | POA: Diagnosis present

## 2021-11-23 DIAGNOSIS — Z5329 Procedure and treatment not carried out because of patient's decision for other reasons: Secondary | ICD-10-CM | POA: Diagnosis not present

## 2021-11-23 DIAGNOSIS — Z7984 Long term (current) use of oral hypoglycemic drugs: Secondary | ICD-10-CM | POA: Insufficient documentation

## 2021-11-23 DIAGNOSIS — Z20822 Contact with and (suspected) exposure to covid-19: Secondary | ICD-10-CM | POA: Insufficient documentation

## 2021-11-23 DIAGNOSIS — Z79899 Other long term (current) drug therapy: Secondary | ICD-10-CM | POA: Insufficient documentation

## 2021-11-23 DIAGNOSIS — I10 Essential (primary) hypertension: Secondary | ICD-10-CM | POA: Diagnosis not present

## 2021-11-23 DIAGNOSIS — Z794 Long term (current) use of insulin: Secondary | ICD-10-CM | POA: Insufficient documentation

## 2021-11-23 DIAGNOSIS — E119 Type 2 diabetes mellitus without complications: Secondary | ICD-10-CM | POA: Insufficient documentation

## 2021-11-23 LAB — CBC WITH DIFFERENTIAL/PLATELET
Abs Immature Granulocytes: 0.19 10*3/uL — ABNORMAL HIGH (ref 0.00–0.07)
Basophils Absolute: 0 10*3/uL (ref 0.0–0.1)
Basophils Relative: 0 %
Eosinophils Absolute: 0 10*3/uL (ref 0.0–0.5)
Eosinophils Relative: 0 %
HCT: 42.1 % (ref 36.0–46.0)
Hemoglobin: 13.9 g/dL (ref 12.0–15.0)
Immature Granulocytes: 1 %
Lymphocytes Relative: 14 %
Lymphs Abs: 1.9 10*3/uL (ref 0.7–4.0)
MCH: 30.6 pg (ref 26.0–34.0)
MCHC: 33 g/dL (ref 30.0–36.0)
MCV: 92.7 fL (ref 80.0–100.0)
Monocytes Absolute: 0.3 10*3/uL (ref 0.1–1.0)
Monocytes Relative: 2 %
Neutro Abs: 11.5 10*3/uL — ABNORMAL HIGH (ref 1.7–7.7)
Neutrophils Relative %: 83 %
Platelets: 259 10*3/uL (ref 150–400)
RBC: 4.54 MIL/uL (ref 3.87–5.11)
RDW: 14.6 % (ref 11.5–15.5)
WBC: 14 10*3/uL — ABNORMAL HIGH (ref 4.0–10.5)
nRBC: 0 % (ref 0.0–0.2)

## 2021-11-23 LAB — BASIC METABOLIC PANEL
Anion gap: 9 (ref 5–15)
BUN: 12 mg/dL (ref 8–23)
CO2: 30 mmol/L (ref 22–32)
Calcium: 8.3 mg/dL — ABNORMAL LOW (ref 8.9–10.3)
Chloride: 96 mmol/L — ABNORMAL LOW (ref 98–111)
Creatinine, Ser: 0.46 mg/dL (ref 0.44–1.00)
GFR, Estimated: >60 mL/min (ref >60)
Glucose, Bld: 325 mg/dL — ABNORMAL HIGH (ref 70–99)
Potassium: 4.4 mmol/L (ref 3.5–5.1)
Sodium: 135 mmol/L (ref 135–145)

## 2021-11-23 LAB — RESP PANEL BY RT-PCR (FLU A&B, COVID) ARPGX2
Influenza A by PCR: NEGATIVE
Influenza B by PCR: NEGATIVE
SARS Coronavirus 2 by RT PCR: NEGATIVE

## 2021-11-23 LAB — CBG MONITORING, ED: Glucose-Capillary: 364 mg/dL — ABNORMAL HIGH (ref 70–99)

## 2021-11-23 MED ORDER — IPRATROPIUM-ALBUTEROL 0.5-2.5 (3) MG/3ML IN SOLN
3.0000 mL | Freq: Once | RESPIRATORY_TRACT | Status: AC
  Administered 2021-11-23: 3 mL via RESPIRATORY_TRACT
  Filled 2021-11-23: qty 3

## 2021-11-23 MED ORDER — ALBUTEROL SULFATE (2.5 MG/3ML) 0.083% IN NEBU: 2.5000 mg | INHALATION_SOLUTION | Freq: Four times a day (QID) | RESPIRATORY_TRACT | 0 refills | Status: DC | PRN

## 2021-11-23 MED ORDER — METHYLPREDNISOLONE SODIUM SUCC 125 MG IJ SOLR
125.0000 mg | Freq: Once | INTRAMUSCULAR | Status: AC
  Administered 2021-11-23: 125 mg via INTRAVENOUS
  Filled 2021-11-23: qty 2

## 2021-11-23 MED ORDER — UMECLIDINIUM BROMIDE 62.5 MCG/ACT IN AEPB: 1.0000 | INHALATION_SPRAY | Freq: Every day | RESPIRATORY_TRACT | 0 refills | Status: DC

## 2021-11-23 MED ORDER — MAGNESIUM SULFATE 2 GM/50ML IV SOLN
2.0000 g | Freq: Once | INTRAVENOUS | Status: AC
  Administered 2021-11-23: 2 g via INTRAVENOUS
  Filled 2021-11-23: qty 50

## 2021-11-23 NOTE — ED Provider Notes (Signed)
[facility]-EMERGENCY DEPT Provider Note   CSN: [id number] Arrival date & time: 11/23/21  1025     History  Chief Complaint  Patient presents with   Cough   Hyperglycemia    Patricia Kaiser is a 66 y.o. female.  HPI  66 year old female with past medical history of COPD, HTN, DM, everyday cigarette smoker presents emergency department with ongoing shortness of breath, wheezing and cough.  Patient states for the last 2 weeks has been treated as an outpatient with her primary doctor.  She has been on oral steroids, completed her second round of antibiotics without significant improvement.  She admits that she continues to smoke daily.  She is using her home inhalers as prescribed without any significant improvement.  Presents today with worsening shortness of breath.  Denies any chest pain, lower extremity swelling.  Her cough is nonproductive, denies any hemoptysis.  No recent fever.  No known sick contacts or traveling.  Home Medications Prior to Admission medications   Medication Sig Start Date End Date Taking? Authorizing Provider  acetaminophen (TYLENOL) 500 MG tablet Take 1,000 mg by mouth every 6 (six) hours as needed for pain.    [provider]  albuterol (PROVENTIL) (2.5 MG/3ML) 0.083% nebulizer solution USE 1 VIAL IN NEBULE EVERY 6 HOURS Patient taking differently: Take 2.5 mg by nebulization every 6 (six) hours as needed for wheezing or shortness of breath.  05/17/14   Ernestina Penna, MD  amoxicillin-clavulanate (AUGMENTIN) 875-125 MG tablet Take 1 tablet by mouth every 12 (twelve) hours. 05/21/19   Almon Hercules, MD  atorvastatin (LIPITOR) 20 MG tablet TAKE 1 TABLET (20 MG TOTAL) BY MOUTH DAILY. Patient taking differently: Take 20 mg by mouth at bedtime.     Martin, Mary-Margaret, FNP  BREO ELLIPTA 200-25 MCG/INH AEPB Inhale 1 puff into the lungs daily.  04/20/19   [provider]  glipiZIDE (GLUCOTROL) 5 MG tablet TAKE 1 TABLET (5 MG TOTAL)  BY MOUTH 2 (TWO) TIMES DAILY BEFORE A MEAL. Patient taking differently: Take 5 mg by mouth 2 (two) times daily before a meal.  06/17/14   Daphine Deutscher, Mary-Margaret, FNP  Insulin Glargine (LANTUS SOLOSTAR) 100 UNIT/ML Solostar Pen Inject 15-20 Units into the skin daily.    [provider]  lisinopril (ZESTRIL) 2.5 MG tablet Take 2.5 mg by mouth daily. 05/02/19   [provider]  omeprazole (PRILOSEC) 20 MG capsule Take 20 mg by mouth daily. 04/14/19   [provider]  OVER THE COUNTER MEDICATION Take 1 tablet by mouth daily. Hair, Skin and Nails    [provider]  PREMARIN 1.25 MG tablet TAKE 1 TABLET (1.25 MG TOTAL) BY MOUTH DAILY. Patient taking differently: Take 1.25 mg by mouth at bedtime.  09/20/14   Daphine Deutscher, Mary-Margaret, FNP  umeclidinium bromide (INCRUSE ELLIPTA) 62.5 MCG/INH AEPB Inhale 1 puff into the lungs daily.  05/05/18   [provider]      Allergies    Cefdinir, Sulfa antibiotics, Codeine, Erythromycin, Gabapentin, Metformin and related, and Symbicort [budesonide-formoterol fumarate]    Review of Systems   Review of Systems  Constitutional:  Negative for fever.  Respiratory:  Positive for cough, shortness of breath and wheezing. Negative for chest tightness.   Cardiovascular:  Negative for chest pain, palpitations and leg swelling.  Gastrointestinal:  Negative for abdominal pain, diarrhea and vomiting.  Skin:  Negative for rash.  Neurological:  Negative for headaches.   Physical Exam Updated Vital Signs BP 110/73  Pulse 74    Temp 98.5 F (36.9 C) (Oral)    Resp (!) 23    Ht 5\' 2"  (1.575 m)    Wt 49.9 kg    SpO2 92%    BMI 20.12 kg/m  Physical Exam Vitals and nursing note reviewed.  Constitutional:      General: She is not in acute distress.    Appearance: Normal appearance. She is not diaphoretic.  HENT:     Head: Normocephalic.     Mouth/Throat:     Mouth: Mucous membranes are moist.  Cardiovascular:     Rate and Rhythm:  Normal rate.  Pulmonary:     Breath sounds: Wheezing present.     Comments: At times tachypneic and breathless with conversation but no hypoxia on room air Abdominal:     Palpations: Abdomen is soft.     Tenderness: There is no abdominal tenderness.  Musculoskeletal:        General: No swelling.  Skin:    General: Skin is warm.  Neurological:     Mental Status: She is alert and oriented to person, place, and time. Mental status is at baseline.  Psychiatric:        Mood and Affect: Mood normal.    ED Results / Procedures / Treatments   Labs (all labs ordered are listed, but only abnormal results are displayed) Labs Reviewed  CBC WITH DIFFERENTIAL/PLATELET - Abnormal; Notable for the following components:      Result Value   WBC 14.0 (*)    Neutro Abs 11.5 (*)    Abs Immature Granulocytes 0.19 (*)    All other components within normal limits  CBG MONITORING, ED - Abnormal; Notable for the following components:   Glucose-Capillary 364 (*)    All other components within normal limits  RESP PANEL BY RT-PCR (FLU A&B, COVID) ARPGX2  BASIC METABOLIC PANEL    EKG EKG Interpretation  Date/Time:  Monday November 23 2021 11:45:24 EST Ventricular Rate:  81 PR Interval:  119 QRS Duration: 73 QT Interval:  352 QTC Calculation: 409 R Axis:   65 Text Interpretation: Sinus rhythm Borderline short PR interval Similar to previous Confirmed by 10-11-1968 [id number]) on 11/23/2021 11:58:00 AM  Radiology DG Chest 2 View  Result Date: 11/23/2021 CLINICAL DATA:  Nonproductive cough and shortness of breath for 2 weeks. EXAM: CHEST - 2 VIEW COMPARISON:  05/20/2019. FINDINGS: Trachea is midline. Heart size normal. Thoracic aorta is calcified. Lungs are hyperinflated but clear. No pleural fluid. IMPRESSION: Hyperinflation without acute finding. Electronically Signed   By: 07/20/2019 M.D.   On: 11/23/2021 11:35    Procedures Procedures    Medications Ordered in ED Medications  magnesium  sulfate IVPB 2 g 50 mL (2 g Intravenous New Bag/Given 11/23/21 1222)  ipratropium-albuterol (DUONEB) 0.5-2.5 (3) MG/3ML nebulizer solution 3 mL (3 mLs Nebulization Given 11/23/21 1224)  methylPREDNISolone sodium succinate (SOLU-MEDROL) 125 mg/2 mL injection 125 mg (125 mg Intravenous Given 11/23/21 1218)    ED Course/ Medical Decision Making/ A&P                           Medical Decision Making Amount and/or Complexity of Data Reviewed Labs: ordered. Radiology: ordered.  Risk Prescription drug management.   This patient presents to the ED for concern of shortness of breath/wheezing, this involves an extensive number of treatment options, and is a complaint that carries with it a high risk of complications and  morbidity.  The differential diagnosis includes COPD exacerbation, pneumonia, heart failure, respiratory failure   Additional history obtained: -Additional history obtained from sister-in-law at bedside -External records from outside source obtained and reviewed including: Chart review including previous notes, labs, imaging, consultation notes   Lab Tests: -I ordered, reviewed, and interpreted labs.  The pertinent results include: Baseline blood work, hyperglycemia   EKG -Sinus rhythm   Imaging Studies ordered: -I ordered imaging studies including chest x-ray -I independently visualized and interpreted imaging which showed no findings of pneumonia/heart failure -I agree with the radiologist interpretation   Medicines ordered and prescription drug management: -I ordered medication including breathing treatment, IV steroids, magnesium for symptoms -Reevaluation of the patient after these medicines showed that the patient stayed the same -I have reviewed the patients home medicines and have made adjustments as needed   ED Course: 66 year old female presents emergency department with worsening shortness of breath, wheezing.  She been treated outpatient with steroids and  antibiotic without any improvement.  On arrival she is tachypneic, diffuse wheezing bilaterally.  Blood work reveals hyperglycemia, most likely due to her chronic steroid therapy.  There is a mild leukocytosis also probably attributed to steroid use.  Chest x-ray shows no focal pneumonia, flu and COVID are negative.  No signs of heart failure.  Patient was given multiple different treatment modalities without any significant improvement.  Continues to be hypoxic even at rest especially with conversation and I recommended admission.  Patient declines and wishes to leave AGAINST MEDICAL ADVICE.  I have recommended that the patient stays in the department for the completion of their workup however they decline.  I have expressed the importance of staying including the risks of leaving which include worsening condition, permanent disability and death.  Patient accepts these risks.  They are alert and oriented, they have capacity to make this decision.  I have not been able to convince the patient to stay and they understand the risks of leaving AGAINST MEDICAL ADVICE.     Critical Interventions: IV medications and breathing treatment   Cardiac Monitoring: The patient was maintained on a cardiac monitor.  I personally viewed and interpreted the cardiac monitored which showed an underlying rhythm of: Sinus rhythm   Reevaluation: After the interventions noted above, I reevaluated the patient and found that they have :stayed the same   Dispostion: AMA        Final Clinical Impression(s) / ED Diagnoses Final diagnoses:  None    Rx / DC Orders ED Discharge Orders     None         Rozelle Logan, DO 11/23/21 1534

## 2021-11-23 NOTE — Discharge Instructions (Addendum)
You have been seen in the emergency room today for shortness of breath.  You had mild improvement with our treatments.  However with your ongoing symptoms and low oxygen level I have recommended admission.  You have declined.  I have refilled your home medications.  You have chosen to leave the emergency department AGAINST MEDICAL ADVICE.  I have explained to you your testing results and the need for further evaluation/admission.  You have verbalized understanding of these results and plan and are choosing to leave the hospital AGAINST MEDICAL ADVICE. You understand the risks associated with leaving without further evaluation/treatment. If you have any worsening symptoms or choose to be treated please return immediately to emergency department.

## 2021-11-23 NOTE — ED Triage Notes (Signed)
Patient c/o a non productive cough and SOB x 2 weeks. Patient has been on 2 rounds of steroids and antibiotics.  Patient states her blood sugars 319 at 0300 and 398 at 0800 today.

## 2021-12-10 ENCOUNTER — Emergency Department (HOSPITAL_COMMUNITY): Payer: BC Managed Care – PPO

## 2021-12-10 ENCOUNTER — Encounter (HOSPITAL_COMMUNITY): Payer: Self-pay | Admitting: Emergency Medicine

## 2021-12-10 ENCOUNTER — Other Ambulatory Visit: Payer: Self-pay

## 2021-12-10 ENCOUNTER — Observation Stay (HOSPITAL_COMMUNITY)
Admission: EM | Admit: 2021-12-10 | Discharge: 2021-12-11 | Disposition: A | Discharge status: Home / Self Care | Payer: BC Managed Care – PPO | Attending: Internal Medicine | Admitting: Internal Medicine

## 2021-12-10 DIAGNOSIS — J441 Chronic obstructive pulmonary disease with (acute) exacerbation: Secondary | ICD-10-CM | POA: Diagnosis not present

## 2021-12-10 DIAGNOSIS — F17210 Nicotine dependence, cigarettes, uncomplicated: Secondary | ICD-10-CM | POA: Diagnosis not present

## 2021-12-10 DIAGNOSIS — J9621 Acute and chronic respiratory failure with hypoxia: Secondary | ICD-10-CM | POA: Diagnosis present

## 2021-12-10 DIAGNOSIS — Z794 Long term (current) use of insulin: Secondary | ICD-10-CM | POA: Diagnosis not present

## 2021-12-10 DIAGNOSIS — Z79899 Other long term (current) drug therapy: Secondary | ICD-10-CM | POA: Diagnosis not present

## 2021-12-10 DIAGNOSIS — J9601 Acute respiratory failure with hypoxia: Secondary | ICD-10-CM | POA: Diagnosis not present

## 2021-12-10 DIAGNOSIS — E119 Type 2 diabetes mellitus without complications: Secondary | ICD-10-CM | POA: Insufficient documentation

## 2021-12-10 DIAGNOSIS — R0602 Shortness of breath: Secondary | ICD-10-CM | POA: Diagnosis present

## 2021-12-10 DIAGNOSIS — I10 Essential (primary) hypertension: Secondary | ICD-10-CM | POA: Diagnosis not present

## 2021-12-10 DIAGNOSIS — R0902 Hypoxemia: Secondary | ICD-10-CM

## 2021-12-10 DIAGNOSIS — E1165 Type 2 diabetes mellitus with hyperglycemia: Secondary | ICD-10-CM

## 2021-12-10 DIAGNOSIS — Z20822 Contact with and (suspected) exposure to covid-19: Secondary | ICD-10-CM | POA: Insufficient documentation

## 2021-12-10 DIAGNOSIS — E785 Hyperlipidemia, unspecified: Secondary | ICD-10-CM | POA: Diagnosis present

## 2021-12-10 DIAGNOSIS — Z720 Tobacco use: Secondary | ICD-10-CM

## 2021-12-10 DIAGNOSIS — R739 Hyperglycemia, unspecified: Secondary | ICD-10-CM

## 2021-12-10 LAB — CBC WITH DIFFERENTIAL/PLATELET
Abs Immature Granulocytes: 0.08 10*3/uL — ABNORMAL HIGH (ref 0.00–0.07)
Basophils Absolute: 0.1 10*3/uL (ref 0.0–0.1)
Basophils Relative: 1 %
Eosinophils Absolute: 0.2 10*3/uL (ref 0.0–0.5)
Eosinophils Relative: 2 %
HCT: 45.1 % (ref 36.0–46.0)
Hemoglobin: 14.9 g/dL (ref 12.0–15.0)
Immature Granulocytes: 1 %
Lymphocytes Relative: 19 %
Lymphs Abs: 2.2 10*3/uL (ref 0.7–4.0)
MCH: 31.4 pg (ref 26.0–34.0)
MCHC: 33 g/dL (ref 30.0–36.0)
MCV: 94.9 fL (ref 80.0–100.0)
Monocytes Absolute: 0.6 10*3/uL (ref 0.1–1.0)
Monocytes Relative: 6 %
Neutro Abs: 8.1 10*3/uL — ABNORMAL HIGH (ref 1.7–7.7)
Neutrophils Relative %: 71 %
Platelets: 296 10*3/uL (ref 150–400)
RBC: 4.75 MIL/uL (ref 3.87–5.11)
RDW: 13.8 % (ref 11.5–15.5)
WBC: 11.2 10*3/uL — ABNORMAL HIGH (ref 4.0–10.5)
nRBC: 0 % (ref 0.0–0.2)

## 2021-12-10 LAB — COMPREHENSIVE METABOLIC PANEL
ALT: 15 U/L (ref 0–44)
AST: 15 U/L (ref 15–41)
Albumin: 3.6 g/dL (ref 3.5–5.0)
Alkaline Phosphatase: 84 U/L (ref 38–126)
Anion gap: 8 (ref 5–15)
BUN: 13 mg/dL (ref 8–23)
CO2: 28 mmol/L (ref 22–32)
Calcium: 9.1 mg/dL (ref 8.9–10.3)
Chloride: 98 mmol/L (ref 98–111)
Creatinine, Ser: 0.63 mg/dL (ref 0.44–1.00)
GFR, Estimated: >60 mL/min (ref >60)
Glucose, Bld: 435 mg/dL — ABNORMAL HIGH (ref 70–99)
Potassium: 4.5 mmol/L (ref 3.5–5.1)
Sodium: 134 mmol/L — ABNORMAL LOW (ref 135–145)
Total Bilirubin: 0.1 mg/dL — ABNORMAL LOW (ref 0.3–1.2)
Total Protein: 7.1 g/dL (ref 6.5–8.1)

## 2021-12-10 LAB — RESP PANEL BY RT-PCR (FLU A&B, COVID) ARPGX2
Influenza A by PCR: NEGATIVE
Influenza B by PCR: NEGATIVE
SARS Coronavirus 2 by RT PCR: NEGATIVE

## 2021-12-10 LAB — CBG MONITORING, ED
Glucose-Capillary: 287 mg/dL — ABNORMAL HIGH (ref 70–99)
Glucose-Capillary: 308 mg/dL — ABNORMAL HIGH (ref 70–99)

## 2021-12-10 LAB — GLUCOSE, CAPILLARY: Glucose-Capillary: 267 mg/dL — ABNORMAL HIGH (ref 70–99)

## 2021-12-10 LAB — D-DIMER, QUANTITATIVE: D-Dimer, Quant: <0.27 ug/mL-FEU (ref 0.00–0.50)

## 2021-12-10 LAB — BRAIN NATRIURETIC PEPTIDE: B Natriuretic Peptide: 29.6 pg/mL (ref 0.0–100.0)

## 2021-12-10 MED ORDER — FLUTICASONE FUROATE-VILANTEROL 200-25 MCG/ACT IN AEPB
1.0000 | INHALATION_SPRAY | Freq: Every day | RESPIRATORY_TRACT | Status: DC
  Administered 2021-12-11: 1 via RESPIRATORY_TRACT
  Filled 2021-12-10: qty 28

## 2021-12-10 MED ORDER — ACETAMINOPHEN 650 MG RE SUPP: 650.0000 mg | Freq: Four times a day (QID) | RECTAL | Status: DC | PRN

## 2021-12-10 MED ORDER — ONDANSETRON HCL 4 MG/2ML IJ SOLN: 4.0000 mg | Freq: Four times a day (QID) | INTRAMUSCULAR | Status: DC | PRN

## 2021-12-10 MED ORDER — METHYLPREDNISOLONE SODIUM SUCC 40 MG IJ SOLR
40.0000 mg | INTRAMUSCULAR | Status: DC
  Administered 2021-12-11: 40 mg via INTRAVENOUS
  Filled 2021-12-10: qty 1

## 2021-12-10 MED ORDER — UMECLIDINIUM BROMIDE 62.5 MCG/ACT IN AEPB
1.0000 | INHALATION_SPRAY | Freq: Every day | RESPIRATORY_TRACT | Status: DC
  Administered 2021-12-11: 1 via RESPIRATORY_TRACT
  Filled 2021-12-10: qty 7

## 2021-12-10 MED ORDER — DOXYCYCLINE HYCLATE 100 MG PO TABS
100.0000 mg | ORAL_TABLET | Freq: Two times a day (BID) | ORAL | Status: DC
  Administered 2021-12-10 – 2021-12-11 (×3): 100 mg via ORAL
  Filled 2021-12-10 (×3): qty 1

## 2021-12-10 MED ORDER — PANTOPRAZOLE SODIUM 40 MG PO TBEC
40.0000 mg | DELAYED_RELEASE_TABLET | Freq: Every day | ORAL | Status: DC
Start: 2021-12-10 — End: 2021-12-11
  Administered 2021-12-10 – 2021-12-11 (×2): 40 mg via ORAL
  Filled 2021-12-10 (×2): qty 1

## 2021-12-10 MED ORDER — MAGNESIUM SULFATE 2 GM/50ML IV SOLN
2.0000 g | Freq: Once | INTRAVENOUS | Status: AC
  Administered 2021-12-10: 2 g via INTRAVENOUS
  Filled 2021-12-10: qty 50

## 2021-12-10 MED ORDER — ALBUTEROL SULFATE (2.5 MG/3ML) 0.083% IN NEBU: 2.5000 mg | INHALATION_SOLUTION | RESPIRATORY_TRACT | Status: DC | PRN

## 2021-12-10 MED ORDER — METHYLPREDNISOLONE SODIUM SUCC 125 MG IJ SOLR
125.0000 mg | Freq: Once | INTRAMUSCULAR | Status: AC
  Administered 2021-12-10: 125 mg via INTRAVENOUS
  Filled 2021-12-10: qty 2

## 2021-12-10 MED ORDER — ALBUTEROL SULFATE (2.5 MG/3ML) 0.083% IN NEBU
2.5000 mg | INHALATION_SOLUTION | Freq: Once | RESPIRATORY_TRACT | Status: AC
  Administered 2021-12-10: 2.5 mg via RESPIRATORY_TRACT
  Filled 2021-12-10: qty 3

## 2021-12-10 MED ORDER — GUAIFENESIN-DM 100-10 MG/5ML PO SYRP
5.0000 mL | ORAL_SOLUTION | ORAL | Status: DC | PRN
  Administered 2021-12-10: 5 mL via ORAL
  Filled 2021-12-10: qty 10

## 2021-12-10 MED ORDER — ATORVASTATIN CALCIUM 10 MG PO TABS
20.0000 mg | ORAL_TABLET | Freq: Every day | ORAL | Status: DC
  Administered 2021-12-10: 20 mg via ORAL
  Filled 2021-12-10: qty 2

## 2021-12-10 MED ORDER — ACETAMINOPHEN 325 MG PO TABS
650.0000 mg | ORAL_TABLET | Freq: Four times a day (QID) | ORAL | Status: DC | PRN
Start: 2021-12-10 — End: 2021-12-11
  Administered 2021-12-10 – 2021-12-11 (×2): 650 mg via ORAL
  Filled 2021-12-10 (×2): qty 2

## 2021-12-10 MED ORDER — ENOXAPARIN SODIUM 40 MG/0.4ML IJ SOSY
40.0000 mg | PREFILLED_SYRINGE | INTRAMUSCULAR | Status: DC
  Administered 2021-12-10: 40 mg via SUBCUTANEOUS
  Filled 2021-12-10: qty 0.4

## 2021-12-10 MED ORDER — INSULIN GLARGINE-YFGN 100 UNIT/ML ~~LOC~~ SOLN
20.0000 [IU] | Freq: Two times a day (BID) | SUBCUTANEOUS | Status: DC
  Administered 2021-12-10: 20 [IU] via SUBCUTANEOUS
  Filled 2021-12-10 (×2): qty 0.2

## 2021-12-10 MED ORDER — ALBUTEROL SULFATE (2.5 MG/3ML) 0.083% IN NEBU: 10.0000 mg/h | INHALATION_SOLUTION | Freq: Once | RESPIRATORY_TRACT | Status: DC

## 2021-12-10 MED ORDER — GLIPIZIDE 5 MG PO TABS: 5.0000 mg | ORAL_TABLET | Freq: Two times a day (BID) | ORAL | Status: DC

## 2021-12-10 MED ORDER — INSULIN ASPART 100 UNIT/ML IJ SOLN
0.0000 [IU] | INTRAMUSCULAR | Status: DC
  Administered 2021-12-10: 8 [IU] via SUBCUTANEOUS
  Administered 2021-12-10: 11 [IU] via SUBCUTANEOUS
  Administered 2021-12-11 (×2): 5 [IU] via SUBCUTANEOUS
  Administered 2021-12-11: 15 [IU] via SUBCUTANEOUS
  Filled 2021-12-10: qty 0.15

## 2021-12-10 MED ORDER — SENNOSIDES-DOCUSATE SODIUM 8.6-50 MG PO TABS: 1.0000 | ORAL_TABLET | Freq: Every evening | ORAL | Status: DC | PRN

## 2021-12-10 MED ORDER — LISINOPRIL 2.5 MG PO TABS
2.5000 mg | ORAL_TABLET | Freq: Every day | ORAL | Status: DC
  Filled 2021-12-10: qty 1

## 2021-12-10 MED ORDER — GLIPIZIDE 5 MG PO TABS
5.0000 mg | ORAL_TABLET | Freq: Every day | ORAL | Status: DC
  Administered 2021-12-11: 5 mg via ORAL
  Filled 2021-12-10: qty 1

## 2021-12-10 MED ORDER — IPRATROPIUM-ALBUTEROL 0.5-2.5 (3) MG/3ML IN SOLN
3.0000 mL | Freq: Four times a day (QID) | RESPIRATORY_TRACT | Status: DC
  Administered 2021-12-10 – 2021-12-11 (×5): 3 mL via RESPIRATORY_TRACT
  Filled 2021-12-10 (×5): qty 3

## 2021-12-10 MED ORDER — MELATONIN 5 MG PO TABS
5.0000 mg | ORAL_TABLET | Freq: Once | ORAL | Status: AC
  Administered 2021-12-10: 5 mg via ORAL
  Filled 2021-12-10: qty 1

## 2021-12-10 MED ORDER — ESTROGENS CONJUGATED 0.625 MG PO TABS
1.2500 mg | ORAL_TABLET | Freq: Every day | ORAL | Status: DC
  Administered 2021-12-10: 1.25 mg via ORAL
  Filled 2021-12-10: qty 2
  Filled 2021-12-10: qty 1

## 2021-12-10 MED ORDER — INSULIN ASPART 100 UNIT/ML IJ SOLN
5.0000 [IU] | Freq: Three times a day (TID) | INTRAMUSCULAR | Status: DC
  Filled 2021-12-10: qty 0.05

## 2021-12-10 MED ORDER — ONDANSETRON HCL 4 MG PO TABS: 4.0000 mg | ORAL_TABLET | Freq: Four times a day (QID) | ORAL | Status: DC | PRN

## 2021-12-10 NOTE — Assessment & Plan Note (Addendum)
-   Continue home medication 

## 2021-12-10 NOTE — ED Provider Notes (Signed)
[facility]-EMERGENCY DEPT Provider Note   CSN: [id number] Arrival date & time: 12/10/21  1129     History  Chief Complaint  Patient presents with   Shortness of Breath    Patricia Kaiser is a 66 y.o. female.  HPI She presents for evaluation of ongoing cough and congestion with sputum production, for at least a month, status post treatment several times with antibiotics and steroids.  She saw her pulmonologist, last week for a new visit, and her chronic inhaler was changed.  She uses albuterol, rescue inhaler, periodically.  She denies fever.  She states her sputum has changed from gray color to yellow color.  She denies chest pain.  She checked her oxygen saturation at home last night and it was "70%."  She does not use oxygen, chronically.  On arrival to the ED today, her oxygen saturations 85%.    Home Medications Prior to Admission medications   Medication Sig Start Date End Date Taking? Authorizing Provider  acetaminophen (TYLENOL) 500 MG tablet Take 1,000 mg by mouth every 6 (six) hours as needed for pain.    [provider]  albuterol (PROVENTIL) (2.5 MG/3ML) 0.083% nebulizer solution Take 3 mLs (2.5 mg total) by nebulization every 6 (six) hours as needed for wheezing or shortness of breath. 11/23/21   Horton, Clabe Seal, DO  amoxicillin-clavulanate (AUGMENTIN) 875-125 MG tablet Take 1 tablet by mouth every 12 (twelve) hours. 05/21/19   Almon Hercules, MD  atorvastatin (LIPITOR) 20 MG tablet TAKE 1 TABLET (20 MG TOTAL) BY MOUTH DAILY. Patient taking differently: Take 20 mg by mouth at bedtime.     Martin, Mary-Margaret, FNP  BREO ELLIPTA 200-25 MCG/INH AEPB Inhale 1 puff into the lungs daily.  04/20/19   [provider]  glipiZIDE (GLUCOTROL) 5 MG tablet TAKE 1 TABLET (5 MG TOTAL) BY MOUTH 2 (TWO) TIMES DAILY BEFORE A MEAL. Patient taking differently: Take 5 mg by mouth 2 (two) times daily before a meal.  06/17/14   Daphine Deutscher, Mary-Margaret, FNP   Insulin Glargine (LANTUS SOLOSTAR) 100 UNIT/ML Solostar Pen Inject 15-20 Units into the skin daily.    [provider]  lisinopril (ZESTRIL) 2.5 MG tablet Take 2.5 mg by mouth daily. 05/02/19   [provider]  omeprazole (PRILOSEC) 20 MG capsule Take 20 mg by mouth daily. 04/14/19   [provider]  OVER THE COUNTER MEDICATION Take 1 tablet by mouth daily. Hair, Skin and Nails    [provider]  PREMARIN 1.25 MG tablet TAKE 1 TABLET (1.25 MG TOTAL) BY MOUTH DAILY. Patient taking differently: Take 1.25 mg by mouth at bedtime.  09/20/14   Daphine Deutscher, Mary-Margaret, FNP  umeclidinium bromide (INCRUSE ELLIPTA) 62.5 MCG/ACT AEPB Inhale 1 puff into the lungs daily. 11/23/21   Horton, Clabe Seal, DO      Allergies    Cefdinir, Sulfa antibiotics, Codeine, Erythromycin, Gabapentin, Metformin and related, and Symbicort [budesonide-formoterol fumarate]    Review of Systems   Review of Systems  Physical Exam Updated Vital Signs BP (!) 117/49 Comment: RN made aware.   Pulse 70    Temp 97.9 F (36.6 C) (Oral)    Resp 20    Ht 5\' 2"  (1.575 m)    Wt 49.9 kg    SpO2 95%    BMI 20.12 kg/m  Physical Exam Vitals and nursing note reviewed.  Constitutional:      General: She is not in acute distress.    Appearance: She is well-developed. She  is not ill-appearing or diaphoretic.  HENT:     Head: Normocephalic and atraumatic.     Right Ear: External ear normal.     Left Ear: External ear normal.  Eyes:     Conjunctiva/sclera: Conjunctivae normal.     Pupils: Pupils are equal, round, and reactive to light.  Neck:     Trachea: Phonation normal.  Cardiovascular:     Rate and Rhythm: Normal rate and regular rhythm.     Heart sounds: Normal heart sounds.  Pulmonary:     Effort: Pulmonary effort is normal.     Comments: Tachypnea is present.  Decreased air movement bilaterally, without generalized wheezing or rhonchi.  Mildly increased work of breathing, forced expirations  noted. Abdominal:     Palpations: Abdomen is soft.     Tenderness: There is no abdominal tenderness.  Musculoskeletal:        General: No swelling or tenderness. Normal range of motion.     Cervical back: Normal range of motion and neck supple.     Right lower leg: No edema.     Left lower leg: No edema.  Skin:    General: Skin is warm and dry.  Neurological:     Mental Status: She is alert and oriented to person, place, and time.     Cranial Nerves: No cranial nerve deficit.     Sensory: No sensory deficit.     Motor: No abnormal muscle tone.     Coordination: Coordination normal.  Psychiatric:        Mood and Affect: Mood normal.        Behavior: Behavior normal.        Thought Content: Thought content normal.        Judgment: Judgment normal.    ED Results / Procedures / Treatments   Labs (all labs ordered are listed, but only abnormal results are displayed) Labs Reviewed  COMPREHENSIVE METABOLIC PANEL - Abnormal; Notable for the following components:      Result Value   Sodium 134 (*)    Glucose, Bld 435 (*)    Total Bilirubin 0.1 (*)    All other components within normal limits  CBC WITH DIFFERENTIAL/PLATELET - Abnormal; Notable for the following components:   WBC 11.2 (*)    Neutro Abs 8.1 (*)    Abs Immature Granulocytes 0.08 (*)    All other components within normal limits  RESP PANEL BY RT-PCR (FLU A&B, COVID) ARPGX2  D-DIMER, QUANTITATIVE  BRAIN NATRIURETIC PEPTIDE    EKG None  Radiology DG Chest 2 View  Result Date: 12/10/2021 CLINICAL DATA:  SHOB EXAM: CHEST - 2 VIEW COMPARISON:  November 23, 2021. FINDINGS: Chronic hyperinflation. No consolidation. No visible pleural effusions or pneumothorax. Cardiomediastinal silhouette is within normal limits and similar to prior. IMPRESSION: Similar hyperinflation without evidence of acute cardiopulmonary disease. Electronically Signed   By: Feliberto Harts M.D.   On: 12/10/2021 12:42    Procedures Procedures     Medications Ordered in ED Medications  methylPREDNISolone sodium succinate (SOLU-MEDROL) 125 mg/2 mL injection 125 mg (has no administration in time range)  magnesium sulfate IVPB 2 g 50 mL (0 g Intravenous Stopped 12/10/21 1431)  albuterol (PROVENTIL) (2.5 MG/3ML) 0.083% nebulizer solution 2.5 mg (2.5 mg Nebulization Given 12/10/21 1317)    ED Course/ Medical Decision Making/ A&P Clinical Course as of 12/10/21 1432  Thu Dec 10, 2021  1428 She is now lying comfortably, left lateral decubitus position, no respiratory distress.  Lungs  with somewhat improved air movement with a few audible wheezes at this time.  Work of breathing, has improved. [EW]    Clinical Course User Index [EW] Mancel Bale, MD                           Medical Decision Making Patient presenting for ongoing symptoms for 1 month, with hypoxia, 85% on room air on arrival.  She is reluctant to have more steroids because her blood sugar goes up when she takes it.  She has a history of diabetes, on insulin.  She recently saw pulmonary.  She continues to smoke.  Problems Addressed: COPD exacerbation (HCC): chronic illness or injury    Details: Worsening and persistent symptoms.  Hypoxia noted at home. Hypoxia: acute illness or injury  Amount and/or Complexity of Data Reviewed Independent Historian:     Details: She is a cogent historian External Data Reviewed: notes.    Details: Pulmonary evaluation 11/30/2021.  Medication change at that time noted. Labs: ordered.    Details: CBC, metabolic panel, viral panel Radiology: ordered and independent interpretation performed.    Details: Chest x-ray-no infiltrate or edema. ECG/medicine tests: ordered and independent interpretation performed.    Details: Cardiac monitor-normal sinus rhythm Discussion of management or test interpretation with external provider(s): Hospitalist consultation for admission  Risk Prescription drug management. Decision regarding  hospitalization. Risk Details: Patient with hypoxia on arrival, does not use oxygen at home has had persistent symptoms for 1 month despite multiple interventions.  She continues to smoke cigarettes.  She has exacerbation of COPD with respiratory failure and hypoxia, requiring hospitalization.  She has not required advanced airway.  She is stable on low dose nasal cannula oxygen.  Hopefully she can be stabilized to discharge in a couple days likely will need oxygen.  Smoking cessation is indicated.  Critical Care Total time providing critical care: 30-74 minutes          Final Clinical Impression(s) / ED Diagnoses Final diagnoses:  COPD exacerbation (HCC)  Hypoxia  Tobacco abuse    Rx / DC Orders ED Discharge Orders     None         Mancel Bale, MD 12/10/21 1439

## 2021-12-10 NOTE — Assessment & Plan Note (Addendum)
Acute COPD exacerbation: At this time clinically improved however she will need to go home with oxygen.  Continue prednisone doxycycline short course, patient prefers to be on only 10 mg prednisone, she will resume home inhalers follow-up with her pulmonary doctor, antitussives will be continued.

## 2021-12-10 NOTE — Plan of Care (Signed)
  Problem: Education: Goal: Knowledge of General Education information will improve Description: Including pain rating scale, medication(s)/side effects and non-pharmacologic comfort measures Outcome: Progressing   Problem: Safety: Goal: Ability to remain free from injury will improve Outcome: Progressing   Problem: Skin Integrity: Goal: Risk for impaired skin integrity will decrease Outcome: Progressing   Problem: Activity: Goal: Risk for activity intolerance will decrease Outcome: Not Progressing   

## 2021-12-10 NOTE — ED Notes (Signed)
Dinner tray given

## 2021-12-10 NOTE — Assessment & Plan Note (Addendum)
Continue statin. 

## 2021-12-10 NOTE — ED Notes (Signed)
Patient transported to X-ray 

## 2021-12-10 NOTE — H&P (Signed)
History and Physical    Patricia Kaiser JSR:[id number] DOB: November 29, 1955 DOA: 12/10/2021  PCP: Bridget Hartshorn, NP   Patient coming from:   Chief Complaint  Patient presents with   Shortness of Breath      HPI: 66 year old female with history of COPD, ongoing smoking, diabetes mellitus on insulin, hypertension, hyperlipidemia presented to the ED with complaint of ongoing shortness of breath congestion cough LHF sputum production for at least a month with recent worsening.  She has tried several times with antibiotics and steroids without much improvement.  She saw her pulmonologist last week for a new visit and her inhalers were changed.  She complains of exertional dyspnea, no chest pain nausea vomiting focal weakness fever chills.  In the ED routine labs stable negative D-dimer normal BNP , lab with uncontrolled hyperglycemia and 400 , was hypoxic saturating 85% on room air on arrival last night was 70% on room air at home.  Chest x-ray no acute finding respiratory virus panel Pending.  Patient is placed on supplemental oxygen given bronchodilators IV steroids and admission was requested for further management   Assessment and Plan: * Acute respiratory failure with hypoxemia ([place])- (present on admission) Acute COPD exacerbation: Patient will be admitted we will keep her on systemic steroids but will need to monitor blood sugar closely mild wheezing with deep breath, diminished breath sounds, given her sputum production add doxycycline to cover for bronchitis.  Resume inhalers, bronchodilators, placed on supplemental oxygen.  Will likely need home oxygen.  Uncontrolled type 2 diabetes mellitus with hyperglycemia, with long-term current use of insulin ([place]) With uncontrolled hyperglycemia likely in the setting of steroid use.  We will intensify her Levemir to twice daily dosing, add sliding scale insulin, resume glipizide, monitor and adjust.    Hyperlipemia- (present on  admission) Resume statin  COPD exacerbation ([place])- (present on admission) See above  Hypertension- (present on admission) Resume home medication   Body mass index is 20.12 kg/m.   Severity of Illness: The appropriate patient status for this patient is OBSERVATION. Observation status is judged to be reasonable and necessary in order to provide the required intensity of service to ensure the patient's safety. The patient's presenting symptoms, physical exam findings, and initial radiographic and laboratory data in the context of their medical condition is felt to place them at decreased risk for further clinical deterioration. Furthermore, it is anticipated that the patient will be medically stable for discharge from the hospital within 2 midnights of admission.    DVT prophylaxis: enoxaparin (LOVENOX) injection 40 mg Start: 12/10/21 1500 Code Status:   Code Status: Full Code  Family Communication: Admission, patients condition and plan of care including tests being ordered have been discussed with the patient who indicate understanding and agree with the plan and Code Status.  Consults called:    Review of Systems: All systems were reviewed and were negative except as mentioned in HPI above. Negative for fever Negative for chest pain Negative for shortness of breath  Past Medical History:  Diagnosis Date   Chest pain    2D ECHO, 08/25/2010 - EF->55%, normal   COPD (chronic obstructive pulmonary disease) (HCC)    DOE (dyspnea on exertion)    NUC, 08/25/2010 - low risk scan, normal   Hypertension    RENAL DOPPLER, 10/01/2010 - 1-59% diameter reduction of the R. renal artery   Non-insulin dependent type 2 diabetes mellitus (HCC)    Shortness of breath    MET TEST, 06/26/2012  Tobacco abuse    Ureteral stent retained     Past Surgical History:  Procedure Laterality Date   CESAREAN SECTION  1980/1984   PARTIAL HYSTERECTOMY  1987   TOTAL ABDOMINAL HYSTERECTOMY  1989   with  Ureter implant/redo implant in 1990     reports that she has been smoking cigarettes. She has a 7.50 pack-year smoking history. She has never used smokeless tobacco. She reports current alcohol use. She reports that she does not use drugs.  Allergies  Allergen Reactions   Cefdinir Hives, Itching and Rash    blister   Sulfa Antibiotics Anaphylaxis and Shortness Of Breath   Codeine Nausea Only   Erythromycin Hives   Gabapentin Nausea And Vomiting    GI upset, pt claims it made her "drunk"   Metformin And Related Nausea And Vomiting   Symbicort [Budesonide-Formoterol Fumarate]     Breaks mouth out    Family History  Problem Relation Age of Onset   COPD Mother    Lung cancer Mother    Arrhythmia Mother        Atrial Fibrillation   COPD Father    Emphysema Father    Heart failure Father    Diabetes Sister    Hyperlipidemia Sister    Hyperlipidemia Brother    Heart attack Maternal Grandfather    Stroke Paternal Grandfather    Diabetes Sister    Heart murmur Daughter      Prior to Admission medications   Medication Sig Start Date End Date Taking? Authorizing Provider  acetaminophen (TYLENOL) 500 MG tablet Take 1,000 mg by mouth every 6 (six) hours as needed for pain.    [provider]  albuterol (PROVENTIL) (2.5 MG/3ML) 0.083% nebulizer solution Take 3 mLs (2.5 mg total) by nebulization every 6 (six) hours as needed for wheezing or shortness of breath. 11/23/21   Horton, Alvin Critchley, DO  amoxicillin-clavulanate (AUGMENTIN) 875-125 MG tablet Take 1 tablet by mouth every 12 (twelve) hours. 05/21/19   Mercy Riding, MD  atorvastatin (LIPITOR) 20 MG tablet TAKE 1 TABLET (20 MG TOTAL) BY MOUTH DAILY. Patient taking differently: Take 20 mg by mouth at bedtime.     Martin, Mary-Margaret, FNP  BREO ELLIPTA 200-25 MCG/INH AEPB Inhale 1 puff into the lungs daily.  04/20/19   [provider]  glipiZIDE (GLUCOTROL) 5 MG tablet TAKE 1 TABLET (5 MG TOTAL) BY MOUTH 2 (TWO) TIMES  DAILY BEFORE A MEAL. Patient taking differently: Take 5 mg by mouth 2 (two) times daily before a meal.  06/17/14   Hassell Done, Mary-Margaret, FNP  Insulin Glargine (LANTUS SOLOSTAR) 100 UNIT/ML Solostar Pen Inject 15-20 Units into the skin daily.    [provider]  lisinopril (ZESTRIL) 2.5 MG tablet Take 2.5 mg by mouth daily. 05/02/19   [provider]  omeprazole (PRILOSEC) 20 MG capsule Take 20 mg by mouth daily. 04/14/19   [provider]  OVER THE COUNTER MEDICATION Take 1 tablet by mouth daily. Hair, Skin and Nails    [provider]  PREMARIN 1.25 MG tablet TAKE 1 TABLET (1.25 MG TOTAL) BY MOUTH DAILY. Patient taking differently: Take 1.25 mg by mouth at bedtime.  09/20/14   Hassell Done, Mary-Margaret, FNP  umeclidinium bromide (INCRUSE ELLIPTA) 62.5 MCG/ACT AEPB Inhale 1 puff into the lungs daily. 11/23/21   Lorelle Gibbs, DO    Physical Exam: Vitals:   12/10/21 1315 12/10/21 1345 12/10/21 1405 12/10/21 1430  BP: (!) 98/59 (!) 106/45 (!) 117/49 (!) 105/51  Pulse: 67 71 70 72  Resp: 20 (!) 23 20 (!) 22  Temp:      TempSrc:      SpO2: 95% 93% 95% 97%  Weight:      Height:        General exam: AAO x3, thin frail ill-looking, NAD, weak appearing. HEENT:Oral mucosa moist, Ear/Nose WNL grossly, dentition normal. Respiratory system: bilaterally diminished breath sounds, has wheezing on deep breath and cough Cardiovascular system: S1 & S2 +, No JVD,. Gastrointestinal system: Abdomen soft, NT,ND, BS+ Nervous System:Alert, awake, moving extremities and grossly nonfocal Extremities: No edema, distal peripheral pulses palpable.  Skin: No rashes,no icterus. MSK: Normal muscle bulk,tone, power  Labs on Admission: I have personally reviewed following labs and imaging studies  CBC: Recent Labs  Lab 12/10/21 1150  WBC 11.2*  NEUTROABS 8.1*  HGB 14.9  HCT 45.1  MCV 94.9  PLT 546   Basic Metabolic Panel: Recent Labs  Lab 12/10/21 1150  NA 134*  K 4.5   CL 98  CO2 28  GLUCOSE 435*  BUN 13  CREATININE 0.63  CALCIUM 9.1   GFR: Estimated Creatinine Clearance: 56 mL/min (by C-G formula based on SCr of 0.63 mg/dL). Liver Function Tests: Recent Labs  Lab 12/10/21 1150  AST 15  ALT 15  ALKPHOS 84  BILITOT 0.1*  PROT 7.1  ALBUMIN 3.6   No results for input(s): LIPASE, AMYLASE in the last 168 hours. No results for input(s): AMMONIA in the last 168 hours. Coagulation Profile: No results for input(s): INR, PROTIME in the last 168 hours. Cardiac Enzymes: No results for input(s): CKTOTAL, CKMB, CKMBINDEX, TROPONINI in the last 168 hours. BNP (last 3 results) No results for input(s): PROBNP in the last 8760 hours. HbA1C: No results for input(s): HGBA1C in the last 72 hours. CBG: No results for input(s): GLUCAP in the last 168 hours. Lipid Profile: No results for input(s): CHOL, HDL, LDLCALC, TRIG, CHOLHDL, LDLDIRECT in the last 72 hours. Thyroid Function Tests: No results for input(s): TSH, T4TOTAL, FREET4, T3FREE, THYROIDAB in the last 72 hours. Anemia Panel: No results for input(s): VITAMINB12, FOLATE, FERRITIN, TIBC, IRON, RETICCTPCT in the last 72 hours. Urine analysis:    Component Value Date/Time   COLORURINE YELLOW 05/17/2019 1414   APPEARANCEUR CLEAR 05/17/2019 1414   LABSPEC 1.010 05/17/2019 1414   PHURINE 7.0 05/17/2019 1414   GLUCOSEU NEGATIVE 05/17/2019 1414   HGBUR NEGATIVE 05/17/2019 1414   BILIRUBINUR NEGATIVE 05/17/2019 1414   BILIRUBINUR neg 07/31/2013 1033   KETONESUR NEGATIVE 05/17/2019 1414   PROTEINUR NEGATIVE 05/17/2019 1414   UROBILINOGEN 0.2 08/04/2013 1120   NITRITE NEGATIVE 05/17/2019 1414   LEUKOCYTESUR TRACE (A) 05/17/2019 1414    Radiological Exams on Admission: DG Chest 2 View  Result Date: 12/10/2021 CLINICAL DATA:  SHOB EXAM: CHEST - 2 VIEW COMPARISON:  November 23, 2021. FINDINGS: Chronic hyperinflation. No consolidation. No visible pleural effusions or pneumothorax. Cardiomediastinal  silhouette is within normal limits and similar to prior. IMPRESSION: Similar hyperinflation without evidence of acute cardiopulmonary disease. Electronically Signed   By: Margaretha Sheffield M.D.   On: 12/10/2021 12:42      Antonieta Pert MD [facility]  If 7PM-7AM, please contact night-coverage [email or web address]  12/10/2021, 3:11 PM

## 2021-12-10 NOTE — ED Triage Notes (Signed)
Pt reports low O2 levels at home. Pt reports seeing pulmonary doctor a week ago. Pt reports doctor told her low O2 could be coming form complications from COVID. Pt reports Hx COPD.  O2 in triage on room air 85%.

## 2021-12-10 NOTE — ED Notes (Signed)
Pt given water 

## 2021-12-10 NOTE — Assessment & Plan Note (Addendum)
With uncontrolled hyperglycemia likely in the setting of steroid use.  Blood sugar fairly improving after adjusting insulin.  Level fluctuates, being discharged on low-dose steroid she is on glipizide and Lantus at home which she will continue advised to check blood sugar ACHS and follow-up with PCP to adjust to blood sugar control continues to run in the high 200

## 2021-12-10 NOTE — Hospital Course (Addendum)
66 year old female with history of COPD, ongoing smoking, diabetes mellitus on insulin, hypertension, hyperlipidemia presented to the ED with complaint of ongoing shortness of breath congestion cough sputum production for at least a month with recent worsening.  She has tried several times with antibiotics and steroids, at least 2 rounds recently and. She saw her pulmonologist last week for a new visit and her inhalers were changed.  She complains of exertional dyspnea, no chest pain nausea vomiting focal weakness fever chills.  In the ED, routine labs stable negative D-dimer normal BNP , lab with uncontrolled hyperglycemia and 400 , was hypoxic saturating 85% on room air on arrival last night was 70% on room air at home.  Chest x-ray no acute finding respiratory virus panel Pending.  Patient is placed on supplemental oxygen given bronchodilators IV steroids and admission was requested for further management. Patient was managed IV steroids bronchodilators and insulin regimen for hyperglycemia. Patient also needed supplemental oxygen. With overnight steroid patient clinically much better.  No shortness of breath with ambulation.  Had some cough while doing deep breath. Patient will be ambulated without oxygen to check her symptoms as well as pulse ox to make sure she is stable, and qualify for home oxygen if needed. Patient ambulated and did well but needing oxygen 2 L.  She is comfortable going home, prefers to just take prednisone at 10 mg advised x5 days, doxycycline and follow-up with PCP and pulmonary.

## 2021-12-10 NOTE — Assessment & Plan Note (Signed)
See above

## 2021-12-11 DIAGNOSIS — J9601 Acute respiratory failure with hypoxia: Secondary | ICD-10-CM | POA: Diagnosis not present

## 2021-12-11 LAB — BASIC METABOLIC PANEL
Anion gap: 6 (ref 5–15)
BUN: 16 mg/dL (ref 8–23)
CO2: 28 mmol/L (ref 22–32)
Calcium: 8.8 mg/dL — ABNORMAL LOW (ref 8.9–10.3)
Chloride: 101 mmol/L (ref 98–111)
Creatinine, Ser: 0.63 mg/dL (ref 0.44–1.00)
GFR, Estimated: >60 mL/min (ref >60)
Glucose, Bld: 191 mg/dL — ABNORMAL HIGH (ref 70–99)
Potassium: 3.9 mmol/L (ref 3.5–5.1)
Sodium: 135 mmol/L (ref 135–145)

## 2021-12-11 LAB — GLUCOSE, CAPILLARY
Glucose-Capillary: 216 mg/dL — ABNORMAL HIGH (ref 70–99)
Glucose-Capillary: 250 mg/dL — ABNORMAL HIGH (ref 70–99)
Glucose-Capillary: 360 mg/dL — ABNORMAL HIGH (ref 70–99)
Glucose-Capillary: 93 mg/dL (ref 70–99)

## 2021-12-11 LAB — HIV ANTIBODY (ROUTINE TESTING W REFLEX): HIV Screen 4th Generation wRfx: NONREACTIVE

## 2021-12-11 MED ORDER — INSULIN GLARGINE-YFGN 100 UNIT/ML ~~LOC~~ SOLN
20.0000 [IU] | Freq: Every day | SUBCUTANEOUS | Status: DC
  Filled 2021-12-11: qty 0.2

## 2021-12-11 MED ORDER — GUAIFENESIN-DM 100-10 MG/5ML PO SYRP: 5.0000 mL | ORAL_SOLUTION | ORAL | 0 refills | Status: DC | PRN

## 2021-12-11 MED ORDER — DOXYCYCLINE HYCLATE 100 MG PO TABS: 100.0000 mg | ORAL_TABLET | Freq: Two times a day (BID) | ORAL | 0 refills | Status: AC

## 2021-12-11 MED ORDER — PREDNISONE 10 MG PO TABS: 10.0000 mg | ORAL_TABLET | Freq: Every day | ORAL | 0 refills | Status: AC

## 2021-12-11 NOTE — TOC Transition Note (Signed)
Transition of Care [facility]) - CM/SW Discharge Note   Patient Details  Name: Halli Neighbours MRN: [id number] Date of Birth: 1956/10/12  [facility] CM/SW Contact:  Dessa Phi, RN Phone Number: 12/11/2021, 2:05 PM   Clinical Narrative: Await [facility] delivery home 02 to rm prior d/c. No further CM needs.      Final next level of care: Home/Self Care Barriers to Discharge: No Barriers Identified   Patient Goals and CMS Choice   CMS Medicare.gov Compare Post Acute Care list provided to:: Patient Choice offered to / list presented to : Patient  Discharge Placement                       Discharge Plan and Services   Discharge Planning Services: CM Consult Post Acute Care Choice: Durable Medical Equipment          DME Arranged: Oxygen DME Agency: [facility] Date DME Agency Contacted: 12/11/21                Social Determinants of Health (SDOH) Interventions     Readmission Risk Interventions No flowsheet data found.

## 2021-12-11 NOTE — Progress Notes (Signed)
Patient provided AVS education. New medication education. All questions answered to patient satisfaction. Patient belongings returned, discharged via wheelchair to POV.

## 2021-12-11 NOTE — Progress Notes (Signed)
SATURATION QUALIFICATIONS: (This note is used to comply with regulatory documentation for home oxygen)  Patient Saturations on Room Air at Rest = 85% (after ambulating)  Patient Saturations on Room Air while Ambulating = 90%  Patient Saturations on 2 Liters of oxygen while Ambulating = 94%  Please briefly explain why patient needs home oxygen:   Patient desaturates after remaining off oxygen.  Patient is unable to recover to 90's without oxygen intervention.

## 2021-12-11 NOTE — Plan of Care (Signed)
POC adequate for dc ?

## 2021-12-11 NOTE — Discharge Summary (Signed)
Physician Discharge Summary   Patient: Patricia Kaiser MRN: [id number] DOB: [birth year]  Admit date:     12/10/2021  Discharge date: 12/11/21  Discharge Physician: Lanae Boast   PCP: Rebecka Apley, NP   Recommendations at discharge:    PCP PULM in 1 WK CBC BMP in 1 [facility]     Hospital Course: 66 year old female with history of COPD, ongoing smoking, diabetes mellitus on insulin, hypertension, hyperlipidemia presented to the ED with complaint of ongoing shortness of breath congestion cough sputum production for at least a month with recent worsening.  She has tried several times with antibiotics and steroids, at least 2 rounds recently and. She saw her pulmonologist last week for a new visit and her inhalers were changed.  She complains of exertional dyspnea, no chest pain nausea vomiting focal weakness fever chills.  In the ED, routine labs stable negative D-dimer normal BNP , lab with uncontrolled hyperglycemia and 400 , was hypoxic saturating 85% on room air on arrival last night was 70% on room air at home.  Chest x-ray no acute finding respiratory virus panel Pending.  Patient is placed on supplemental oxygen given bronchodilators IV steroids and admission was requested for further management. Patient was managed IV steroids bronchodilators and insulin regimen for hyperglycemia. Patient also needed supplemental oxygen. With overnight steroid patient clinically much better.  No shortness of breath with ambulation.  Had some cough while doing deep breath. Patient will be ambulated without oxygen to check her symptoms as well as pulse ox to make sure she is stable, and qualify for home oxygen if needed. Patient ambulated and did well but needing oxygen 2 L.  She is comfortable going home, prefers to just take prednisone at 10 mg advised x5 days, doxycycline and follow-up with PCP and pulmonary.  Discharge Diagnoses: Principal Problem:   Acute respiratory failure with hypoxemia  (HCC) Active Problems:   Hypertension   COPD exacerbation (HCC)   Hyperlipemia   Uncontrolled type 2 diabetes mellitus with hyperglycemia, with long-term current use of insulin (HCC) * Acute respiratory failure with hypoxemia (HCC)- (present on admission) Acute COPD exacerbation: At this time clinically improved however she will need to go home with oxygen.  Continue prednisone doxycycline short course, patient prefers to be on only 10 mg prednisone, she will resume home inhalers follow-up with her pulmonary doctor, antitussives will be continued.    Uncontrolled type 2 diabetes mellitus with hyperglycemia, with long-term current use of insulin (HCC) With uncontrolled hyperglycemia likely in the setting of steroid use.  Blood sugar fairly improving after adjusting insulin.  Level fluctuates, being discharged on low-dose steroid she is on glipizide and Lantus at home which she will continue advised to check blood sugar ACHS and follow-up with PCP to adjust to blood sugar control continues to run in the high 200   Hyperlipemia- (present on admission) Continue statin  COPD exacerbation (HCC)- (present on admission) See above  Hypertension- (present on admission) Continue home medication         Consultants: none Procedures performed: none  Disposition: home Diet recommendation:  Carb modified diet  DISCHARGE MEDICATION: Allergies as of 12/11/2021       Reactions   Cefdinir Hives, Itching, Rash   blister   Sulfa Antibiotics Anaphylaxis, Shortness Of Breath   Codeine Nausea Only   Erythromycin Hives   Gabapentin Nausea And Vomiting   GI upset, pt claims it made her "drunk"   Metformin And Related Nausea And Vomiting   Symbicort [budesonide-formoterol  Fumarate]    Breaks mouth out        Medication List     STOP taking these medications    amoxicillin-clavulanate 875-125 MG tablet Commonly known as: AUGMENTIN   cephALEXin 250 MG capsule Commonly known as:  KEFLEX   umeclidinium bromide 62.5 MCG/ACT Aepb Commonly known as: Incruse Ellipta       TAKE these medications    acetaminophen 500 MG tablet Commonly known as: TYLENOL Take 1,000 mg by mouth every 6 (six) hours as needed for pain.   albuterol (2.5 MG/3ML) 0.083% nebulizer solution Commonly known as: PROVENTIL Take 3 mLs (2.5 mg total) by nebulization every 6 (six) hours as needed for wheezing or shortness of breath.   atorvastatin 20 MG tablet Commonly known as: LIPITOR TAKE 1 TABLET (20 MG TOTAL) BY MOUTH DAILY. What changed: when to take this   Breztri Aerosphere 160-9-4.8 MCG/ACT Aero Generic drug: Budeson-Glycopyrrol-Formoterol Inhale 2 puffs into the lungs in the morning and at bedtime.   doxycycline 100 MG tablet Commonly known as: VIBRA-TABS Take 1 tablet (100 mg total) by mouth every 12 (twelve) hours for 5 days.   glipiZIDE 5 MG tablet Commonly known as: GLUCOTROL TAKE 1 TABLET (5 MG TOTAL) BY MOUTH 2 (TWO) TIMES DAILY BEFORE A MEAL. What changed: when to take this   guaiFENesin-dextromethorphan 100-10 MG/5ML syrup Commonly known as: ROBITUSSIN DM Take 5 mLs by mouth every 4 (four) hours as needed for cough (chest congestion).   ibuprofen 800 MG tablet Commonly known as: ADVIL Take 800 mg by mouth every 8 (eight) hours as needed for moderate pain.   lisinopril 2.5 MG tablet Commonly known as: ZESTRIL Take 2.5 mg by mouth daily.   LORazepam 0.5 MG tablet Commonly known as: ATIVAN Take 0.5 mg by mouth daily as needed for anxiety.   omeprazole 20 MG capsule Commonly known as: PRILOSEC Take 20 mg by mouth daily.   predniSONE 10 MG tablet Commonly known as: DELTASONE Take 1 tablet (10 mg total) by mouth daily for 5 days.   Premarin 1.25 MG tablet Generic drug: estrogens (conjugated) TAKE 1 TABLET (1.25 MG TOTAL) BY MOUTH DAILY. What changed: See the new instructions.   Semglee (yfgn) 100 UNIT/ML Pen Generic drug: insulin glargine-yfgn Inject 20  Units into the skin at bedtime.   sertraline 50 MG tablet Commonly known as: ZOLOFT Take 50 mg by mouth daily.               Durable Medical Equipment  (From admission, onward)           Start     Ordered   12/11/21 1233  For home use only DME oxygen  Once       Question Answer Comment  Length of Need Lifetime   Mode or (Route) Nasal cannula   Liters per Minute 2   Frequency Continuous (stationary and portable oxygen unit needed)   Oxygen delivery system Gas      12/11/21 1232            Follow-up Information     Hemberg, Ruby Cola, NP Follow up in 1 week(s).   Specialty: Adult Health Nurse Practitioner Contact information: [address] [place] [address] [phone]                 Discharge Exam: Ceasar Mons Weights   12/10/21 1149  Weight: 49.9 kg   Condition at discharge: good  The results of significant diagnostics from this hospitalization (including  imaging, microbiology, ancillary and laboratory) are listed below for reference.   Imaging Studies: DG Chest 2 View  Result Date: 12/10/2021 CLINICAL DATA:  SHOB EXAM: CHEST - 2 VIEW COMPARISON:  November 23, 2021. FINDINGS: Chronic hyperinflation. No consolidation. No visible pleural effusions or pneumothorax. Cardiomediastinal silhouette is within normal limits and similar to prior. IMPRESSION: Similar hyperinflation without evidence of acute cardiopulmonary disease. Electronically Signed   By: Feliberto HartsFrederick S Jones M.D.   On: 12/10/2021 12:42   DG Chest 2 View  Result Date: 11/23/2021 CLINICAL DATA:  Nonproductive cough and shortness of breath for 2 weeks. EXAM: CHEST - 2 VIEW COMPARISON:  05/20/2019. FINDINGS: Trachea is midline. Heart size normal. Thoracic aorta is calcified. Lungs are hyperinflated but clear. No pleural fluid. IMPRESSION: Hyperinflation without acute finding. Electronically Signed   By: Leanna BattlesMelinda  Blietz M.D.   On: 11/23/2021 11:35    Microbiology: Results  for orders placed or performed during the hospital encounter of 12/10/21  Resp Panel by RT-PCR (Flu A&B, Covid) Nasopharyngeal Swab     Status: None   Collection Time: 12/10/21  2:28 PM   Specimen: Nasopharyngeal Swab; Nasopharyngeal(NP) swabs in vial transport medium  Result Value Ref Range Status   SARS Coronavirus 2 by RT PCR NEGATIVE NEGATIVE Final    Comment: (NOTE) SARS-CoV-2 target nucleic acids are NOT DETECTED.  The SARS-CoV-2 RNA is generally detectable in upper respiratory specimens during the acute phase of infection. The lowest concentration of SARS-CoV-2 viral copies this assay can detect is 138 copies/mL. A negative result does not preclude SARS-Cov-2 infection and should not be used as the sole basis for treatment or other patient management decisions. A negative result may occur with  improper specimen collection/handling, submission of specimen other than nasopharyngeal swab, presence of viral mutation(s) within the areas targeted by this assay, and inadequate number of viral copies(<138 copies/mL). A negative result must be combined with clinical observations, patient history, and epidemiological information. The expected result is Negative.  Fact Sheet for Patients:  BloggerCourse.comhttps://www.fda.gov/media/152166/download  Fact Sheet for Healthcare Providers:  SeriousBroker.ithttps://www.fda.gov/media/152162/download  This test is no t yet approved or cleared by the [place] FDA and  has been authorized for detection and/or diagnosis of SARS-CoV-2 by FDA under an Emergency Use Authorization (EUA). This EUA will remain  in effect (meaning this test can be used) for the duration of the COVID-19 declaration under Section 564(b)(1) of the Act, 21 U.S.C.section 360bbb-3(b)(1), unless the authorization is terminated  or revoked sooner.       Influenza A by PCR NEGATIVE NEGATIVE Final   Influenza B by PCR NEGATIVE NEGATIVE Final    Comment: (NOTE) The Xpert Xpress SARS-CoV-2/FLU/RSV plus  assay is intended as an aid in the diagnosis of influenza from Nasopharyngeal swab specimens and should not be used as a sole basis for treatment. Nasal washings and aspirates are unacceptable for Xpert Xpress SARS-CoV-2/FLU/RSV testing.  Fact Sheet for Patients: BloggerCourse.comhttps://www.fda.gov/media/152166/download  Fact Sheet for Healthcare Providers: SeriousBroker.ithttps://www.fda.gov/media/152162/download  This test is not yet approved or cleared by the [place] FDA and has been authorized for detection and/or diagnosis of SARS-CoV-2 by FDA under an Emergency Use Authorization (EUA). This EUA will remain in effect (meaning this test can be used) for the duration of the COVID-19 declaration under Section 564(b)(1) of the Act, 21 U.S.C. section 360bbb-3(b)(1), unless the authorization is terminated or revoked.  [facility]formed [facility], [address] [place], [place] [address]     Labs: CBC: Recent Labs  Lab  12/10/21 1150  WBC 11.2*  NEUTROABS 8.1*  HGB 14.9  HCT 45.1  MCV 94.9  PLT 296   Basic Metabolic Panel: Recent Labs  Lab 12/10/21 1150 12/11/21 0457  NA 134* 135  K 4.5 3.9  CL 98 101  CO2 28 28  GLUCOSE 435* 191*  BUN 13 16  CREATININE 0.63 0.63  CALCIUM 9.1 8.8*   Liver Function Tests: Recent Labs  Lab 12/10/21 1150  AST 15  ALT 15  ALKPHOS 84  BILITOT 0.1*  PROT 7.1  ALBUMIN 3.6   CBG: Recent Labs  Lab 12/10/21 2004 12/11/21 0006 12/11/21 0403 12/11/21 0741 12/11/21 1124  GLUCAP 267* 360* 250* 93 216*    Discharge time spent: 25 minutes.  Signed: Lanae Boast, MD [facility] 12/11/2021

## 2021-12-11 NOTE — Progress Notes (Incomplete)
SATURATION QUALIFICATIONS: (This note is used to comply with regulatory documentation for home oxygen) ° °Patient Saturations on Room Air at Rest = 85% (after ambulating) ° °Patient Saturations on Room Air while Ambulating = 90% ° °Patient Saturations on 2 Liters of oxygen while Ambulating = 94% ° °Please briefly explain why patient needs home oxygen:  ° °Patient desaturates after remaining off oxygen.  Patient is unable to recover to 90's without oxygen intervention.   °

## 2021-12-11 NOTE — Progress Notes (Signed)
Inpatient Diabetes Program Recommendations  AACE/ADA: New Consensus Statement on Inpatient Glycemic Control (2015)  Target Ranges:  Prepandial:   less than 140 mg/dL      Peak postprandial:   less than 180 mg/dL (1-2 hours)      Critically ill patients:  140 - 180 mg/dL   Lab Results  Component Value Date   GLUCAP 93 12/11/2021   HGBA1C 8.2 (H) 05/21/2019    Review of Glycemic Control  Diabetes history: DM2 Outpatient Diabetes medications: Lantus  Current orders for Inpatient glycemic control: Semglee 20 QHS, Novolog 0-15 units Q4H, glipizide 5 mg QAM On Solumedrol 40 Q24H. Received Solumedrol 125 mg on 2/23 @ 1430  HgbA1C - 8.2% on 05/21/19  Inpatient Diabetes Program Recommendations:    D/C glipizide while inpatient to avoid hypoglycemia per ADA guidelines  Need updated HgbA1C to assess glycemic control prior to admission  Change Novolog to 0-15 units TID with meals and 0-5 HS  Will continue to follow while inpatient. Watch glucose trends.  Thank you. Ailene Ards, RD, LDN, CDE Inpatient Diabetes Coordinator [phone]

## 2021-12-12 LAB — HEMOGLOBIN A1C
Hgb A1c MFr Bld: 10.7 % — ABNORMAL HIGH (ref 4.8–5.6)
Mean Plasma Glucose: 260 mg/dL

## 2022-04-04 ENCOUNTER — Encounter (HOSPITAL_BASED_OUTPATIENT_CLINIC_OR_DEPARTMENT_OTHER): Payer: Self-pay

## 2022-04-04 ENCOUNTER — Emergency Department (HOSPITAL_BASED_OUTPATIENT_CLINIC_OR_DEPARTMENT_OTHER)
Admission: EM | Admit: 2022-04-04 | Discharge: 2022-04-04 | Disposition: A | Discharge status: Home / Self Care | Payer: BC Managed Care – PPO | Attending: Emergency Medicine | Admitting: Emergency Medicine

## 2022-04-04 ENCOUNTER — Other Ambulatory Visit: Payer: Self-pay

## 2022-04-04 ENCOUNTER — Emergency Department (HOSPITAL_BASED_OUTPATIENT_CLINIC_OR_DEPARTMENT_OTHER): Payer: BC Managed Care – PPO

## 2022-04-04 DIAGNOSIS — R0602 Shortness of breath: Secondary | ICD-10-CM | POA: Insufficient documentation

## 2022-04-04 DIAGNOSIS — R0981 Nasal congestion: Secondary | ICD-10-CM | POA: Diagnosis not present

## 2022-04-04 DIAGNOSIS — Z7951 Long term (current) use of inhaled steroids: Secondary | ICD-10-CM | POA: Insufficient documentation

## 2022-04-04 DIAGNOSIS — J449 Chronic obstructive pulmonary disease, unspecified: Secondary | ICD-10-CM | POA: Diagnosis not present

## 2022-04-04 DIAGNOSIS — Z20822 Contact with and (suspected) exposure to covid-19: Secondary | ICD-10-CM | POA: Diagnosis not present

## 2022-04-04 DIAGNOSIS — R531 Weakness: Secondary | ICD-10-CM | POA: Diagnosis not present

## 2022-04-04 LAB — COMPREHENSIVE METABOLIC PANEL
ALT: 24 U/L (ref 0–44)
AST: 23 U/L (ref 15–41)
Albumin: 3.7 g/dL (ref 3.5–5.0)
Alkaline Phosphatase: 68 U/L (ref 38–126)
Anion gap: 5 (ref 5–15)
BUN: 11 mg/dL (ref 8–23)
CO2: 30 mmol/L (ref 22–32)
Calcium: 8.8 mg/dL — ABNORMAL LOW (ref 8.9–10.3)
Chloride: 103 mmol/L (ref 98–111)
Creatinine, Ser: 0.77 mg/dL (ref 0.44–1.00)
GFR, Estimated: >60 mL/min (ref >60)
Glucose, Bld: 282 mg/dL — ABNORMAL HIGH (ref 70–99)
Potassium: 3.9 mmol/L (ref 3.5–5.1)
Sodium: 138 mmol/L (ref 135–145)
Total Bilirubin: 0.4 mg/dL (ref 0.3–1.2)
Total Protein: 6.9 g/dL (ref 6.5–8.1)

## 2022-04-04 LAB — CBC WITH DIFFERENTIAL/PLATELET
Abs Immature Granulocytes: 0.05 10*3/uL (ref 0.00–0.07)
Basophils Absolute: 0.1 10*3/uL (ref 0.0–0.1)
Basophils Relative: 0 %
Eosinophils Absolute: 0.1 10*3/uL (ref 0.0–0.5)
Eosinophils Relative: 1 %
HCT: 44.7 % (ref 36.0–46.0)
Hemoglobin: 14.7 g/dL (ref 12.0–15.0)
Immature Granulocytes: 0 %
Lymphocytes Relative: 27 %
Lymphs Abs: 3.6 10*3/uL (ref 0.7–4.0)
MCH: 31.3 pg (ref 26.0–34.0)
MCHC: 32.9 g/dL (ref 30.0–36.0)
MCV: 95.1 fL (ref 80.0–100.0)
Monocytes Absolute: 0.6 10*3/uL (ref 0.1–1.0)
Monocytes Relative: 4 %
Neutro Abs: 9.1 10*3/uL — ABNORMAL HIGH (ref 1.7–7.7)
Neutrophils Relative %: 68 %
Platelets: 240 10*3/uL (ref 150–400)
RBC: 4.7 MIL/uL (ref 3.87–5.11)
RDW: 13.1 % (ref 11.5–15.5)
WBC: 13.5 10*3/uL — ABNORMAL HIGH (ref 4.0–10.5)
nRBC: 0 % (ref 0.0–0.2)

## 2022-04-04 LAB — TROPONIN I (HIGH SENSITIVITY): Troponin I (High Sensitivity): 3 ng/L (ref <18)

## 2022-04-04 LAB — SARS CORONAVIRUS 2 BY RT PCR: SARS Coronavirus 2 by RT PCR: NEGATIVE

## 2022-04-04 MED ORDER — SODIUM CHLORIDE 0.9 % IV BOLUS
1000.0000 mL | Freq: Once | INTRAVENOUS | Status: AC
  Administered 2022-04-04: 1000 mL via INTRAVENOUS

## 2022-04-04 MED ORDER — ALBUTEROL SULFATE HFA 108 (90 BASE) MCG/ACT IN AERS: 6.0000 | INHALATION_SPRAY | Freq: Once | RESPIRATORY_TRACT | Status: DC

## 2022-04-04 MED ORDER — AMOXICILLIN-POT CLAVULANATE 875-125 MG PO TABS: 1.0000 | ORAL_TABLET | Freq: Two times a day (BID) | ORAL | 0 refills | Status: DC

## 2022-04-04 MED ORDER — IPRATROPIUM BROMIDE HFA 17 MCG/ACT IN AERS: 2.0000 | INHALATION_SPRAY | Freq: Once | RESPIRATORY_TRACT | Status: DC

## 2022-04-04 MED ORDER — FLUCONAZOLE 200 MG PO TABS: 200.0000 mg | ORAL_TABLET | Freq: Once | ORAL | Status: DC

## 2022-04-04 MED ORDER — AMOXICILLIN-POT CLAVULANATE 875-125 MG PO TABS
1.0000 | ORAL_TABLET | Freq: Once | ORAL | Status: AC
  Administered 2022-04-04: 1 via ORAL
  Filled 2022-04-04: qty 1

## 2022-04-04 MED ORDER — FLUCONAZOLE 150 MG PO TABS
150.0000 mg | ORAL_TABLET | Freq: Once | ORAL | Status: AC
  Administered 2022-04-04: 150 mg via ORAL
  Filled 2022-04-04: qty 1

## 2022-04-04 MED ORDER — IPRATROPIUM-ALBUTEROL 0.5-2.5 (3) MG/3ML IN SOLN
3.0000 mL | Freq: Once | RESPIRATORY_TRACT | Status: AC
  Administered 2022-04-04: 3 mL via RESPIRATORY_TRACT
  Filled 2022-04-04: qty 3

## 2022-04-04 MED ORDER — ALBUTEROL SULFATE (2.5 MG/3ML) 0.083% IN NEBU
5.0000 mg | INHALATION_SOLUTION | Freq: Once | RESPIRATORY_TRACT | Status: AC
  Administered 2022-04-04: 5 mg via RESPIRATORY_TRACT
  Filled 2022-04-04: qty 6

## 2022-04-04 NOTE — ED Triage Notes (Signed)
C/O shortness of breathe and fatigue; Supplemental 02 at HS; reported feeling unwell the last 2 weeks; "can't breathe,  I'm weak"

## 2022-04-04 NOTE — ED Provider Notes (Signed)
[facility] EMERGENCY DEPARTMENT Provider Note   CSN: [id number] Arrival date & time: 04/04/22  1633     History  Chief Complaint  Patient presents with   Shortness of Breath    Patricia Kaiser is a 66 y.o. female.  Patient with history of COPD who presents with some generalized weakness, shortness of breath, congestion.  Nothing has made it worse or better.  Wears nighttime oxygen.  But does not use portable oxygen.  Just finished a course of antibiotics and steroids recently.  Denies any chest pain, nausea, vomiting, diarrhea, abdominal pain, headache.  Overall just has fatigue and feels like she has a sinus infection.  The history is provided by the patient.       Home Medications Prior to Admission medications   Medication Sig Start Date End Date Taking? Authorizing Provider  amoxicillin-clavulanate (AUGMENTIN) 875-125 MG tablet Take 1 tablet by mouth 2 (two) times daily for 7 days. 04/04/22 04/11/22 Yes Jillyn Stacey, DO  acetaminophen (TYLENOL) 500 MG tablet Take 1,000 mg by mouth every 6 (six) hours as needed for pain.    [provider]  albuterol (PROVENTIL) (2.5 MG/3ML) 0.083% nebulizer solution Take 3 mLs (2.5 mg total) by nebulization every 6 (six) hours as needed for wheezing or shortness of breath. 11/23/21   Horton, Danford Bad M, DO  atorvastatin (LIPITOR) 20 MG tablet TAKE 1 TABLET (20 MG TOTAL) BY MOUTH DAILY. Patient taking differently: Take 20 mg by mouth at bedtime.    Martin, Mary-Margaret, FNP  BREZTRI AEROSPHERE 160-9-4.8 MCG/ACT AERO Inhale 2 puffs into the lungs in the morning and at bedtime. 11/30/21   [provider]  glipiZIDE (GLUCOTROL) 5 MG tablet TAKE 1 TABLET (5 MG TOTAL) BY MOUTH 2 (TWO) TIMES DAILY BEFORE A MEAL. Patient taking differently: Take 5 mg by mouth daily before breakfast. 06/17/14   Daphine Deutscher, Mary-Margaret, FNP  guaiFENesin-dextromethorphan (ROBITUSSIN DM) 100-10 MG/5ML syrup Take 5 mLs by mouth every 4 (four) hours  as needed for cough (chest congestion). 12/11/21   Lanae Boast, MD  ibuprofen (ADVIL) 800 MG tablet Take 800 mg by mouth every 8 (eight) hours as needed for moderate pain. 08/29/21   [provider]  lisinopril (ZESTRIL) 2.5 MG tablet Take 2.5 mg by mouth daily. 05/02/19   [provider]  LORazepam (ATIVAN) 0.5 MG tablet Take 0.5 mg by mouth daily as needed for anxiety. 11/20/21   [provider]  omeprazole (PRILOSEC) 20 MG capsule Take 20 mg by mouth daily. 04/14/19   [provider]  PREMARIN 1.25 MG tablet TAKE 1 TABLET (1.25 MG TOTAL) BY MOUTH DAILY. Patient taking differently: Take 1.25 mg by mouth at bedtime. 09/20/14   Martin, Mary-Margaret, FNP  SEMGLEE, YFGN, 100 UNIT/ML Pen Inject 20 Units into the skin at bedtime. 12/09/21   [provider]  sertraline (ZOLOFT) 50 MG tablet Take 50 mg by mouth daily. 11/11/21   [provider]      Allergies    Cefdinir, Sulfa antibiotics, Codeine, Erythromycin, Gabapentin, Metformin and related, and Symbicort [budesonide-formoterol fumarate]    Review of Systems   Review of Systems  Physical Exam Updated Vital Signs BP 106/61   Pulse 78   Temp 98.7 F (37.1 C) (Oral)   Resp (!) 22   SpO2 94%  Physical Exam Vitals and nursing note reviewed.  Constitutional:      General: She is not in acute distress.    Appearance: She is well-developed. She is not ill-appearing.  HENT:  Head: Normocephalic and atraumatic.     Mouth/Throat:     Mouth: Mucous membranes are moist.  Eyes:     Extraocular Movements: Extraocular movements intact.     Conjunctiva/sclera: Conjunctivae normal.     Pupils: Pupils are equal, round, and reactive to light.  Cardiovascular:     Rate and Rhythm: Normal rate and regular rhythm.     Pulses: Normal pulses.     Heart sounds: Normal heart sounds. No murmur heard. Pulmonary:     Effort: Pulmonary effort is normal. No respiratory distress.     Breath sounds: Wheezing  present.  Abdominal:     Palpations: Abdomen is soft.     Tenderness: There is no abdominal tenderness.  Musculoskeletal:        General: No swelling.     Cervical back: Normal range of motion and neck supple.  Skin:    General: Skin is warm and dry.     Capillary Refill: Capillary refill takes less than 2 seconds.  Neurological:     General: No focal deficit present.     Mental Status: She is alert.  Psychiatric:        Mood and Affect: Mood normal.     ED Results / Procedures / Treatments   Labs (all labs ordered are listed, but only abnormal results are displayed) Labs Reviewed  CBC WITH DIFFERENTIAL/PLATELET - Abnormal; Notable for the following components:      Result Value   WBC 13.5 (*)    Neutro Abs 9.1 (*)    All other components within normal limits  COMPREHENSIVE METABOLIC PANEL - Abnormal; Notable for the following components:   Glucose, Bld 282 (*)    Calcium 8.8 (*)    All other components within normal limits  SARS CORONAVIRUS 2 BY RT PCR  TROPONIN I (HIGH SENSITIVITY)    EKG EKG Interpretation  Date/Time:  Sunday April 04 2022 17:00:57 EDT Ventricular Rate:  83 PR Interval:  111 QRS Duration: 65 QT Interval:  329 QTC Calculation: 387 R Axis:   58 Text Interpretation: Sinus rhythm Borderline short PR interval Right atrial enlargement Confirmed by Virgina Norfolk ([id number]) on 04/04/2022 5:03:47 PM  Radiology DG Chest Portable 1 View  Result Date: 04/04/2022 CLINICAL DATA:  Cough, shortness of breath and fatigue. EXAM: PORTABLE CHEST 1 VIEW COMPARISON:  12/10/2021. FINDINGS: Normal heart size. No pleural effusion or edema. No airspace opacities identified. The visualized osseous structures are unremarkable. IMPRESSION: No acute cardiopulmonary abnormalities. Electronically Signed   By: Signa Kell M.D.   On: 04/04/2022 17:19    Procedures Procedures    Medications Ordered in ED Medications  fluconazole (DIFLUCAN) tablet 200 mg (has no administration  in time range)  albuterol (PROVENTIL) (2.5 MG/3ML) 0.083% nebulizer solution 5 mg (5 mg Nebulization Given 04/04/22 1716)  ipratropium-albuterol (DUONEB) 0.5-2.5 (3) MG/3ML nebulizer solution 3 mL (3 mLs Nebulization Given 04/04/22 1716)  sodium chloride 0.9 % bolus 1,000 mL (1,000 mLs Intravenous New Bag/Given 04/04/22 1752)    ED Course/ Medical Decision Making/ A&P                           Medical Decision Making Amount and/or Complexity of Data Reviewed Labs: ordered. Radiology: ordered.  Risk Prescription drug management.   Patricia Kaiser is here with sinus congestion, shortness of breath.  History of COPD.  Finished a course of steroids recently.  Overall she appears well.  Normal vitals.  Normal room  air oxygenation both at rest and with ambulation.  Very mild wheezing.  She thinks she has a congestion of her nose.  Overall she denies any chest pain.  Differential diagnosis is may be mild COPD exacerbation versus viral process versus pneumonia.  No real concern for ACS but will get troponin and EKG.  We will get CBC, CMP, chest x-ray, COVID test.  Chest x-ray per my review and interpretation shows no pneumonia or pneumothorax.  EKG shows sinus rhythm with no ischemic changes.  Troponin is normal.  Doubt ACS.  Per further review and interpretation of labs is no significant anemia or leukocytosis or electrolyte abnormality.  Overall suspect viral process.  COVID test was negative.  She felt better after breathing treatment and IV fluids.  Discharged in good condition with prescription for Augmentin.  Understands return precautions.        Final Clinical Impression(s) / ED Diagnoses Final diagnoses:  Sinus congestion    Rx / DC Orders ED Discharge Orders          Ordered    amoxicillin-clavulanate (AUGMENTIN) 875-125 MG tablet  2 times daily        04/04/22 1818              Virgina Norfolk, DO 04/04/22 1822

## 2022-04-08 ENCOUNTER — Emergency Department (HOSPITAL_COMMUNITY): Payer: BC Managed Care – PPO

## 2022-04-08 ENCOUNTER — Other Ambulatory Visit: Payer: Self-pay

## 2022-04-08 ENCOUNTER — Other Ambulatory Visit (HOSPITAL_COMMUNITY): Payer: Self-pay

## 2022-04-08 ENCOUNTER — Observation Stay (HOSPITAL_COMMUNITY)
Admission: EM | Admit: 2022-04-08 | Discharge: 2022-04-09 | Disposition: A | Discharge status: Home / Self Care | Payer: BC Managed Care – PPO | Attending: Internal Medicine | Admitting: Internal Medicine

## 2022-04-08 ENCOUNTER — Encounter (HOSPITAL_COMMUNITY): Payer: Self-pay | Admitting: Emergency Medicine

## 2022-04-08 DIAGNOSIS — R0602 Shortness of breath: Secondary | ICD-10-CM | POA: Diagnosis present

## 2022-04-08 DIAGNOSIS — Z885 Allergy status to narcotic agent status: Secondary | ICD-10-CM | POA: Diagnosis not present

## 2022-04-08 DIAGNOSIS — Z20822 Contact with and (suspected) exposure to covid-19: Secondary | ICD-10-CM | POA: Diagnosis not present

## 2022-04-08 DIAGNOSIS — F17210 Nicotine dependence, cigarettes, uncomplicated: Secondary | ICD-10-CM | POA: Diagnosis present

## 2022-04-08 DIAGNOSIS — Z833 Family history of diabetes mellitus: Secondary | ICD-10-CM

## 2022-04-08 DIAGNOSIS — Z8249 Family history of ischemic heart disease and other diseases of the circulatory system: Secondary | ICD-10-CM

## 2022-04-08 DIAGNOSIS — Z91119 Patient's noncompliance with dietary regimen due to unspecified reason: Secondary | ICD-10-CM

## 2022-04-08 DIAGNOSIS — Z888 Allergy status to other drugs, medicaments and biological substances status: Secondary | ICD-10-CM

## 2022-04-08 DIAGNOSIS — Z83438 Family history of other disorder of lipoprotein metabolism and other lipidemia: Secondary | ICD-10-CM | POA: Diagnosis not present

## 2022-04-08 DIAGNOSIS — I10 Essential (primary) hypertension: Secondary | ICD-10-CM | POA: Insufficient documentation

## 2022-04-08 DIAGNOSIS — Z794 Long term (current) use of insulin: Secondary | ICD-10-CM | POA: Insufficient documentation

## 2022-04-08 DIAGNOSIS — Z8616 Personal history of COVID-19: Secondary | ICD-10-CM

## 2022-04-08 DIAGNOSIS — Z79899 Other long term (current) drug therapy: Secondary | ICD-10-CM | POA: Insufficient documentation

## 2022-04-08 DIAGNOSIS — Z825 Family history of asthma and other chronic lower respiratory diseases: Secondary | ICD-10-CM

## 2022-04-08 DIAGNOSIS — J441 Chronic obstructive pulmonary disease with (acute) exacerbation: Secondary | ICD-10-CM | POA: Diagnosis not present

## 2022-04-08 DIAGNOSIS — E1165 Type 2 diabetes mellitus with hyperglycemia: Secondary | ICD-10-CM | POA: Diagnosis not present

## 2022-04-08 DIAGNOSIS — Z823 Family history of stroke: Secondary | ICD-10-CM

## 2022-04-08 DIAGNOSIS — Z882 Allergy status to sulfonamides status: Secondary | ICD-10-CM | POA: Diagnosis not present

## 2022-04-08 DIAGNOSIS — J9601 Acute respiratory failure with hypoxia: Secondary | ICD-10-CM | POA: Insufficient documentation

## 2022-04-08 DIAGNOSIS — J9621 Acute and chronic respiratory failure with hypoxia: Secondary | ICD-10-CM | POA: Diagnosis present

## 2022-04-08 DIAGNOSIS — E119 Type 2 diabetes mellitus without complications: Secondary | ICD-10-CM

## 2022-04-08 LAB — CBG MONITORING, ED
Glucose-Capillary: 443 mg/dL — ABNORMAL HIGH (ref 70–99)
Glucose-Capillary: 446 mg/dL — ABNORMAL HIGH (ref 70–99)
Glucose-Capillary: 358 mg/dL — ABNORMAL HIGH (ref 70–99)
Glucose-Capillary: 119 mg/dL — ABNORMAL HIGH (ref 70–99)

## 2022-04-08 LAB — RESPIRATORY PANEL BY PCR

## 2022-04-08 LAB — BASIC METABOLIC PANEL
Anion gap: 14 (ref 5–15)
BUN: 7 mg/dL — ABNORMAL LOW (ref 8–23)
CO2: 25 mmol/L (ref 22–32)
Calcium: 8.5 mg/dL — ABNORMAL LOW (ref 8.9–10.3)
Chloride: 99 mmol/L (ref 98–111)
Creatinine, Ser: 0.8 mg/dL (ref 0.44–1.00)
GFR, Estimated: >60 mL/min (ref >60)
Glucose, Bld: 401 mg/dL — ABNORMAL HIGH (ref 70–99)
Potassium: 3.8 mmol/L (ref 3.5–5.1)
Sodium: 138 mmol/L (ref 135–145)

## 2022-04-08 LAB — COMPREHENSIVE METABOLIC PANEL
ALT: 32 U/L (ref 0–44)
AST: 34 U/L (ref 15–41)
Albumin: 3.1 g/dL — ABNORMAL LOW (ref 3.5–5.0)
Alkaline Phosphatase: 65 U/L (ref 38–126)
Anion gap: 10 (ref 5–15)
BUN: 5 mg/dL — ABNORMAL LOW (ref 8–23)
CO2: 29 mmol/L (ref 22–32)
Calcium: 8.5 mg/dL — ABNORMAL LOW (ref 8.9–10.3)
Chloride: 99 mmol/L (ref 98–111)
Creatinine, Ser: 0.63 mg/dL (ref 0.44–1.00)
GFR, Estimated: >60 mL/min (ref >60)
Glucose, Bld: 271 mg/dL — ABNORMAL HIGH (ref 70–99)
Potassium: 3.8 mmol/L (ref 3.5–5.1)
Sodium: 138 mmol/L (ref 135–145)
Total Bilirubin: 0.3 mg/dL (ref 0.3–1.2)
Total Protein: 5.8 g/dL — ABNORMAL LOW (ref 6.5–8.1)

## 2022-04-08 LAB — CBC WITH DIFFERENTIAL/PLATELET
Abs Immature Granulocytes: 0.04 10*3/uL (ref 0.00–0.07)
Basophils Absolute: 0 10*3/uL (ref 0.0–0.1)
Basophils Relative: 0 %
Eosinophils Absolute: 0.2 10*3/uL (ref 0.0–0.5)
Eosinophils Relative: 1 %
HCT: 42.6 % (ref 36.0–46.0)
Hemoglobin: 13.1 g/dL (ref 12.0–15.0)
Immature Granulocytes: 0 %
Lymphocytes Relative: 21 %
Lymphs Abs: 2.9 10*3/uL (ref 0.7–4.0)
MCH: 30.5 pg (ref 26.0–34.0)
MCHC: 30.8 g/dL (ref 30.0–36.0)
MCV: 99.1 fL (ref 80.0–100.0)
Monocytes Absolute: 0.8 10*3/uL (ref 0.1–1.0)
Monocytes Relative: 6 %
Neutro Abs: 9.9 10*3/uL — ABNORMAL HIGH (ref 1.7–7.7)
Neutrophils Relative %: 72 %
Platelets: 199 10*3/uL (ref 150–400)
RBC: 4.3 MIL/uL (ref 3.87–5.11)
RDW: 12.6 % (ref 11.5–15.5)
WBC: 13.9 10*3/uL — ABNORMAL HIGH (ref 4.0–10.5)
nRBC: 0 % (ref 0.0–0.2)

## 2022-04-08 LAB — I-STAT CHEM 8, ED
BUN: 5 mg/dL — ABNORMAL LOW (ref 8–23)
Calcium, Ion: 1.05 mmol/L — ABNORMAL LOW (ref 1.15–1.40)
Chloride: 97 mmol/L — ABNORMAL LOW (ref 98–111)
Creatinine, Ser: 0.4 mg/dL — ABNORMAL LOW (ref 0.44–1.00)
Glucose, Bld: 276 mg/dL — ABNORMAL HIGH (ref 70–99)
HCT: 42 % (ref 36.0–46.0)
Hemoglobin: 14.3 g/dL (ref 12.0–15.0)
Potassium: 3.9 mmol/L (ref 3.5–5.1)
Sodium: 136 mmol/L (ref 135–145)
TCO2: 28 mmol/L (ref 22–32)

## 2022-04-08 LAB — CBC
HCT: 41.2 % (ref 36.0–46.0)
Hemoglobin: 13.2 g/dL (ref 12.0–15.0)
MCH: 31 pg (ref 26.0–34.0)
MCHC: 32 g/dL (ref 30.0–36.0)
MCV: 96.7 fL (ref 80.0–100.0)
Platelets: 196 10*3/uL (ref 150–400)
RBC: 4.26 MIL/uL (ref 3.87–5.11)
RDW: 12.7 % (ref 11.5–15.5)
WBC: 9.5 10*3/uL (ref 4.0–10.5)
nRBC: 0 % (ref 0.0–0.2)

## 2022-04-08 LAB — RESP PANEL BY RT-PCR (FLU A&B, COVID) ARPGX2
Influenza A by PCR: NEGATIVE
Influenza B by PCR: NEGATIVE
SARS Coronavirus 2 by RT PCR: NEGATIVE

## 2022-04-08 LAB — GLUCOSE, CAPILLARY
Glucose-Capillary: 218 mg/dL — ABNORMAL HIGH (ref 70–99)
Glucose-Capillary: 107 mg/dL — ABNORMAL HIGH (ref 70–99)

## 2022-04-08 MED ORDER — PREDNISONE 20 MG PO TABS
40.0000 mg | ORAL_TABLET | Freq: Every day | ORAL | Status: DC
  Administered 2022-04-08 – 2022-04-09 (×2): 40 mg via ORAL
  Filled 2022-04-08 (×2): qty 2

## 2022-04-08 MED ORDER — BENZONATATE 100 MG PO CAPS
200.0000 mg | ORAL_CAPSULE | Freq: Three times a day (TID) | ORAL | Status: DC | PRN
Start: 2022-04-08 — End: 2022-04-09

## 2022-04-08 MED ORDER — INSULIN ASPART 100 UNIT/ML IJ SOLN: 0.0000 [IU] | Freq: Three times a day (TID) | INTRAMUSCULAR | Status: DC

## 2022-04-08 MED ORDER — ALBUTEROL SULFATE (2.5 MG/3ML) 0.083% IN NEBU
10.0000 mg/h | INHALATION_SOLUTION | Freq: Once | RESPIRATORY_TRACT | Status: AC
  Administered 2022-04-08: 10 mg/h via RESPIRATORY_TRACT
  Filled 2022-04-08: qty 12

## 2022-04-08 MED ORDER — INSULIN ASPART 100 UNIT/ML IJ SOLN
18.0000 [IU] | Freq: Once | INTRAMUSCULAR | Status: AC
Start: 2022-04-08 — End: 2022-04-08
  Administered 2022-04-08: 18 [IU] via SUBCUTANEOUS

## 2022-04-08 MED ORDER — IPRATROPIUM-ALBUTEROL 0.5-2.5 (3) MG/3ML IN SOLN
3.0000 mL | Freq: Four times a day (QID) | RESPIRATORY_TRACT | Status: DC | PRN
Start: 2022-04-08 — End: 2022-04-09

## 2022-04-08 MED ORDER — MOMETASONE FURO-FORMOTEROL FUM 100-5 MCG/ACT IN AERO
2.0000 | INHALATION_SPRAY | Freq: Two times a day (BID) | RESPIRATORY_TRACT | Status: DC
  Administered 2022-04-09: 2 via RESPIRATORY_TRACT
  Filled 2022-04-08: qty 8.8

## 2022-04-08 MED ORDER — KETOROLAC TROMETHAMINE 15 MG/ML IJ SOLN
15.0000 mg | Freq: Once | INTRAMUSCULAR | Status: AC
  Administered 2022-04-08: 15 mg via INTRAVENOUS
  Filled 2022-04-08: qty 1

## 2022-04-08 MED ORDER — UMECLIDINIUM BROMIDE 62.5 MCG/ACT IN AEPB
1.0000 | INHALATION_SPRAY | Freq: Every day | RESPIRATORY_TRACT | Status: DC
Start: 2022-04-09 — End: 2022-04-09
  Administered 2022-04-09: 1 via RESPIRATORY_TRACT
  Filled 2022-04-08: qty 7

## 2022-04-08 MED ORDER — GUAIFENESIN-DM 100-10 MG/5ML PO SYRP
5.0000 mL | ORAL_SOLUTION | ORAL | Status: DC | PRN
Start: 2022-04-08 — End: 2022-04-09

## 2022-04-08 MED ORDER — ACETAMINOPHEN 325 MG PO TABS
650.0000 mg | ORAL_TABLET | Freq: Four times a day (QID) | ORAL | Status: DC | PRN
  Administered 2022-04-08: 650 mg via ORAL
  Filled 2022-04-08: qty 2

## 2022-04-08 MED ORDER — INSULIN GLARGINE-YFGN 100 UNIT/ML ~~LOC~~ SOPN: 20.0000 [IU] | PEN_INJECTOR | Freq: Every day | SUBCUTANEOUS | Status: DC

## 2022-04-08 MED ORDER — AMOXICILLIN-POT CLAVULANATE 875-125 MG PO TABS
1.0000 | ORAL_TABLET | Freq: Two times a day (BID) | ORAL | Status: DC
  Administered 2022-04-08: 1 via ORAL
  Filled 2022-04-08: qty 1

## 2022-04-08 MED ORDER — INSULIN GLARGINE-YFGN 100 UNIT/ML ~~LOC~~ SOLN
25.0000 [IU] | Freq: Every day | SUBCUTANEOUS | Status: DC
  Administered 2022-04-09: 25 [IU] via SUBCUTANEOUS
  Filled 2022-04-08 (×3): qty 0.25

## 2022-04-08 MED ORDER — SODIUM CHLORIDE 0.9 % IV SOLN: INTRAVENOUS | Status: DC

## 2022-04-08 MED ORDER — IOHEXOL 350 MG/ML SOLN
56.0000 mL | Freq: Once | INTRAVENOUS | Status: AC | PRN
  Administered 2022-04-08: 56 mL via INTRAVENOUS

## 2022-04-08 MED ORDER — MAGNESIUM SULFATE 2 GM/50ML IV SOLN
2.0000 g | Freq: Once | INTRAVENOUS | Status: AC
  Administered 2022-04-08: 2 g via INTRAVENOUS
  Filled 2022-04-08: qty 50

## 2022-04-08 MED ORDER — IPRATROPIUM-ALBUTEROL 0.5-2.5 (3) MG/3ML IN SOLN
3.0000 mL | Freq: Four times a day (QID) | RESPIRATORY_TRACT | Status: DC
  Administered 2022-04-08 – 2022-04-09 (×3): 3 mL via RESPIRATORY_TRACT
  Filled 2022-04-08 (×3): qty 3

## 2022-04-08 MED ORDER — ESTROGENS CONJUGATED 0.625 MG PO TABS
1.2500 mg | ORAL_TABLET | Freq: Every day | ORAL | Status: DC
  Administered 2022-04-08: 1.25 mg via ORAL
  Filled 2022-04-08 (×2): qty 2

## 2022-04-08 MED ORDER — PANTOPRAZOLE SODIUM 40 MG PO TBEC
40.0000 mg | DELAYED_RELEASE_TABLET | Freq: Every day | ORAL | Status: DC
  Administered 2022-04-08 – 2022-04-09 (×2): 40 mg via ORAL
  Filled 2022-04-08 (×2): qty 1

## 2022-04-08 MED ORDER — LISINOPRIL 5 MG PO TABS
2.5000 mg | ORAL_TABLET | Freq: Every day | ORAL | Status: DC
  Administered 2022-04-08 – 2022-04-09 (×2): 2.5 mg via ORAL
  Filled 2022-04-08 (×2): qty 1

## 2022-04-08 MED ORDER — LIVING WELL WITH DIABETES BOOK
Freq: Once | Status: AC
  Filled 2022-04-08 (×2): qty 1

## 2022-04-08 MED ORDER — BENZONATATE 100 MG PO CAPS
100.0000 mg | ORAL_CAPSULE | Freq: Once | ORAL | Status: DC
  Filled 2022-04-08: qty 1

## 2022-04-08 MED ORDER — ALBUTEROL SULFATE (2.5 MG/3ML) 0.083% IN NEBU: 2.5000 mg | INHALATION_SOLUTION | RESPIRATORY_TRACT | Status: DC | PRN

## 2022-04-08 MED ORDER — BUDESON-GLYCOPYRROL-FORMOTEROL 160-9-4.8 MCG/ACT IN AERO
2.0000 | INHALATION_SPRAY | Freq: Two times a day (BID) | RESPIRATORY_TRACT | Status: DC
Start: 2022-04-08 — End: 2022-04-08

## 2022-04-08 MED ORDER — ATORVASTATIN CALCIUM 10 MG PO TABS
20.0000 mg | ORAL_TABLET | Freq: Every day | ORAL | Status: DC
Start: 2022-04-08 — End: 2022-04-09
  Administered 2022-04-08: 20 mg via ORAL
  Filled 2022-04-08: qty 2

## 2022-04-08 MED ORDER — PROCHLORPERAZINE EDISYLATE 10 MG/2ML IJ SOLN
10.0000 mg | Freq: Once | INTRAMUSCULAR | Status: AC
  Administered 2022-04-08: 10 mg via INTRAVENOUS
  Filled 2022-04-08: qty 2

## 2022-04-08 MED ORDER — GLIPIZIDE 5 MG PO TABS
5.0000 mg | ORAL_TABLET | Freq: Every day | ORAL | Status: DC
  Administered 2022-04-08 – 2022-04-09 (×2): 5 mg via ORAL
  Filled 2022-04-08 (×2): qty 1

## 2022-04-08 MED ORDER — LORAZEPAM 0.5 MG PO TABS
0.5000 mg | ORAL_TABLET | Freq: Every day | ORAL | Status: DC | PRN
Start: 2022-04-08 — End: 2022-04-09
  Administered 2022-04-08: 0.5 mg via ORAL
  Filled 2022-04-08: qty 1

## 2022-04-08 MED ORDER — ENOXAPARIN SODIUM 40 MG/0.4ML IJ SOSY
40.0000 mg | PREFILLED_SYRINGE | INTRAMUSCULAR | Status: DC
  Administered 2022-04-08 – 2022-04-09 (×2): 40 mg via SUBCUTANEOUS
  Filled 2022-04-08 (×2): qty 0.4

## 2022-04-08 MED ORDER — SERTRALINE HCL 100 MG PO TABS
100.0000 mg | ORAL_TABLET | Freq: Every day | ORAL | Status: DC
  Administered 2022-04-08 – 2022-04-09 (×2): 100 mg via ORAL
  Filled 2022-04-08 (×2): qty 1

## 2022-04-08 MED ORDER — INSULIN ASPART 100 UNIT/ML IJ SOLN
0.0000 [IU] | Freq: Three times a day (TID) | INTRAMUSCULAR | Status: DC
  Administered 2022-04-08: 5 [IU] via SUBCUTANEOUS
  Administered 2022-04-09: 13 [IU] via SUBCUTANEOUS

## 2022-04-08 NOTE — Assessment & Plan Note (Addendum)
-   main sxm is dyspnea; no large change in cough; continues to smoke which is likely contributing - d/c augmentin - continue nebs and prednisone - CTA negative for PE

## 2022-04-08 NOTE — Assessment & Plan Note (Addendum)
-   noncompliant with diet at home as well - last A1c 10.7 % on 12/11/21 - continue semglee and SSI - will adjust as needed due to expected hyperglycemia in setting of steroid use

## 2022-04-08 NOTE — Progress Notes (Signed)
Progress Note    Patricia Kaiser   AVW:[id number]  DOB: 1956/10/09  DOA: 04/08/2022     0 PCP: Rebecka Apley, NP  Initial CC: SOB  Hospital Course: Patricia Kaiser is a 66 yo female with PMH COPD, HTN, DMII, tobacco use who presented with worsening dyspnea. She is chronically on 2-3L nocturnal O2. She has been having worsening dyspnea for a couple weeks; mild clear cough relatively unchanged. She continues to smoke and does not follow a diabetic diet. A1c recently uncontrolled as well.  She underwent CTA chest which was negative for PE. No infiltrates or edema noted. She was started on steroids, augmentin, and breathing treatments.   Interval History:  Resting in bed when seen this morning in the ER. Still dyspneic and coughing. Does not feel like she can catch her breath when walking. Continues to smoke and I again explained her need for cessation.  Assessment and Plan: * COPD exacerbation (HCC) - main sxm is dyspnea; no large change in cough; continues to smoke which is likely contributing - d/c augmentin - continue nebs and prednisone - CTA negative for PE - still to dyspneic for discharge; further treatment and monitoring overnight  Uncontrolled type 2 diabetes mellitus with hyperglycemia, with long-term current use of insulin (HCC) - noncompliant with diet at home as well - last A1c 10.7 % on 12/11/21 - continue semglee and SSI - will adjust as needed due to expected hyperglycemia in setting of steroid use  Hypertension - Continue lisinopril   Old records reviewed in assessment of this patient  Antimicrobials: Augmentin 04/08/22 x 1  DVT prophylaxis:  enoxaparin (LOVENOX) injection 40 mg Start: 04/08/22 1000   Code Status:   Code Status: Full Code  Disposition Plan:  Home Friday Status is: Obs  Objective: Blood pressure (!) 118/56, pulse 91, temperature 98 F (36.7 C), temperature source Temporal, resp. rate (!) 24, height 5\' 2"  (1.575 m), weight 47.2 kg,  SpO2 96 %.  Examination:  Physical Exam Constitutional:      General: She is not in acute distress.    Appearance: She is well-developed. She is ill-appearing.  HENT:     Head: Normocephalic and atraumatic.     Mouth/Throat:     Mouth: Mucous membranes are moist.  Eyes:     Extraocular Movements: Extraocular movements intact.  Pulmonary:     Comments: Poor air movement. Mild wheezing appreciated Abdominal:     General: Bowel sounds are normal. There is no distension.     Palpations: Abdomen is soft.     Tenderness: There is no abdominal tenderness.  Musculoskeletal:        General: No swelling. Normal range of motion.     Cervical back: Normal range of motion and neck supple.  Skin:    General: Skin is warm and dry.  Neurological:     General: No focal deficit present.     Mental Status: She is alert.  Psychiatric:        Mood and Affect: Mood normal.        Behavior: Behavior normal.      Consultants:    Procedures:    Data Reviewed: Results for orders placed or performed during the hospital encounter of 04/08/22 (from the past 24 hour(s))  CBC with Differential/Platelet     Status: Abnormal   Collection Time: 04/08/22 12:34 AM  Result Value Ref Range   WBC 13.9 (H) 4.0 - 10.5 K/uL   RBC 4.30 3.87 - 5.11 MIL/uL  Hemoglobin 13.1 12.0 - 15.0 g/dL   HCT 16.1 09.6 - 04.5 %   MCV 99.1 80.0 - 100.0 fL   MCH 30.5 26.0 - 34.0 pg   MCHC 30.8 30.0 - 36.0 g/dL   RDW 40.9 81.1 - 91.4 %   Platelets 199 150 - 400 K/uL   nRBC 0.0 0.0 - 0.2 %   Neutrophils Relative % 72 %   Neutro Abs 9.9 (H) 1.7 - 7.7 K/uL   Lymphocytes Relative 21 %   Lymphs Abs 2.9 0.7 - 4.0 K/uL   Monocytes Relative 6 %   Monocytes Absolute 0.8 0.1 - 1.0 K/uL   Eosinophils Relative 1 %   Eosinophils Absolute 0.2 0.0 - 0.5 K/uL   Basophils Relative 0 %   Basophils Absolute 0.0 0.0 - 0.1 K/uL   Immature Granulocytes 0 %   Abs Immature Granulocytes 0.04 0.00 - 0.07 K/uL  Comprehensive metabolic  panel     Status: Abnormal   Collection Time: 04/08/22 12:34 AM  Result Value Ref Range   Sodium 138 135 - 145 mmol/L   Potassium 3.8 3.5 - 5.1 mmol/L   Chloride 99 98 - 111 mmol/L   CO2 29 22 - 32 mmol/L   Glucose, Bld 271 (H) 70 - 99 mg/dL   BUN 5 (L) 8 - 23 mg/dL   Creatinine, Ser 7.82 0.44 - 1.00 mg/dL   Calcium 8.5 (L) 8.9 - 10.3 mg/dL   Total Protein 5.8 (L) 6.5 - 8.1 g/dL   Albumin 3.1 (L) 3.5 - 5.0 g/dL   AST 34 15 - 41 U/L   ALT 32 0 - 44 U/L   Alkaline Phosphatase 65 38 - 126 U/L   Total Bilirubin 0.3 0.3 - 1.2 mg/dL   GFR, Estimated >95 >62 mL/min   Anion gap 10 5 - 15  Resp Panel by RT-PCR (Flu A&B, Covid) Anterior Nasal Swab     Status: None   Collection Time: 04/08/22  2:30 AM   Specimen: Anterior Nasal Swab  Result Value Ref Range   SARS Coronavirus 2 by RT PCR NEGATIVE NEGATIVE   Influenza A by PCR NEGATIVE NEGATIVE   Influenza B by PCR NEGATIVE NEGATIVE  I-Stat Chem 8, ED     Status: Abnormal   Collection Time: 04/08/22  2:32 AM  Result Value Ref Range   Sodium 136 135 - 145 mmol/L   Potassium 3.9 3.5 - 5.1 mmol/L   Chloride 97 (L) 98 - 111 mmol/L   BUN 5 (L) 8 - 23 mg/dL   Creatinine, Ser 1.30 (L) 0.44 - 1.00 mg/dL   Glucose, Bld 865 (H) 70 - 99 mg/dL   Calcium, Ion 7.84 (L) 1.15 - 1.40 mmol/L   TCO2 28 22 - 32 mmol/L   Hemoglobin 14.3 12.0 - 15.0 g/dL   HCT 69.6 29.5 - 28.4 %  Respiratory (~20 pathogens) panel by PCR     Status: None   Collection Time: 04/08/22  4:53 AM   Specimen: Nasopharyngeal Swab; Respiratory  Result Value Ref Range   Adenovirus NOT DETECTED NOT DETECTED   Coronavirus 229E NOT DETECTED NOT DETECTED   Coronavirus HKU1 NOT DETECTED NOT DETECTED   Coronavirus NL63 NOT DETECTED NOT DETECTED   Coronavirus OC43 NOT DETECTED NOT DETECTED   Metapneumovirus NOT DETECTED NOT DETECTED   Rhinovirus / Enterovirus NOT DETECTED NOT DETECTED   Influenza A NOT DETECTED NOT DETECTED   Influenza B NOT DETECTED NOT DETECTED   Parainfluenza  Virus 1 NOT DETECTED  NOT DETECTED   Parainfluenza Virus 2 NOT DETECTED NOT DETECTED   Parainfluenza Virus 3 NOT DETECTED NOT DETECTED   Parainfluenza Virus 4 NOT DETECTED NOT DETECTED   Respiratory Syncytial Virus NOT DETECTED NOT DETECTED   Bordetella pertussis NOT DETECTED NOT DETECTED   Bordetella Parapertussis NOT DETECTED NOT DETECTED   Chlamydophila pneumoniae NOT DETECTED NOT DETECTED   Mycoplasma pneumoniae NOT DETECTED NOT DETECTED  CBC     Status: None   Collection Time: 04/08/22  5:30 AM  Result Value Ref Range   WBC 9.5 4.0 - 10.5 K/uL   RBC 4.26 3.87 - 5.11 MIL/uL   Hemoglobin 13.2 12.0 - 15.0 g/dL   HCT 85.0 27.7 - 41.2 %   MCV 96.7 80.0 - 100.0 fL   MCH 31.0 26.0 - 34.0 pg   MCHC 32.0 30.0 - 36.0 g/dL   RDW 87.8 67.6 - 72.0 %   Platelets 196 150 - 400 K/uL   nRBC 0.0 0.0 - 0.2 %  Basic metabolic panel     Status: Abnormal   Collection Time: 04/08/22  5:30 AM  Result Value Ref Range   Sodium 138 135 - 145 mmol/L   Potassium 3.8 3.5 - 5.1 mmol/L   Chloride 99 98 - 111 mmol/L   CO2 25 22 - 32 mmol/L   Glucose, Bld 401 (H) 70 - 99 mg/dL   BUN 7 (L) 8 - 23 mg/dL   Creatinine, Ser 9.47 0.44 - 1.00 mg/dL   Calcium 8.5 (L) 8.9 - 10.3 mg/dL   GFR, Estimated >09 >62 mL/min   Anion gap 14 5 - 15  CBG monitoring, ED     Status: Abnormal   Collection Time: 04/08/22  7:38 AM  Result Value Ref Range   Glucose-Capillary 443 (H) 70 - 99 mg/dL  CBG monitoring, ED     Status: Abnormal   Collection Time: 04/08/22  9:05 AM  Result Value Ref Range   Glucose-Capillary 446 (H) 70 - 99 mg/dL  CBG monitoring, ED     Status: Abnormal   Collection Time: 04/08/22 10:13 AM  Result Value Ref Range   Glucose-Capillary 358 (H) 70 - 99 mg/dL  CBG monitoring, ED     Status: Abnormal   Collection Time: 04/08/22  1:05 PM  Result Value Ref Range   Glucose-Capillary 119 (H) 70 - 99 mg/dL    I have Reviewed nursing notes, Vitals, and Lab results since pt's last encounter. Pertinent lab  results : see above I have ordered test including BMP, CBC, Mg I have reviewed the last note from staff over past 24 hours I have discussed pt's care plan and test results with nursing staff, case manager   LOS: 0 days   Lewie Chamber, MD [facility] 04/08/2022, 1:21 PM

## 2022-04-08 NOTE — Inpatient Diabetes Management (Signed)
Inpatient Diabetes Program Recommendations  AACE/ADA: New Consensus Statement on Inpatient Glycemic Control (2015)  Target Ranges:  Prepandial:   less than 140 mg/dL      Peak postprandial:   less than 180 mg/dL (1-2 hours)      Critically ill patients:  140 - 180 mg/dL   Lab Results  Component Value Date   GLUCAP 119 (H) 04/08/2022   HGBA1C 10.7 (H) 12/11/2021    Review of Glycemic Control  Diabetes history: DM2 Outpatient Diabetes medications: Semglee 20 units QD, Glipizide  Current orders for Inpatient glycemic control: Semglee 25 units QD, Novolog 0-15 units TID and HS, Prednisone 40 mg QAM   Spoke with patient at bedside.  Reviewed patient's current A1c of 10.7% (average blood glucose of 260 mg/dL). Explained what a A1c is and what it measures. Also reviewed goal A1c with patient, importance of good glucose control @ home, and blood sugar goals.  She states she has been on steroids since January for COPD.  Discussed importance of limiting CHO's and eliminating beverages with sugar. She does drink sweet tea often.  She is current with her PCP.  Checks her BG occasionally.    She states she paid $200 for her Semglee last time she picked it up at the pharmacy.  Asked Trey Paula with pharmacy for a benefit check.  Semglee is preferred.  She did have a $1000 deductible which could be the reason it was so expensive.    May need increased insulin dosing while on steroids.  Ordered LWWD booklet.    Will continue to follow while inpatient.  Thank you, Dulce Sellar, MSN, CDCES Diabetes Coordinator Inpatient Diabetes Program [phone] (team pager from 8a-5p)

## 2022-04-08 NOTE — Evaluation (Signed)
Occupational Therapy Evaluation Patient Details Name: Patricia Kaiser MRN: [id number] DOB: 1956/05/25 Today's Date: 04/08/2022   History of Present Illness Pt is a 66 y/o female admitted secondary to SOB. Thought to be secondary to COPD exacerbation. PMH includes HTN, tobacco use, DM, COPD on 2L at night and COVID.   Clinical Impression   Pt admitted for concerns listed above. PTA pt reported that she was independent with all ADL's and IADL's, until recently when her O2 needs began increasing. At this time, pt is able to complete all ADL's and functional mobility with no AD or assist, however she is requiring increased time and rest breaks. OT provided education on energy conservation techniques, pt reports understanding. She has no further skilled OT needs and acute OT Will sign off. Recommending Mobility specialist follow up with pt, if she is admitted to a floor with them assigned.      Recommendations for follow up therapy are one component of a multi-disciplinary discharge planning process, led by the attending physician.  Recommendations may be updated based on patient status, additional functional criteria and insurance authorization.   Follow Up Recommendations  No OT follow up    Assistance Recommended at Discharge PRN  Patient can return home with the following Assistance with cooking/housework    Functional Status Assessment  Patient has had a recent decline in their functional status and demonstrates the ability to make significant improvements in function in a reasonable and predictable amount of time.  Equipment Recommendations  None recommended by OT    Recommendations for Other Services       Precautions / Restrictions Precautions Precautions: Fall Precaution Comments: watch O2 Restrictions Weight Bearing Restrictions: No      Mobility Bed Mobility Overal bed mobility: Modified Independent                  Transfers Overall transfer level: Needs  assistance Equipment used: None Transfers: Sit to/from Stand, Bed to chair/wheelchair/BSC Sit to Stand: Supervision Stand pivot transfers: Supervision         General transfer comment: Supervision for safety to stand      Balance Overall balance assessment: Mild deficits observed, not formally tested                                         ADL either performed or assessed with clinical judgement   ADL Overall ADL's : At baseline;Modified independent                                       General ADL Comments: She is able to complete all ADL's with no assist, increased time due to activity tolerance.     Vision Baseline Vision/History: 1 Wears glasses Ability to See in Adequate Light: 0 Adequate Patient Visual Report: No change from baseline Vision Assessment?: No apparent visual deficits     Perception     Praxis      Pertinent Vitals/Pain Pain Assessment Pain Assessment: No/denies pain     Hand Dominance Right   Extremity/Trunk Assessment Upper Extremity Assessment Upper Extremity Assessment: Overall WFL for tasks assessed   Lower Extremity Assessment Lower Extremity Assessment: Defer to PT evaluation   Cervical / Trunk Assessment Cervical / Trunk Assessment: Normal   Communication Communication Communication: No difficulties   Cognition Arousal/Alertness: Awake/alert  Behavior During Therapy: WFL for tasks assessed/performed Overall Cognitive Status: Within Functional Limits for tasks assessed                                       General Comments  VSS on 2L - spO2 is remaining around 97-99% throughout session    Exercises     Shoulder Instructions      Home Living Family/patient expects to be discharged to:: Private residence Living Arrangements: Spouse/significant other Available Help at Discharge: Family;Available PRN/intermittently Type of Home: House Home Access: Stairs to enter [employer] of Steps: 4 Entrance Stairs-Rails: Right;Left;Can reach both Home Layout: One level     Bathroom Shower/Tub: Chief Strategy Officer: Standard     Home Equipment: Agricultural consultant (2 wheels)          Prior Functioning/Environment Prior Level of Function : Independent/Modified Independent                        OT Problem List: Decreased strength;Decreased activity tolerance;Cardiopulmonary status limiting activity      OT Treatment/Interventions:      OT Goals(Current goals can be found in the care plan section) Acute Rehab OT Goals Patient Stated Goal: To breath better OT Goal Formulation: With patient Time For Goal Achievement: 04/08/22 Potential to Achieve Goals: Good  OT Frequency:      Co-evaluation              AM-PAC OT "6 Clicks" Daily Activity     Outcome Measure Help from another person eating meals?: None Help from another person taking care of personal grooming?: None Help from another person toileting, which includes using toliet, bedpan, or urinal?: None Help from another person bathing (including washing, rinsing, drying)?: None Help from another person to put on and taking off regular upper body clothing?: None Help from another person to put on and taking off regular lower body clothing?: None 6 Click Score: 24   End of Session Equipment Utilized During Treatment: Gait belt;Oxygen Nurse Communication: Mobility status  Activity Tolerance: Patient tolerated treatment well Patient left: in bed;with call bell/phone within reach  OT Visit Diagnosis: Muscle weakness (generalized) (M62.81)                Time: 6283-1517 OT Time Calculation (min): 14 min Charges:  OT General Charges $OT Visit: 1 Visit OT Evaluation $OT Eval Moderate Complexity: 1 Mod  Nova Schmuhl H., OTR/L Acute Rehabilitation  Aalijah Lanphere Elane Bing Plume 04/08/2022, 2:37 PM

## 2022-04-08 NOTE — ED Notes (Signed)
Pt saturations on RA with ambulation dropped to 87%

## 2022-04-08 NOTE — Assessment & Plan Note (Addendum)
-   Patient ambulated with nursing on room air with noted desaturation down to 87% - Patient chronically on 2 to 3 L nocturnal oxygen but has lately been wearing it during the day as well - Continue oxygen for now and wean as able - O2 arranged prior to discharge given some desat with exertion; does okay at rest

## 2022-04-08 NOTE — ED Notes (Signed)
Patient transported to X-ray 

## 2022-04-08 NOTE — Evaluation (Signed)
Physical Therapy Evaluation Patient Details Name: Patricia Kaiser MRN: [id number] DOB: 06/10/1956 Today's Date: 04/08/2022  History of Present Illness  Pt is a 66 y/o female admitted secondary to SOB. Thought to be secondary to COPD exacerbation. PMH includes HTN, tobacco use, DM, COPD on 2L at night and COVID.  Clinical Impression  Pt admitted secondary to problem above with deficits below. Pt requiring supervision for transfers this session. SOB noted, however, oxygen sats at 95-96% on 3L. Anticipate pt will progress well and will not require follow up PT. Will continue to follow acutely to maximize functional mobility independence and safety.        Recommendations for follow up therapy are one component of a multi-disciplinary discharge planning process, led by the attending physician.  Recommendations may be updated based on patient status, additional functional criteria and insurance authorization.  Follow Up Recommendations No PT follow up      Assistance Recommended at Discharge Intermittent Supervision/Assistance  Patient can return home with the following  Assistance with cooking/housework    Equipment Recommendations None recommended by PT  Recommendations for Other Services       Functional Status Assessment Patient has had a recent decline in their functional status and demonstrates the ability to make significant improvements in function in a reasonable and predictable amount of time.     Precautions / Restrictions Precautions Precautions: Fall Precaution Comments: watch O2 Restrictions Weight Bearing Restrictions: No      Mobility  Bed Mobility Overal bed mobility: Modified Independent                  Transfers Overall transfer level: Needs assistance Equipment used: None Transfers: Sit to/from Stand, Bed to chair/wheelchair/BSC Sit to Stand: Supervision Stand pivot transfers: Supervision         General transfer comment: Supervision for  safety to stand and transfer to chair in ED. Did not have portable tank, so unable to attempt further mobility. SOB noted and oxygen sats at 95-96% on 3L.    Ambulation/Gait                  Stairs            Wheelchair Mobility    Modified Rankin (Stroke Patients Only)       Balance Overall balance assessment: Mild deficits observed, not formally tested                                           Pertinent Vitals/Pain Pain Assessment Pain Assessment: No/denies pain    Home Living Family/patient expects to be discharged to:: Private residence Living Arrangements: Spouse/significant other Available Help at Discharge: Family;Available PRN/intermittently Type of Home: House Home Access: Stairs to enter Entrance Stairs-Rails: Right;Left;Can reach both Entrance Stairs-Number of Steps: 4   Home Layout: One level Home Equipment: Agricultural consultant (2 wheels)      Prior Function Prior Level of Function : Independent/Modified Independent                     Hand Dominance        Extremity/Trunk Assessment   Upper Extremity Assessment Upper Extremity Assessment: Defer to OT evaluation    Lower Extremity Assessment Lower Extremity Assessment: Generalized weakness    Cervical / Trunk Assessment Cervical / Trunk Assessment: Normal  Communication   Communication: No difficulties  Cognition Arousal/Alertness: Awake/alert Behavior During  Therapy: WFL for tasks assessed/performed Overall Cognitive Status: Within Functional Limits for tasks assessed                                          General Comments      Exercises     Assessment/Plan    PT Assessment Patient needs continued PT services  PT Problem List Cardiopulmonary status limiting activity;Decreased mobility;Decreased activity tolerance       PT Treatment Interventions DME instruction;Gait training;Stair training;Functional mobility  training;Therapeutic exercise;Therapeutic activities;Balance training;Patient/family education    PT Goals (Current goals can be found in the Care Plan section)  Acute Rehab PT Goals Patient Stated Goal: to go home PT Goal Formulation: With patient Time For Goal Achievement: 04/22/22 Potential to Achieve Goals: Good    Frequency Min 2X/week     Co-evaluation               AM-PAC PT "6 Clicks" Mobility  Outcome Measure Help needed turning from your back to your side while in a flat bed without using bedrails?: None Help needed moving from lying on your back to sitting on the side of a flat bed without using bedrails?: None Help needed moving to and from a bed to a chair (including a wheelchair)?: A Little Help needed standing up from a chair using your arms (e.g., wheelchair or bedside chair)?: A Little Help needed to walk in hospital room?: A Little Help needed climbing 3-5 steps with a railing? : A Little 6 Click Score: 20    End of Session Equipment Utilized During Treatment: Oxygen Activity Tolerance: Patient tolerated treatment well Patient left: in chair;with call bell/phone within reach;with family/visitor present (in recliner in ED) Nurse Communication: Mobility status PT Visit Diagnosis: Other abnormalities of gait and mobility (R26.89)    Time: 5597-4163 PT Time Calculation (min) (ACUTE ONLY): 16 min   Charges:   PT Evaluation $PT Eval Low Complexity: 1 Low          Cindee Salt, DPT  Acute Rehabilitation Services  Office: [phone]   Lehman Prom 04/08/2022, 2:15 PM

## 2022-04-08 NOTE — ED Notes (Signed)
Notified md for orders for CBG>400

## 2022-04-08 NOTE — Assessment & Plan Note (Addendum)
Continue lisinopril

## 2022-04-08 NOTE — ED Triage Notes (Signed)
Pt BIB [facility], c/o shortness of breath x 1 month, worsening in the last few days. Pt seen at [facility] recently for same. EMS reports she just finished abx and steroids. Hx COPD, given 125mg  solumedrol, duoneb x 3, and 1L NS. States she wears O2 at night.

## 2022-04-08 NOTE — Progress Notes (Signed)
Progress Note    Patricia Kaiser   IOE:[id number]  DOB: [birth year]  DOA: 04/08/2022     0 PCP: Rebecka Apley, NP  Initial CC: SOB  Hospital Course: [name] is a 66 yo female with PMH COPD, HTN, DMII, tobacco use who presented with worsening dyspnea. She is chronically on 2-3L nocturnal O2. She has been having worsening dyspnea for a couple weeks; mild clear cough relatively unchanged. She continues to smoke and does not follow a diabetic diet. A1c recently uncontrolled as well.  She underwent CTA chest which was negative for PE. No infiltrates or edema noted. She was started on steroids, augmentin, and breathing treatments.   Interval History:  Resting in bed when seen this morning in the ER. Still dyspneic and coughing. Does not feel like she can catch her breath when walking. Continues to smoke and I again explained her need for cessation.  Assessment and Plan: * COPD exacerbation (HCC) - main sxm is dyspnea; no large change in cough; continues to smoke which is likely contributing - d/c augmentin - continue nebs and prednisone - CTA negative for PE - still to dyspneic for discharge; further treatment and monitoring overnight  Uncontrolled type 2 diabetes mellitus with hyperglycemia, with long-term current use of insulin (HCC) - noncompliant with diet at home as well - last A1c 10.7 % on 12/11/21 - continue semglee and SSI - will adjust as needed due to expected hyperglycemia in setting of steroid use  Acute on chronic respiratory failure with hypoxia (HCC) - Patient ambulated with nursing on room air with noted desaturation down to 87% - Patient chronically on 2 to 3 L nocturnal oxygen but has lately been wearing it during the day as well - Continue oxygen for now and wean as able  Hypertension - Continue lisinopril   Old records reviewed in assessment of this patient  Antimicrobials: Augmentin 04/08/22 x 1  DVT prophylaxis:  enoxaparin (LOVENOX) injection  40 mg Start: 04/08/22 1000   Code Status:   Code Status: Full Code  Disposition Plan:  Home Friday Status is: Obs  Objective: Blood pressure (!) 118/56, pulse 91, temperature 98 F (36.7 C), temperature source Temporal, resp. rate (!) 24, height 5\' 2"  (1.575 m), weight 47.2 kg, SpO2 96 %.  Examination:  Physical Exam Constitutional:      General: She is not in acute distress.    Appearance: She is well-developed. She is ill-appearing.  HENT:     Head: Normocephalic and atraumatic.     Mouth/Throat:     Mouth: Mucous membranes are moist.  Eyes:     Extraocular Movements: Extraocular movements intact.  Pulmonary:     Comments: Poor air movement. Mild wheezing appreciated Abdominal:     General: Bowel sounds are normal. There is no distension.     Palpations: Abdomen is soft.     Tenderness: There is no abdominal tenderness.  Musculoskeletal:        General: No swelling. Normal range of motion.     Cervical back: Normal range of motion and neck supple.  Skin:    General: Skin is warm and dry.  Neurological:     General: No focal deficit present.     Mental Status: She is alert.  Psychiatric:        Mood and Affect: Mood normal.        Behavior: Behavior normal.      Consultants:    Procedures:    Data Reviewed: Results for orders  placed or performed during the hospital encounter of 04/08/22 (from the past 24 hour(s))  CBC with Differential/Platelet     Status: Abnormal   Collection Time: 04/08/22 12:34 AM  Result Value Ref Range   WBC 13.9 (H) 4.0 - 10.5 K/uL   RBC 4.30 3.87 - 5.11 MIL/uL   Hemoglobin 13.1 12.0 - 15.0 g/dL   HCT 16.1 09.6 - 04.5 %   MCV 99.1 80.0 - 100.0 fL   MCH 30.5 26.0 - 34.0 pg   MCHC 30.8 30.0 - 36.0 g/dL   RDW 40.9 81.1 - 91.4 %   Platelets 199 150 - 400 K/uL   nRBC 0.0 0.0 - 0.2 %   Neutrophils Relative % 72 %   Neutro Abs 9.9 (H) 1.7 - 7.7 K/uL   Lymphocytes Relative 21 %   Lymphs Abs 2.9 0.7 - 4.0 K/uL   Monocytes Relative 6 %    Monocytes Absolute 0.8 0.1 - 1.0 K/uL   Eosinophils Relative 1 %   Eosinophils Absolute 0.2 0.0 - 0.5 K/uL   Basophils Relative 0 %   Basophils Absolute 0.0 0.0 - 0.1 K/uL   Immature Granulocytes 0 %   Abs Immature Granulocytes 0.04 0.00 - 0.07 K/uL  Comprehensive metabolic panel     Status: Abnormal   Collection Time: 04/08/22 12:34 AM  Result Value Ref Range   Sodium 138 135 - 145 mmol/L   Potassium 3.8 3.5 - 5.1 mmol/L   Chloride 99 98 - 111 mmol/L   CO2 29 22 - 32 mmol/L   Glucose, Bld 271 (H) 70 - 99 mg/dL   BUN 5 (L) 8 - 23 mg/dL   Creatinine, Ser 7.82 0.44 - 1.00 mg/dL   Calcium 8.5 (L) 8.9 - 10.3 mg/dL   Total Protein 5.8 (L) 6.5 - 8.1 g/dL   Albumin 3.1 (L) 3.5 - 5.0 g/dL   AST 34 15 - 41 U/L   ALT 32 0 - 44 U/L   Alkaline Phosphatase 65 38 - 126 U/L   Total Bilirubin 0.3 0.3 - 1.2 mg/dL   GFR, Estimated >95 >62 mL/min   Anion gap 10 5 - 15  Resp Panel by RT-PCR (Flu A&B, Covid) Anterior Nasal Swab     Status: None   Collection Time: 04/08/22  2:30 AM   Specimen: Anterior Nasal Swab  Result Value Ref Range   SARS Coronavirus 2 by RT PCR NEGATIVE NEGATIVE   Influenza A by PCR NEGATIVE NEGATIVE   Influenza B by PCR NEGATIVE NEGATIVE  I-Stat Chem 8, ED     Status: Abnormal   Collection Time: 04/08/22  2:32 AM  Result Value Ref Range   Sodium 136 135 - 145 mmol/L   Potassium 3.9 3.5 - 5.1 mmol/L   Chloride 97 (L) 98 - 111 mmol/L   BUN 5 (L) 8 - 23 mg/dL   Creatinine, Ser 1.30 (L) 0.44 - 1.00 mg/dL   Glucose, Bld 865 (H) 70 - 99 mg/dL   Calcium, Ion 7.84 (L) 1.15 - 1.40 mmol/L   TCO2 28 22 - 32 mmol/L   Hemoglobin 14.3 12.0 - 15.0 g/dL   HCT 69.6 29.5 - 28.4 %  Respiratory (~20 pathogens) panel by PCR     Status: None   Collection Time: 04/08/22  4:53 AM   Specimen: Nasopharyngeal Swab; Respiratory  Result Value Ref Range   Adenovirus NOT DETECTED NOT DETECTED   Coronavirus 229E NOT DETECTED NOT DETECTED   Coronavirus HKU1 NOT DETECTED NOT DETECTED  Coronavirus NL63 NOT DETECTED NOT DETECTED   Coronavirus OC43 NOT DETECTED NOT DETECTED   Metapneumovirus NOT DETECTED NOT DETECTED   Rhinovirus / Enterovirus NOT DETECTED NOT DETECTED   Influenza A NOT DETECTED NOT DETECTED   Influenza B NOT DETECTED NOT DETECTED   Parainfluenza Virus 1 NOT DETECTED NOT DETECTED   Parainfluenza Virus 2 NOT DETECTED NOT DETECTED   Parainfluenza Virus 3 NOT DETECTED NOT DETECTED   Parainfluenza Virus 4 NOT DETECTED NOT DETECTED   Respiratory Syncytial Virus NOT DETECTED NOT DETECTED   Bordetella pertussis NOT DETECTED NOT DETECTED   Bordetella Parapertussis NOT DETECTED NOT DETECTED   Chlamydophila pneumoniae NOT DETECTED NOT DETECTED   Mycoplasma pneumoniae NOT DETECTED NOT DETECTED  CBC     Status: None   Collection Time: 04/08/22  5:30 AM  Result Value Ref Range   WBC 9.5 4.0 - 10.5 K/uL   RBC 4.26 3.87 - 5.11 MIL/uL   Hemoglobin 13.2 12.0 - 15.0 g/dL   HCT 24.5 80.9 - 98.3 %   MCV 96.7 80.0 - 100.0 fL   MCH 31.0 26.0 - 34.0 pg   MCHC 32.0 30.0 - 36.0 g/dL   RDW 38.2 50.5 - 39.7 %   Platelets 196 150 - 400 K/uL   nRBC 0.0 0.0 - 0.2 %  Basic metabolic panel     Status: Abnormal   Collection Time: 04/08/22  5:30 AM  Result Value Ref Range   Sodium 138 135 - 145 mmol/L   Potassium 3.8 3.5 - 5.1 mmol/L   Chloride 99 98 - 111 mmol/L   CO2 25 22 - 32 mmol/L   Glucose, Bld 401 (H) 70 - 99 mg/dL   BUN 7 (L) 8 - 23 mg/dL   Creatinine, Ser 6.73 0.44 - 1.00 mg/dL   Calcium 8.5 (L) 8.9 - 10.3 mg/dL   GFR, Estimated >41 >93 mL/min   Anion gap 14 5 - 15  CBG monitoring, ED     Status: Abnormal   Collection Time: 04/08/22  7:38 AM  Result Value Ref Range   Glucose-Capillary 443 (H) 70 - 99 mg/dL  CBG monitoring, ED     Status: Abnormal   Collection Time: 04/08/22  9:05 AM  Result Value Ref Range   Glucose-Capillary 446 (H) 70 - 99 mg/dL  CBG monitoring, ED     Status: Abnormal   Collection Time: 04/08/22 10:13 AM  Result Value Ref Range    Glucose-Capillary 358 (H) 70 - 99 mg/dL  CBG monitoring, ED     Status: Abnormal   Collection Time: 04/08/22  1:05 PM  Result Value Ref Range   Glucose-Capillary 119 (H) 70 - 99 mg/dL    I have Reviewed nursing notes, Vitals, and Lab results since pt's last encounter. Pertinent lab results : see above I have ordered test including BMP, CBC, Mg I have reviewed the last note from staff over past 24 hours I have discussed pt's care plan and test results with nursing staff, case manager   LOS: 0 days   Lewie Chamber, MD [facility] 04/08/2022, 3:28 PM

## 2022-04-08 NOTE — Hospital Course (Signed)
Ms. Ghanem is a 66 yo female with PMH COPD, HTN, DMII, tobacco use who presented with worsening dyspnea. She is chronically on 2-3L nocturnal O2. She has been having worsening dyspnea for a couple weeks; mild clear cough relatively unchanged. She continues to smoke and does not follow a diabetic diet. A1c recently uncontrolled as well.  She underwent CTA chest which was negative for PE. No infiltrates or edema noted. She was started on steroids, augmentin, and breathing treatments.

## 2022-04-09 DIAGNOSIS — J441 Chronic obstructive pulmonary disease with (acute) exacerbation: Secondary | ICD-10-CM | POA: Diagnosis not present

## 2022-04-09 DIAGNOSIS — J9621 Acute and chronic respiratory failure with hypoxia: Secondary | ICD-10-CM

## 2022-04-09 LAB — CBC WITH DIFFERENTIAL/PLATELET
Abs Immature Granulocytes: 0.05 10*3/uL (ref 0.00–0.07)
Basophils Absolute: 0 10*3/uL (ref 0.0–0.1)
Basophils Relative: 0 %
Eosinophils Absolute: 0.1 10*3/uL (ref 0.0–0.5)
Eosinophils Relative: 0 %
HCT: 35.1 % — ABNORMAL LOW (ref 36.0–46.0)
Hemoglobin: 11.5 g/dL — ABNORMAL LOW (ref 12.0–15.0)
Immature Granulocytes: 0 %
Lymphocytes Relative: 21 %
Lymphs Abs: 3.2 10*3/uL (ref 0.7–4.0)
MCH: 30.9 pg (ref 26.0–34.0)
MCHC: 32.8 g/dL (ref 30.0–36.0)
MCV: 94.4 fL (ref 80.0–100.0)
Monocytes Absolute: 1.1 10*3/uL — ABNORMAL HIGH (ref 0.1–1.0)
Monocytes Relative: 8 %
Neutro Abs: 10.5 10*3/uL — ABNORMAL HIGH (ref 1.7–7.7)
Neutrophils Relative %: 71 %
Platelets: 214 10*3/uL (ref 150–400)
RBC: 3.72 MIL/uL — ABNORMAL LOW (ref 3.87–5.11)
RDW: 12.8 % (ref 11.5–15.5)
WBC: 15 10*3/uL — ABNORMAL HIGH (ref 4.0–10.5)
nRBC: 0 % (ref 0.0–0.2)

## 2022-04-09 LAB — BASIC METABOLIC PANEL
Anion gap: 5 (ref 5–15)
BUN: 9 mg/dL (ref 8–23)
CO2: 30 mmol/L (ref 22–32)
Calcium: 8.3 mg/dL — ABNORMAL LOW (ref 8.9–10.3)
Chloride: 104 mmol/L (ref 98–111)
Creatinine, Ser: 0.57 mg/dL (ref 0.44–1.00)
GFR, Estimated: >60 mL/min (ref >60)
Glucose, Bld: 151 mg/dL — ABNORMAL HIGH (ref 70–99)
Potassium: 3.9 mmol/L (ref 3.5–5.1)
Sodium: 139 mmol/L (ref 135–145)

## 2022-04-09 LAB — GLUCOSE, CAPILLARY
Glucose-Capillary: 332 mg/dL — ABNORMAL HIGH (ref 70–99)
Glucose-Capillary: 88 mg/dL (ref 70–99)

## 2022-04-09 LAB — MAGNESIUM: Magnesium: 2 mg/dL (ref 1.7–2.4)

## 2022-04-09 LAB — HIV ANTIBODY (ROUTINE TESTING W REFLEX): HIV Screen 4th Generation wRfx: NONREACTIVE

## 2022-04-09 MED ORDER — PREDNISONE 20 MG PO TABS: 40.0000 mg | ORAL_TABLET | Freq: Every day | ORAL | 0 refills | Status: AC

## 2022-04-09 MED ORDER — FLUCONAZOLE 150 MG PO TABS
150.0000 mg | ORAL_TABLET | Freq: Once | ORAL | Status: AC
  Administered 2022-04-09: 150 mg via ORAL
  Filled 2022-04-09: qty 1

## 2022-04-09 MED ORDER — IPRATROPIUM-ALBUTEROL 0.5-2.5 (3) MG/3ML IN SOLN
3.0000 mL | Freq: Two times a day (BID) | RESPIRATORY_TRACT | Status: DC
Start: 2022-04-09 — End: 2022-04-09
  Administered 2022-04-09: 3 mL via RESPIRATORY_TRACT
  Filled 2022-04-09: qty 3

## 2022-04-09 MED ORDER — IPRATROPIUM-ALBUTEROL 0.5-2.5 (3) MG/3ML IN SOLN: 3.0000 mL | Freq: Two times a day (BID) | RESPIRATORY_TRACT | Status: DC

## 2022-04-09 NOTE — Progress Notes (Signed)
Pt worked with PT prior to discharge and PT recommending home oxygen.  Case Management stating that pt will need to have portable oxygen delivered to room prior to discharge.  Pt and significant other notified and verbalized understanding.

## 2022-04-09 NOTE — Progress Notes (Signed)
Pt ordered to discharge home. AVS reviewed with pt. Education provided as needed. Patient verbalized understanding. All questions answered.  

## 2022-04-09 NOTE — Progress Notes (Signed)
Oxygen delivered to room - pt to discharge home.

## 2022-09-11 LAB — COLOGUARD: COLOGUARD: NEGATIVE

## 2022-09-11 LAB — EXTERNAL GENERIC LAB PROCEDURE: COLOGUARD: NEGATIVE

## 2023-10-13 ENCOUNTER — Other Ambulatory Visit: Payer: Self-pay

## 2023-10-13 ENCOUNTER — Emergency Department (HOSPITAL_COMMUNITY): Payer: Medicare Other

## 2023-10-13 ENCOUNTER — Encounter (HOSPITAL_COMMUNITY): Payer: Self-pay

## 2023-10-13 ENCOUNTER — Inpatient Hospital Stay (HOSPITAL_COMMUNITY)
Admission: EM | Admit: 2023-10-13 | Discharge: 2023-10-16 | DRG: 189 | Disposition: A | Discharge status: Home / Self Care | Payer: Medicare Other | Attending: Internal Medicine | Admitting: Internal Medicine

## 2023-10-13 DIAGNOSIS — Z7984 Long term (current) use of oral hypoglycemic drugs: Secondary | ICD-10-CM

## 2023-10-13 DIAGNOSIS — Z83438 Family history of other disorder of lipoprotein metabolism and other lipidemia: Secondary | ICD-10-CM | POA: Diagnosis not present

## 2023-10-13 DIAGNOSIS — Z1152 Encounter for screening for COVID-19: Secondary | ICD-10-CM

## 2023-10-13 DIAGNOSIS — Z825 Family history of asthma and other chronic lower respiratory diseases: Secondary | ICD-10-CM | POA: Diagnosis not present

## 2023-10-13 DIAGNOSIS — R252 Cramp and spasm: Secondary | ICD-10-CM | POA: Diagnosis not present

## 2023-10-13 DIAGNOSIS — K1379 Other lesions of oral mucosa: Secondary | ICD-10-CM | POA: Diagnosis present

## 2023-10-13 DIAGNOSIS — J449 Chronic obstructive pulmonary disease, unspecified: Secondary | ICD-10-CM | POA: Diagnosis not present

## 2023-10-13 DIAGNOSIS — Z79899 Other long term (current) drug therapy: Secondary | ICD-10-CM | POA: Diagnosis not present

## 2023-10-13 DIAGNOSIS — F40240 Claustrophobia: Secondary | ICD-10-CM | POA: Diagnosis present

## 2023-10-13 DIAGNOSIS — Z833 Family history of diabetes mellitus: Secondary | ICD-10-CM

## 2023-10-13 DIAGNOSIS — E1165 Type 2 diabetes mellitus with hyperglycemia: Secondary | ICD-10-CM

## 2023-10-13 DIAGNOSIS — Z823 Family history of stroke: Secondary | ICD-10-CM | POA: Diagnosis not present

## 2023-10-13 DIAGNOSIS — Z7951 Long term (current) use of inhaled steroids: Secondary | ICD-10-CM | POA: Diagnosis not present

## 2023-10-13 DIAGNOSIS — Z8249 Family history of ischemic heart disease and other diseases of the circulatory system: Secondary | ICD-10-CM

## 2023-10-13 DIAGNOSIS — Z888 Allergy status to other drugs, medicaments and biological substances status: Secondary | ICD-10-CM | POA: Diagnosis not present

## 2023-10-13 DIAGNOSIS — I10 Essential (primary) hypertension: Secondary | ICD-10-CM | POA: Diagnosis present

## 2023-10-13 DIAGNOSIS — Z794 Long term (current) use of insulin: Secondary | ICD-10-CM | POA: Diagnosis not present

## 2023-10-13 DIAGNOSIS — Z882 Allergy status to sulfonamides status: Secondary | ICD-10-CM | POA: Diagnosis not present

## 2023-10-13 DIAGNOSIS — M79606 Pain in leg, unspecified: Secondary | ICD-10-CM | POA: Diagnosis not present

## 2023-10-13 DIAGNOSIS — J9621 Acute and chronic respiratory failure with hypoxia: Principal | ICD-10-CM | POA: Diagnosis present

## 2023-10-13 DIAGNOSIS — B348 Other viral infections of unspecified site: Secondary | ICD-10-CM | POA: Insufficient documentation

## 2023-10-13 DIAGNOSIS — J441 Chronic obstructive pulmonary disease with (acute) exacerbation: Secondary | ICD-10-CM | POA: Diagnosis present

## 2023-10-13 DIAGNOSIS — Z9981 Dependence on supplemental oxygen: Secondary | ICD-10-CM

## 2023-10-13 DIAGNOSIS — E119 Type 2 diabetes mellitus without complications: Secondary | ICD-10-CM | POA: Diagnosis present

## 2023-10-13 DIAGNOSIS — E874 Mixed disorder of acid-base balance: Secondary | ICD-10-CM | POA: Diagnosis present

## 2023-10-13 DIAGNOSIS — B9789 Other viral agents as the cause of diseases classified elsewhere: Secondary | ICD-10-CM | POA: Diagnosis present

## 2023-10-13 DIAGNOSIS — F17210 Nicotine dependence, cigarettes, uncomplicated: Secondary | ICD-10-CM | POA: Diagnosis present

## 2023-10-13 LAB — CBC WITH DIFFERENTIAL/PLATELET
Abs Immature Granulocytes: 0.04 10*3/uL (ref 0.00–0.07)
Basophils Absolute: 0.1 10*3/uL (ref 0.0–0.1)
Basophils Relative: 0 %
Eosinophils Absolute: 0.2 10*3/uL (ref 0.0–0.5)
Eosinophils Relative: 2 %
HCT: 42.8 % (ref 36.0–46.0)
Hemoglobin: 13 g/dL (ref 12.0–15.0)
Immature Granulocytes: 0 %
Lymphocytes Relative: 25 %
Lymphs Abs: 2.9 10*3/uL (ref 0.7–4.0)
MCH: 29.5 pg (ref 26.0–34.0)
MCHC: 30.4 g/dL (ref 30.0–36.0)
MCV: 97.3 fL (ref 80.0–100.0)
Monocytes Absolute: 0.8 10*3/uL (ref 0.1–1.0)
Monocytes Relative: 7 %
Neutro Abs: 7.5 10*3/uL (ref 1.7–7.7)
Neutrophils Relative %: 66 %
Platelets: 246 10*3/uL (ref 150–400)
RBC: 4.4 MIL/uL (ref 3.87–5.11)
RDW: 14.8 % (ref 11.5–15.5)
WBC: 11.4 10*3/uL — ABNORMAL HIGH (ref 4.0–10.5)
nRBC: 0 % (ref 0.0–0.2)

## 2023-10-13 LAB — SARS CORONAVIRUS 2 BY RT PCR: SARS Coronavirus 2 by RT PCR: NEGATIVE

## 2023-10-13 LAB — BRAIN NATRIURETIC PEPTIDE: B Natriuretic Peptide: 67.3 pg/mL (ref 0.0–100.0)

## 2023-10-13 LAB — GLUCOSE, CAPILLARY: Glucose-Capillary: 273 mg/dL — ABNORMAL HIGH (ref 70–99)

## 2023-10-13 LAB — TROPONIN I (HIGH SENSITIVITY): Troponin I (High Sensitivity): 3 ng/L (ref <18)

## 2023-10-13 LAB — BLOOD GAS, VENOUS
Acid-Base Excess: 4.3 mmol/L — ABNORMAL HIGH (ref 0.0–2.0)
Bicarbonate: 35.6 mmol/L — ABNORMAL HIGH (ref 20.0–28.0)
Drawn by: 7975
O2 Saturation: 74.6 %
Patient temperature: 37
pCO2, Ven: 91 mm[Hg] (ref 44–60)
pH, Ven: 7.2 — ABNORMAL LOW (ref 7.25–7.43)
pO2, Ven: 47 mm[Hg] — ABNORMAL HIGH (ref 32–45)

## 2023-10-13 LAB — BASIC METABOLIC PANEL
Anion gap: 6 (ref 5–15)
BUN: 8 mg/dL (ref 8–23)
CO2: 34 mmol/L — ABNORMAL HIGH (ref 22–32)
Calcium: 8.7 mg/dL — ABNORMAL LOW (ref 8.9–10.3)
Chloride: 96 mmol/L — ABNORMAL LOW (ref 98–111)
Creatinine, Ser: 0.49 mg/dL (ref 0.44–1.00)
GFR, Estimated: >60 mL/min (ref >60)
Glucose, Bld: 222 mg/dL — ABNORMAL HIGH (ref 70–99)
Potassium: 4 mmol/L (ref 3.5–5.1)
Sodium: 136 mmol/L (ref 135–145)

## 2023-10-13 LAB — D-DIMER, QUANTITATIVE: D-Dimer, Quant: <0.27 ug{FEU}/mL (ref 0.00–0.50)

## 2023-10-13 LAB — CBG MONITORING, ED: Glucose-Capillary: 249 mg/dL — ABNORMAL HIGH (ref 70–99)

## 2023-10-13 LAB — CREATININE, SERUM
Creatinine, Ser: 0.58 mg/dL (ref 0.44–1.00)
GFR, Estimated: >60 mL/min (ref >60)

## 2023-10-13 LAB — CBC
HCT: 41.9 % (ref 36.0–46.0)
Hemoglobin: 13.2 g/dL (ref 12.0–15.0)
MCH: 30.5 pg (ref 26.0–34.0)
MCHC: 31.5 g/dL (ref 30.0–36.0)
MCV: 96.8 fL (ref 80.0–100.0)
Platelets: 260 10*3/uL (ref 150–400)
RBC: 4.33 MIL/uL (ref 3.87–5.11)
RDW: 14.6 % (ref 11.5–15.5)
WBC: 14 10*3/uL — ABNORMAL HIGH (ref 4.0–10.5)
nRBC: 0 % (ref 0.0–0.2)

## 2023-10-13 MED ORDER — METHYLPREDNISOLONE SODIUM SUCC 125 MG IJ SOLR
125.0000 mg | Freq: Once | INTRAMUSCULAR | Status: AC
Start: 2023-10-13 — End: 2023-10-13
  Administered 2023-10-13: 125 mg via INTRAVENOUS
  Filled 2023-10-13: qty 2

## 2023-10-13 MED ORDER — ALBUTEROL SULFATE (2.5 MG/3ML) 0.083% IN NEBU
2.5000 mg | INHALATION_SOLUTION | Freq: Four times a day (QID) | RESPIRATORY_TRACT | Status: DC | PRN
  Administered 2023-10-14 – 2023-10-16 (×4): 2.5 mg via RESPIRATORY_TRACT
  Filled 2023-10-13 (×4): qty 3

## 2023-10-13 MED ORDER — LORAZEPAM 2 MG/ML IJ SOLN
0.5000 mg | Freq: Four times a day (QID) | INTRAMUSCULAR | Status: AC | PRN
  Administered 2023-10-14 (×2): 0.5 mg via INTRAVENOUS
  Filled 2023-10-13 (×2): qty 1

## 2023-10-13 MED ORDER — SODIUM CHLORIDE 0.9% FLUSH
3.0000 mL | Freq: Two times a day (BID) | INTRAVENOUS | Status: DC
  Administered 2023-10-13 – 2023-10-16 (×6): 3 mL via INTRAVENOUS

## 2023-10-13 MED ORDER — ATORVASTATIN CALCIUM 20 MG PO TABS
20.0000 mg | ORAL_TABLET | Freq: Every day | ORAL | Status: DC
  Administered 2023-10-13 – 2023-10-15 (×3): 20 mg via ORAL
  Filled 2023-10-13: qty 1
  Filled 2023-10-13: qty 2
  Filled 2023-10-13: qty 1

## 2023-10-13 MED ORDER — INSULIN ASPART 100 UNIT/ML IJ SOLN
0.0000 [IU] | Freq: Every day | INTRAMUSCULAR | Status: DC
  Administered 2023-10-13 – 2023-10-14 (×2): 3 [IU] via SUBCUTANEOUS
  Administered 2023-10-15: 4 [IU] via SUBCUTANEOUS
  Filled 2023-10-13: qty 0.05

## 2023-10-13 MED ORDER — LISINOPRIL 5 MG PO TABS
2.5000 mg | ORAL_TABLET | Freq: Every day | ORAL | Status: DC
  Administered 2023-10-14 – 2023-10-16 (×3): 2.5 mg via ORAL
  Filled 2023-10-13 (×3): qty 1

## 2023-10-13 MED ORDER — UMECLIDINIUM BROMIDE 62.5 MCG/ACT IN AEPB
1.0000 | INHALATION_SPRAY | Freq: Every day | RESPIRATORY_TRACT | Status: DC
  Administered 2023-10-15 – 2023-10-16 (×2): 1 via RESPIRATORY_TRACT
  Filled 2023-10-13 (×3): qty 7

## 2023-10-13 MED ORDER — POLYETHYLENE GLYCOL 3350 17 G PO PACK: 17.0000 g | PACK | Freq: Every day | ORAL | Status: DC | PRN

## 2023-10-13 MED ORDER — INSULIN ASPART 100 UNIT/ML IJ SOLN
0.0000 [IU] | Freq: Three times a day (TID) | INTRAMUSCULAR | Status: DC
  Administered 2023-10-14: 9 [IU] via SUBCUTANEOUS
  Administered 2023-10-14: 7 [IU] via SUBCUTANEOUS
  Administered 2023-10-14 – 2023-10-15 (×2): 9 [IU] via SUBCUTANEOUS
  Filled 2023-10-13: qty 0.09

## 2023-10-13 MED ORDER — SODIUM CHLORIDE 0.9 % IV SOLN
2.0000 g | Freq: Two times a day (BID) | INTRAVENOUS | Status: DC
  Administered 2023-10-13 – 2023-10-14 (×2): 2 g via INTRAVENOUS
  Filled 2023-10-13 (×2): qty 12.5

## 2023-10-13 MED ORDER — INSULIN GLARGINE-YFGN 100 UNIT/ML ~~LOC~~ SOLN
25.0000 [IU] | Freq: Every day | SUBCUTANEOUS | Status: DC
  Administered 2023-10-14 – 2023-10-15 (×2): 25 [IU] via SUBCUTANEOUS
  Filled 2023-10-13 (×3): qty 0.25

## 2023-10-13 MED ORDER — FLUTICASONE FUROATE-VILANTEROL 100-25 MCG/ACT IN AEPB
1.0000 | INHALATION_SPRAY | Freq: Every day | RESPIRATORY_TRACT | Status: DC
  Administered 2023-10-15 – 2023-10-16 (×2): 1 via RESPIRATORY_TRACT
  Filled 2023-10-13 (×3): qty 28

## 2023-10-13 MED ORDER — CHLORHEXIDINE GLUCONATE CLOTH 2 % EX PADS
6.0000 | MEDICATED_PAD | Freq: Every day | CUTANEOUS | Status: DC
  Administered 2023-10-14 – 2023-10-16 (×3): 6 via TOPICAL

## 2023-10-13 MED ORDER — ADULT MULTIVITAMIN W/MINERALS CH
1.0000 | ORAL_TABLET | Freq: Every day | ORAL | Status: DC
  Administered 2023-10-13 – 2023-10-15 (×3): 1 via ORAL
  Filled 2023-10-13 (×3): qty 1

## 2023-10-13 MED ORDER — ACETAMINOPHEN 650 MG RE SUPP: 650.0000 mg | Freq: Four times a day (QID) | RECTAL | Status: DC | PRN

## 2023-10-13 MED ORDER — MAGIC MOUTHWASH W/LIDOCAINE
5.0000 mL | Freq: Four times a day (QID) | ORAL | Status: DC | PRN
  Administered 2023-10-14: 5 mL via ORAL
  Filled 2023-10-13 (×2): qty 5

## 2023-10-13 MED ORDER — HYDROCODONE BIT-HOMATROP MBR 5-1.5 MG/5ML PO SOLN
5.0000 mL | Freq: Four times a day (QID) | ORAL | Status: DC | PRN
  Administered 2023-10-14 – 2023-10-16 (×5): 5 mL via ORAL
  Filled 2023-10-13 (×5): qty 5

## 2023-10-13 MED ORDER — ACETAMINOPHEN 325 MG PO TABS
650.0000 mg | ORAL_TABLET | Freq: Four times a day (QID) | ORAL | Status: DC | PRN
  Administered 2023-10-14: 650 mg via ORAL
  Filled 2023-10-13: qty 2

## 2023-10-13 MED ORDER — IPRATROPIUM-ALBUTEROL 0.5-2.5 (3) MG/3ML IN SOLN
3.0000 mL | RESPIRATORY_TRACT | Status: AC
  Administered 2023-10-13 (×2): 3 mL via RESPIRATORY_TRACT
  Filled 2023-10-13 (×2): qty 3

## 2023-10-13 MED ORDER — IPRATROPIUM-ALBUTEROL 0.5-2.5 (3) MG/3ML IN SOLN
3.0000 mL | Freq: Four times a day (QID) | RESPIRATORY_TRACT | Status: DC
  Administered 2023-10-13 – 2023-10-14 (×2): 3 mL via RESPIRATORY_TRACT
  Filled 2023-10-13 (×2): qty 3

## 2023-10-13 MED ORDER — METHYLPREDNISOLONE SODIUM SUCC 125 MG IJ SOLR
60.0000 mg | INTRAMUSCULAR | Status: DC
Start: 2023-10-14 — End: 2023-10-16
  Administered 2023-10-14 – 2023-10-16 (×3): 60 mg via INTRAVENOUS
  Filled 2023-10-13 (×3): qty 2

## 2023-10-13 MED ORDER — DOXYCYCLINE HYCLATE 100 MG IV SOLR
100.0000 mg | Freq: Two times a day (BID) | INTRAVENOUS | Status: DC
  Administered 2023-10-13: 100 mg via INTRAVENOUS
  Filled 2023-10-13 (×2): qty 100

## 2023-10-13 MED ORDER — ENOXAPARIN SODIUM 40 MG/0.4ML IJ SOSY
40.0000 mg | PREFILLED_SYRINGE | INTRAMUSCULAR | Status: DC
  Administered 2023-10-14 – 2023-10-16 (×2): 40 mg via SUBCUTANEOUS
  Filled 2023-10-13 (×3): qty 0.4

## 2023-10-13 MED ORDER — GUAIFENESIN ER 600 MG PO TB12
600.0000 mg | ORAL_TABLET | Freq: Two times a day (BID) | ORAL | Status: DC
Start: 2023-10-13 — End: 2023-10-16
  Administered 2023-10-13 – 2023-10-16 (×6): 600 mg via ORAL
  Filled 2023-10-13 (×6): qty 1

## 2023-10-13 MED ORDER — PANTOPRAZOLE SODIUM 20 MG PO TBEC
20.0000 mg | DELAYED_RELEASE_TABLET | Freq: Every day | ORAL | Status: DC
Start: 2023-10-13 — End: 2023-10-16
  Administered 2023-10-13 – 2023-10-16 (×4): 20 mg via ORAL
  Filled 2023-10-13 (×4): qty 1

## 2023-10-13 NOTE — Assessment & Plan Note (Signed)
Check magnesium. USG t.r.o DVT

## 2023-10-13 NOTE — Progress Notes (Signed)
Pharmacy Antibiotic Note  Patricia Kaiser is a 67 y.o. female admitted on 10/13/2023 with SHOB, non-productive cough, nausea, and sinus pressure. Pt was seen 3x wks ago by her pulmonologist. Was prescribed prednisone, breathing treatments, and doxycyline. Pt reports no improvement with meds. Pulmonologist recommended pt be seen today. Pharmacy has been consulted for cefepime dosing for COPD exacerbation.  Plan: Cefepime 2 g IV every 12 hours  for est CrCl 30-60 ml/min Monitor clinical progress & renal function F/U C&S, abx deescalation / LOT   Height: 5\' 2"  (157.5 cm) Weight: 49.9 kg (110 lb) IBW/kg (Calculated) : 50.1  Temp (24hrs), Avg:98.6 F (37 C), Min:98.4 F (36.9 C), Max:98.9 F (37.2 C)  Recent Labs  Lab 10/13/23 1631  WBC 11.4*  CREATININE 0.49    Estimated Creatinine Clearance: 54.5 mL/min (by C-G formula based on SCr of 0.49 mg/dL).    Allergies  Allergen Reactions   Cefdinir Hives, Itching and Rash    blister   Sulfa Antibiotics Anaphylaxis and Shortness Of Breath   Codeine Nausea Only   Erythromycin Hives   Gabapentin Nausea And Vomiting    GI upset, pt claims it made her "drunk"   Metformin And Related Nausea And Vomiting   Symbicort [Budesonide-Formoterol Fumarate]     Breaks mouth out    Antimicrobials this admission: 12/26 cefepime >>     Thank you for allowing pharmacy to be a part of this patient's care.  Lynden Ang, PharmD, BCPS 10/13/2023 8:40 PM

## 2023-10-13 NOTE — Assessment & Plan Note (Addendum)
C/w glargine at home dose. Add insulin sliding scale. Hold farxiga for now.

## 2023-10-13 NOTE — ED Notes (Signed)
Called lab to add on trop to previous BMP

## 2023-10-13 NOTE — Assessment & Plan Note (Addendum)
Patient chronically requiring 2 L/min of supplementary oxygen during periods of exertion intermittently.  Has had worsening shortness of breath for approximately 1 month with associated dry cough on and off anterior chest pain no leg swelling.  Occasional cramping of the legs.  Patient is felt to be in COPD exacerbation with poor response to outpatient therapy with prednisone taper and doxycycline.  Patient has received methylprednisolone as well as cefepime in the ER.  I will continue with 60 mg of methylprednisolone daily. GI ppx ordered.  Standing duoneb. Doxycycline and cefepime IV with peak flow monitoirng.  Will chek D-dimer, BNP to rule out alterantive etiologies, althoguht apteitn wheezes are marked and diffuse. Consideration of CT chest after D-dimer is back. Patinet noted tohave worsened metabolic alkalosis. Check VBG. Check resp viral panel

## 2023-10-13 NOTE — ED Provider Triage Note (Signed)
Emergency Medicine Provider Triage Evaluation Note  Patricia Kaiser , a 67 y.o. female  was evaluated in triage.  Pt with history of COPD complains of shortness of breath, nonproductive cough and sinus pressure.  Referred here by pulmonology.  Review of Systems  Positive:  Negative:   Physical Exam  BP (!) 119/58 (BP Location: Left Arm)   Pulse 82   Temp 98.9 F (37.2 C) (Oral)   Resp 18   SpO2 98%  Gen:   Persistent cough while on nasal cannula, sats dropping down to 82% while coughing otherwise maintaining mid 90% Resp:  Diffuse rhonchi  MSK:   Moves extremities without difficulty    Medical Decision Making  Medically screening exam initiated at 4:12 PM.  Appropriate orders placed.  Patricia Kaiser was informed that the remainder of the evaluation will be completed by another provider, this initial triage assessment does not replace that evaluation, and the importance of remaining in the ED until their evaluation is complete.     Halford Decamp, PA-C 10/13/23 [id number]

## 2023-10-13 NOTE — Progress Notes (Signed)
    Patient Name: Patricia Kaiser           DOB: 1955/12/18  MRN: [id number]      Admission Date: 10/13/2023  Attending Provider: Nolberto Hanlon, MD  Primary Diagnosis: COPD (chronic obstructive pulmonary disease) (HCC)   Level of care: Stepdown    CROSS COVER NOTE   Date of Service   10/13/2023   [name], 67 y.o. female, was admitted on 10/13/2023 for COPD (chronic obstructive pulmonary disease) (HCC).    HPI/Events of Note   Respiratory acidosis in the setting of COPD exacerbation  VBG- pH 7.2, pCO2 91, HCO3 35.6 Pt is A/O x4, Pt reports severe anxiety the last time she attempted to trial Bipap     Interventions/ Plan   Transfer to higher level of care for BiPAP therapy  Ativan        Anthoney Harada, DNP, ACNPC- AG [facility]

## 2023-10-13 NOTE — Assessment & Plan Note (Signed)
No lesion noticed where patient reported pain. Will treat with magic mouthwash and MVI

## 2023-10-13 NOTE — ED Provider Notes (Incomplete)
[place] EMERGENCY DEPARTMENT AT [facility] Provider Note   CSN: [id number] Arrival date & time: 10/13/23  1432     History {Add pertinent medical, surgical, social history, OB history to HPI:1} Chief Complaint  Patient presents with  . Shortness of Breath    Patricia Kaiser is a 67 y.o. female with history of COPD complains of shortness of breath, nonproductive cough and sinus pressure.  Pt was seen 3x wks ago by her pulmonologist. Was prescribed prednisone, breathing treatments, and doxycyline. Pt reports no improvement with meds. Reports intermittent mild central chest pain. Has had mild yellow phlegm with cough but no f/c. Trace leg swelling. Can't sleep d/t coughing so much. Pulmonologist recommended patient be seen in the ED today. While in triage, Persistent cough while on nasal cannula, sats dropping down to 82% while coughing otherwise maintaining mid 90%   Per chart review of [facility] pulmonology notes, patient has COPD Gold 3 with mild acute exacerbation, chronic hypoxic respiratory failure utilizing 2 L oxygen at night and with exertion.  Currently on Breo 200+ Incruse, albuterol as needed, prednisone taper with hycodan cough syrup.  Spirometry in the office on 09/28/2023 noted very severe obstruction   Past Medical History:  Diagnosis Date  . Chest pain    2D ECHO, 08/25/2010 - EF->55%, normal  . COPD (chronic obstructive pulmonary disease) (HCC)   . DOE (dyspnea on exertion)    NUC, 08/25/2010 - low risk scan, normal  . 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 Non-insulin dependent type 2 diabetes mellitus (HCC)   . Shortness of breath    MET TEST, 06/26/2012  . Tobacco abuse   . Ureteral stent retained        Home Medications Prior to Admission medications   Medication Sig Start Date End Date Taking? Authorizing Provider  albuterol (PROVENTIL) (2.5 MG/3ML) 0.083% nebulizer solution Take 3  mLs (2.5 mg total) by nebulization every 6 (six) hours as needed for wheezing or shortness of breath. 11/23/21  Yes Horton, Kristie M, DO  atorvastatin (LIPITOR) 20 MG tablet TAKE 1 TABLET (20 MG TOTAL) BY MOUTH DAILY. Patient taking differently: Take 20 mg by mouth at bedtime.   Yes Martin, Mary-Margaret, FNP  BREO ELLIPTA 100-25 MCG/ACT AEPB Inhale 1 puff into the lungs daily. 09/22/23  Yes [provider]  dapagliflozin propanediol (FARXIGA) 5 MG TABS tablet Take 5 mg by mouth at bedtime. 05/26/23  Yes [provider]  guaiFENesin (MUCINEX PO) Take 1 Dose by mouth daily as needed. Liquid   Yes [provider]  HYDROcodone bit-homatropine (HYCODAN) 5-1.5 MG/5ML syrup Take 5 mLs by mouth every 6 (six) hours as needed for cough. 09/28/23  Yes [provider]  lisinopril (ZESTRIL) 2.5 MG tablet Take 2.5 mg by mouth in the morning. 05/02/19  Yes [provider]  OXYGEN Inhale 2.5-5 L into the lungs See admin instructions. At bedtime And as needed for shortness of breath   Yes [provider]  PREMARIN 1.25 MG tablet TAKE 1 TABLET (1.25 MG TOTAL) BY MOUTH DAILY. Patient taking differently: Take 1.25 mg by mouth at bedtime. 09/20/14  Yes Martin, Mary-Margaret, FNP  SEMGLEE, YFGN, 100 UNIT/ML Pen Inject 25 Units into the skin in the morning. 12/09/21  Yes [provider]  umeclidinium bromide (INCRUSE ELLIPTA) 62.5 MCG/ACT AEPB Inhale 1 puff into the lungs daily. 11/24/22  Yes [provider]  acetaminophen (TYLENOL) 500 MG  tablet Take 1,000 mg by mouth every 6 (six) hours as needed for pain. Patient not taking: Reported on 10/13/2023    [provider]  guaiFENesin-dextromethorphan (ROBITUSSIN DM) 100-10 MG/5ML syrup Take 5 mLs by mouth every 4 (four) hours as needed for cough (chest congestion). Patient not taking: Reported on 10/13/2023 12/11/21   Lanae Boast, MD      Allergies    Cefdinir, Sulfa antibiotics, Codeine,  Erythromycin, Gabapentin, Metformin and related, and Symbicort [budesonide-formoterol fumarate]    Review of Systems   Review of Systems A 10 point review of systems was performed and is negative unless otherwise reported in HPI.  Physical Exam Updated Vital Signs BP (!) 161/63 (BP Location: Left Arm)   Pulse (!) 110   Temp 98.1 F (36.7 C) (Oral)   Resp (!) 30   Ht 5\' 2"  (1.575 m)   Wt 49.9 kg   SpO2 94%   BMI 20.12 kg/m  Physical Exam General: Normal appearing female, lying in bed.  HEENT: PERRLA, Sclera anicteric, MMM, trachea midline.  Cardiology: RRR, no murmurs/rubs/gallops. BL radial and DP pulses equal bilaterally.  Resp: Normal respiratory rate and effort. CTAB, no wheezes, rhonchi, crackles.  Abd: Soft, non-tender, non-distended. No rebound tenderness or guarding.  GU: Deferred. MSK: No peripheral edema or signs of trauma. Extremities without deformity or TTP. No cyanosis or clubbing. Skin: warm, dry.  Neuro: A&Ox4, CNs II-XII grossly intact. MAEs. Sensation grossly intact.  Psych: Normal mood and affect.   ED Results / Procedures / Treatments   Labs (all labs ordered are listed, but only abnormal results are displayed) Labs Reviewed  BASIC METABOLIC PANEL - Abnormal; Notable for the following components:      Result Value   Chloride 96 (*)    CO2 34 (*)    Glucose, Bld 222 (*)    Calcium 8.7 (*)    All other components within normal limits  CBC WITH DIFFERENTIAL/PLATELET - Abnormal; Notable for the following components:   WBC 11.4 (*)    All other components within normal limits  BLOOD GAS, VENOUS - Abnormal; Notable for the following components:   pH, Ven 7.2 (*)    pCO2, Ven 91 (*)    pO2, Ven 47 (*)    Bicarbonate 35.6 (*)    Acid-Base Excess 4.3 (*)    All other components within normal limits  CBC - Abnormal; Notable for the following components:   WBC 14.0 (*)    All other components within normal limits  GLUCOSE, CAPILLARY - Abnormal; Notable  for the following components:   Glucose-Capillary 273 (*)    All other components within normal limits  CBG MONITORING, ED - Abnormal; Notable for the following components:   Glucose-Capillary 249 (*)    All other components within normal limits  SARS CORONAVIRUS 2 BY RT PCR  RESPIRATORY PANEL BY PCR  BRAIN NATRIURETIC PEPTIDE  D-DIMER, QUANTITATIVE  CREATININE, SERUM  BRAIN NATRIURETIC PEPTIDE  HEMOGLOBIN A1C  HEPATIC FUNCTION PANEL  MAGNESIUM  HIV ANTIBODY (ROUTINE TESTING W REFLEX)  APTT  PROTIME-INR  BASIC METABOLIC PANEL  CBC  TROPONIN I (HIGH SENSITIVITY)    EKG EKG Interpretation Date/Time:  Thursday October 13 2023 17:00:21 EST Ventricular Rate:  79 PR Interval:  114 QRS Duration:  66 QT Interval:  362 QTC Calculation: 415 R Axis:   67  Text Interpretation: Sinus rhythm Borderline short PR interval Biatrial enlargement Probable anteroseptal infarct, old Confirmed by Vivi Barrack [phone]) on 10/13/2023 7:01:27 PM  Radiology DG Chest 2 View Result Date: 10/13/2023 CLINICAL DATA:  Pt complaining SHOB, non-productive cough, nausea, and sinus pressure. Pt was seen 3x wks ago by her pulmonologist. Was prescribed prednisone, breathing treatments, and doxycyline. EXAM: CHEST - 2 VIEW COMPARISON:  Chest x-ray 04/08/2022 FINDINGS: The heart and mediastinal contours are unchanged. Hyperinflation of the lungs. No focal consolidation. No pulmonary edema. Nonspecific blunting of bilateral costophrenic angles. No pleural effusion. No pneumothorax. No acute osseous abnormality. IMPRESSION: No active cardiopulmonary disease. Electronically Signed   By: Tish Frederickson M.D.   On: 10/13/2023 17:23    Procedures Procedures  {Document cardiac monitor, telemetry assessment procedure when appropriate:1}  Medications Ordered in ED Medications  ipratropium-albuterol (DUONEB) 0.5-2.5 (3) MG/3ML nebulizer solution 3 mL (3 mLs Nebulization Not Given 10/13/23 2210)  ceFEPIme (MAXIPIME) 2  g in sodium chloride 0.9 % 100 mL IVPB (0 g Intravenous Stopped 10/13/23 2143)  magic mouthwash w/lidocaine (has no administration in time range)  multivitamin with minerals tablet 1 tablet (1 tablet Oral Given 10/13/23 2209)  fluticasone furoate-vilanterol (BREO ELLIPTA) 100-25 MCG/ACT 1 puff (has no administration in time range)  umeclidinium bromide (INCRUSE ELLIPTA) 62.5 MCG/ACT 1 puff (has no administration in time range)  insulin glargine-yfgn (SEMGLEE) injection 25 Units (has no administration in time range)  atorvastatin (LIPITOR) tablet 20 mg (20 mg Oral Given 10/13/23 2210)  lisinopril (ZESTRIL) tablet 2.5 mg (has no administration in time range)  guaiFENesin (MUCINEX) 12 hr tablet 600 mg (600 mg Oral Given 10/13/23 2210)  HYDROcodone bit-homatropine (HYCODAN) 5-1.5 MG/5ML syrup 5 mL (has no administration in time range)  albuterol (PROVENTIL) (2.5 MG/3ML) 0.083% nebulizer solution 2.5 mg (has no administration in time range)  insulin aspart (novoLOG) injection 0-9 Units (has no administration in time range)  insulin aspart (novoLOG) injection 0-5 Units (3 Units Subcutaneous Given 10/13/23 2254)  methylPREDNISolone sodium succinate (SOLU-MEDROL) 125 mg/2 mL injection 60 mg (has no administration in time range)  pantoprazole (PROTONIX) EC tablet 20 mg (20 mg Oral Given 10/13/23 2210)  ipratropium-albuterol (DUONEB) 0.5-2.5 (3) MG/3ML nebulizer solution 3 mL (3 mLs Nebulization Given 10/13/23 2209)  doxycycline (VIBRAMYCIN) 100 mg in dextrose 5 % 250 mL IVPB (has no administration in time range)  enoxaparin (LOVENOX) injection 40 mg (has no administration in time range)  acetaminophen (TYLENOL) tablet 650 mg (has no administration in time range)    Or  acetaminophen (TYLENOL) suppository 650 mg (has no administration in time range)  polyethylene glycol (MIRALAX / GLYCOLAX) packet 17 g (has no administration in time range)  sodium chloride flush (NS) 0.9 % injection 3 mL (3 mLs  Intravenous Given 10/13/23 2254)  LORazepam (ATIVAN) injection 0.5 mg (has no administration in time range)  methylPREDNISolone sodium succinate (SOLU-MEDROL) 125 mg/2 mL injection 125 mg (125 mg Intravenous Given 10/13/23 2027)    ED Course/ Medical Decision Making/ A&P                          Medical Decision Making Risk Prescription drug management. Decision regarding hospitalization.    This patient presents to the ED for concern of ***, this involves an extensive number of treatment options, and is a complaint that carries with it a high risk of complications and morbidity.  I considered the following differential and admission for this acute, potentially life threatening condition.   MDM:    ***     Labs: I Ordered, and personally interpreted labs.  The pertinent results include:  those listed above  Imaging Studies ordered: I ordered imaging studies including CXR I independently visualized and interpreted imaging. I agree with the radiologist interpretation  Additional history obtained from chart review.  External records from outside source obtained and reviewed including ***  Cardiac Monitoring: .The patient was maintained on a cardiac monitor.  I personally viewed and interpreted the cardiac monitored which showed an underlying rhythm of: NSR  Reevaluation: After the interventions noted above, I reevaluated the patient and found that they have :{resolved/improved/worsened:23923::"improved"}  Social Determinants of Health: .Lives independently  Disposition:  ***  Co morbidities that complicate the patient evaluation . Past Medical History:  Diagnosis Date  . Chest pain    2D ECHO, 08/25/2010 - EF->55%, normal  . COPD (chronic obstructive pulmonary disease) (HCC)   . DOE (dyspnea on exertion)    NUC, 08/25/2010 - low risk scan, normal  . 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 Non-insulin dependent  type 2 diabetes mellitus (HCC)   . Shortness of breath    MET TEST, 06/26/2012  . Tobacco abuse   . Ureteral stent retained      Medicines Meds ordered this encounter  Medications  . ipratropium-albuterol (DUONEB) 0.5-2.5 (3) MG/3ML nebulizer solution 3 mL  . methylPREDNISolone sodium succinate (SOLU-MEDROL) 125 mg/2 mL injection 125 mg    IV methylprednisolone will be converted to either a q12h or q24h frequency with the same total daily dose (TDD).  Ordered Dose: 1 to 125 mg TDD; convert to: TDD q24h.  Ordered Dose: 126 to 250 mg TDD; convert to: TDD div q12h.  Ordered Dose: >250 mg TDD; DAW.  . ceFEPIme (MAXIPIME) 2 g in sodium chloride 0.9 % 100 mL IVPB    Antibiotic Indication::   Other Indication (list below)    Other Indication::   COPD exacerbation  . magic mouthwash w/lidocaine  . multivitamin with minerals tablet 1 tablet  . fluticasone furoate-vilanterol (BREO ELLIPTA) 100-25 MCG/ACT 1 puff  . umeclidinium bromide (INCRUSE ELLIPTA) 62.5 MCG/ACT 1 puff  . insulin glargine-yfgn (SEMGLEE) injection 25 Units  . atorvastatin (LIPITOR) tablet 20 mg  . lisinopril (ZESTRIL) tablet 2.5 mg  . guaiFENesin (MUCINEX) 12 hr tablet 600 mg  . HYDROcodone bit-homatropine (HYCODAN) 5-1.5 MG/5ML syrup 5 mL    Refill:  0  . albuterol (PROVENTIL) (2.5 MG/3ML) 0.083% nebulizer solution 2.5 mg  . insulin aspart (novoLOG) injection 0-9 Units    Correction coverage::   Sensitive (thin, NPO, renal)    CBG < 70::   Implement Hypoglycemia Standing Orders and refer to Hypoglycemia Standing Orders sidebar report    CBG 70 - 120::   0 units    CBG 121 - 150::   1 unit    CBG 151 - 200::   2 units    CBG 201 - 250::   3 units    CBG 251 - 300::   5 units    CBG 301 - 350::   7 units    CBG 351 - 400:   9 units    CBG > 400:   call MD and obtain STAT lab verification  . insulin aspart (novoLOG) injection 0-5 Units    Correction coverage::   HS scale    CBG < 70::   Implement Hypoglycemia Standing  Orders and refer to Hypoglycemia Standing Orders sidebar report    CBG 70 - 120::   0 units    CBG 121 -  150::   0 units    CBG 151 - 200::   0 units    CBG 201 - 250::   2 units    CBG 251 - 300::   3 units    CBG 301 - 350::   4 units    CBG 351 - 400::   5 units    CBG > 400:   call MD and obtain STAT lab verification  . methylPREDNISolone sodium succinate (SOLU-MEDROL) 125 mg/2 mL injection 60 mg  . pantoprazole (PROTONIX) EC tablet 20 mg  . ipratropium-albuterol (DUONEB) 0.5-2.5 (3) MG/3ML nebulizer solution 3 mL  . doxycycline (VIBRAMYCIN) 100 mg in dextrose 5 % 250 mL IVPB    Antibiotic Indication::   CAP  . enoxaparin (LOVENOX) injection 40 mg  . OR Linked Order Group   . acetaminophen (TYLENOL) tablet 650 mg   . acetaminophen (TYLENOL) suppository 650 mg  . polyethylene glycol (MIRALAX / GLYCOLAX) packet 17 g  . sodium chloride flush (NS) 0.9 % injection 3 mL  . LORazepam (ATIVAN) injection 0.5 mg    I have reviewed the patients home medicines and have made adjustments as needed  Problem List / ED Course: Problem List Items Addressed This Visit       Respiratory   COPD exacerbation (HCC) - Primary   Relevant Medications   BREO ELLIPTA 100-25 MCG/ACT AEPB   HYDROcodone bit-homatropine (HYCODAN) 5-1.5 MG/5ML syrup   umeclidinium bromide (INCRUSE ELLIPTA) 62.5 MCG/ACT AEPB   guaiFENesin (MUCINEX PO)   magic mouthwash w/lidocaine   fluticasone furoate-vilanterol (BREO ELLIPTA) 100-25 MCG/ACT 1 puff (Start on 10/14/2023  8:00 AM)   umeclidinium bromide (INCRUSE ELLIPTA) 62.5 MCG/ACT 1 puff (Start on 10/14/2023  8:00 AM)   guaiFENesin (MUCINEX) 12 hr tablet 600 mg   HYDROcodone bit-homatropine (HYCODAN) 5-1.5 MG/5ML syrup 5 mL   albuterol (PROVENTIL) (2.5 MG/3ML) 0.083% nebulizer solution 2.5 mg   methylPREDNISolone sodium succinate (SOLU-MEDROL) 125 mg/2 mL injection 60 mg (Start on 10/14/2023  6:00 AM)   ipratropium-albuterol (DUONEB) 0.5-2.5 (3) MG/3ML nebulizer  solution 3 mL         {Document critical care time when appropriate:1} {Document review of labs and clinical decision tools ie heart score, Chads2Vasc2 etc:1}  {Document your independent review of radiology images, and any outside records:1} {Document your discussion with family members, caretakers, and with consultants:1} {Document social determinants of health affecting pt's care:1} {Document your decision making why or why not admission, treatments were needed:1}  This note was created using dictation software, which may contain spelling or grammatical errors.

## 2023-10-13 NOTE — ED Triage Notes (Signed)
Pt complaining SHOB, non-productive cough, nausea, and sinus pressure. Pt was seen 3x wks ago by her pulmonologist. Was prescribed prednisone, breathing treatments, and doxycyline. Pt reports no improvement with meds. Pulmonologist recommended pt be seen today.

## 2023-10-13 NOTE — ED Provider Notes (Signed)
[place] EMERGENCY DEPARTMENT AT [facility] Provider Note   CSN: [id number] Arrival date & time: 10/13/23  1432     History {Add pertinent medical, surgical, social history, OB history to HPI:1} Chief Complaint  Patient presents with   Shortness of Breath    Patricia Kaiser is a 67 y.o. female with history of COPD complains of shortness of breath, nonproductive cough and sinus pressure.  Pt was seen 3x wks ago by her pulmonologist. Was prescribed prednisone, breathing treatments, and doxycyline. Pt reports no improvement with meds. Reports intermittent mild central chest pain. Has had mild yellow phlegm with cough but no f/c. Trace leg swelling. Can't sleep d/t coughing so much. Pulmonologist recommended patient be seen in the ED today. While in triage, Persistent cough while on nasal cannula, sats dropping down to 82% while coughing otherwise maintaining mid 90%   Per chart review of [facility] pulmonology notes, patient has COPD Gold 3 with mild acute exacerbation, chronic hypoxic respiratory failure utilizing 2 L oxygen at night and with exertion.  Currently on Breo 200+ Incruse, albuterol as needed, prednisone taper with hycodan cough syrup.  Spirometry in the office on 09/28/2023 noted very severe obstruction   Past Medical History:  Diagnosis Date   Chest pain    2D ECHO, 08/25/2010 - EF->55%, normal   COPD (chronic obstructive pulmonary disease) (HCC)    DOE (dyspnea on exertion)    NUC, 08/25/2010 - low risk scan, normal   Hypertension    RENAL DOPPLER, 10/01/2010 - 1-59% diameter reduction of the R. renal artery   Non-insulin dependent type 2 diabetes mellitus (HCC)    Shortness of breath    MET TEST, 06/26/2012   Tobacco abuse    Ureteral stent retained        Home Medications Prior to Admission medications   Medication Sig Start Date End Date Taking? Authorizing Provider  albuterol (PROVENTIL) (2.5 MG/3ML) 0.083% nebulizer solution Take 3 mLs (2.5 mg  total) by nebulization every 6 (six) hours as needed for wheezing or shortness of breath. 11/23/21  Yes Horton, Kristie M, DO  atorvastatin (LIPITOR) 20 MG tablet TAKE 1 TABLET (20 MG TOTAL) BY MOUTH DAILY. Patient taking differently: Take 20 mg by mouth at bedtime.   Yes Martin, Mary-Margaret, FNP  BREO ELLIPTA 100-25 MCG/ACT AEPB Inhale 1 puff into the lungs daily. 09/22/23  Yes [provider]  dapagliflozin propanediol (FARXIGA) 5 MG TABS tablet Take 5 mg by mouth at bedtime. 05/26/23  Yes [provider]  guaiFENesin (MUCINEX PO) Take 1 Dose by mouth daily as needed. Liquid   Yes [provider]  HYDROcodone bit-homatropine (HYCODAN) 5-1.5 MG/5ML syrup Take 5 mLs by mouth every 6 (six) hours as needed for cough. 09/28/23  Yes [provider]  lisinopril (ZESTRIL) 2.5 MG tablet Take 2.5 mg by mouth in the morning. 05/02/19  Yes [provider]  OXYGEN Inhale 2.5-5 L into the lungs See admin instructions. At bedtime And as needed for shortness of breath   Yes [provider]  PREMARIN 1.25 MG tablet TAKE 1 TABLET (1.25 MG TOTAL) BY MOUTH DAILY. Patient taking differently: Take 1.25 mg by mouth at bedtime. 09/20/14  Yes Martin, Mary-Margaret, FNP  SEMGLEE, YFGN, 100 UNIT/ML Pen Inject 25 Units into the skin in the morning. 12/09/21  Yes [provider]  umeclidinium bromide (INCRUSE ELLIPTA) 62.5 MCG/ACT AEPB Inhale 1 puff into the lungs daily. 11/24/22  Yes [provider]  acetaminophen (TYLENOL) 500 MG  tablet Take 1,000 mg by mouth every 6 (six) hours as needed for pain. Patient not taking: Reported on 10/13/2023    [provider]  guaiFENesin-dextromethorphan (ROBITUSSIN DM) 100-10 MG/5ML syrup Take 5 mLs by mouth every 4 (four) hours as needed for cough (chest congestion). Patient not taking: Reported on 10/13/2023 12/11/21   Lanae Boast, MD      Allergies    Cefdinir, Sulfa antibiotics, Codeine, Erythromycin,  Gabapentin, Metformin and related, and Symbicort [budesonide-formoterol fumarate]    Review of Systems   Review of Systems A 10 point review of systems was performed and is negative unless otherwise reported in HPI.  Physical Exam Updated Vital Signs BP (!) 161/63 (BP Location: Left Arm)   Pulse (!) 110   Temp 98.1 F (36.7 C) (Oral)   Resp (!) 30   Ht 5\' 2"  (1.575 m)   Wt 49.9 kg   SpO2 94%   BMI 20.12 kg/m  Physical Exam General: Normal appearing female, lying in bed.  HEENT: PERRLA, Sclera anicteric, MMM, trachea midline.  Cardiology: RRR, no murmurs/rubs/gallops. BL radial and DP pulses equal bilaterally.  Resp: Normal respiratory rate and effort. CTAB, no wheezes, rhonchi, crackles.  Abd: Soft, non-tender, non-distended. No rebound tenderness or guarding.  GU: Deferred. MSK: No peripheral edema or signs of trauma. Extremities without deformity or TTP. No cyanosis or clubbing. Skin: warm, dry.  Neuro: A&Ox4, CNs II-XII grossly intact. MAEs. Sensation grossly intact.  Psych: Normal mood and affect.   ED Results / Procedures / Treatments   Labs (all labs ordered are listed, but only abnormal results are displayed) Labs Reviewed  BASIC METABOLIC PANEL - Abnormal; Notable for the following components:      Result Value   Chloride 96 (*)    CO2 34 (*)    Glucose, Bld 222 (*)    Calcium 8.7 (*)    All other components within normal limits  CBC WITH DIFFERENTIAL/PLATELET - Abnormal; Notable for the following components:   WBC 11.4 (*)    All other components within normal limits  BLOOD GAS, VENOUS - Abnormal; Notable for the following components:   pH, Ven 7.2 (*)    pCO2, Ven 91 (*)    pO2, Ven 47 (*)    Bicarbonate 35.6 (*)    Acid-Base Excess 4.3 (*)    All other components within normal limits  CBC - Abnormal; Notable for the following components:   WBC 14.0 (*)    All other components within normal limits  GLUCOSE, CAPILLARY - Abnormal; Notable for the  following components:   Glucose-Capillary 273 (*)    All other components within normal limits  CBG MONITORING, ED - Abnormal; Notable for the following components:   Glucose-Capillary 249 (*)    All other components within normal limits  SARS CORONAVIRUS 2 BY RT PCR  RESPIRATORY PANEL BY PCR  BRAIN NATRIURETIC PEPTIDE  D-DIMER, QUANTITATIVE  CREATININE, SERUM  BRAIN NATRIURETIC PEPTIDE  HEMOGLOBIN A1C  HEPATIC FUNCTION PANEL  MAGNESIUM  HIV ANTIBODY (ROUTINE TESTING W REFLEX)  APTT  PROTIME-INR  BASIC METABOLIC PANEL  CBC  TROPONIN I (HIGH SENSITIVITY)    EKG EKG Interpretation Date/Time:  Thursday October 13 2023 17:00:21 EST Ventricular Rate:  79 PR Interval:  114 QRS Duration:  66 QT Interval:  362 QTC Calculation: 415 R Axis:   67  Text Interpretation: Sinus rhythm Borderline short PR interval Biatrial enlargement Probable anteroseptal infarct, old Confirmed by Vivi Barrack [phone]) on 10/13/2023 7:01:27 PM  Radiology DG Chest 2 View Result Date: 10/13/2023 CLINICAL DATA:  Pt complaining SHOB, non-productive cough, nausea, and sinus pressure. Pt was seen 3x wks ago by her pulmonologist. Was prescribed prednisone, breathing treatments, and doxycyline. EXAM: CHEST - 2 VIEW COMPARISON:  Chest x-ray 04/08/2022 FINDINGS: The heart and mediastinal contours are unchanged. Hyperinflation of the lungs. No focal consolidation. No pulmonary edema. Nonspecific blunting of bilateral costophrenic angles. No pleural effusion. No pneumothorax. No acute osseous abnormality. IMPRESSION: No active cardiopulmonary disease. Electronically Signed   By: Tish Frederickson M.D.   On: 10/13/2023 17:23    Procedures Procedures  {Document cardiac monitor, telemetry assessment procedure when appropriate:1}  Medications Ordered in ED Medications  ipratropium-albuterol (DUONEB) 0.5-2.5 (3) MG/3ML nebulizer solution 3 mL (3 mLs Nebulization Not Given 10/13/23 2210)  ceFEPIme (MAXIPIME) 2 g in  sodium chloride 0.9 % 100 mL IVPB (0 g Intravenous Stopped 10/13/23 2143)  magic mouthwash w/lidocaine (has no administration in time range)  multivitamin with minerals tablet 1 tablet (1 tablet Oral Given 10/13/23 2209)  fluticasone furoate-vilanterol (BREO ELLIPTA) 100-25 MCG/ACT 1 puff (has no administration in time range)  umeclidinium bromide (INCRUSE ELLIPTA) 62.5 MCG/ACT 1 puff (has no administration in time range)  insulin glargine-yfgn (SEMGLEE) injection 25 Units (has no administration in time range)  atorvastatin (LIPITOR) tablet 20 mg (20 mg Oral Given 10/13/23 2210)  lisinopril (ZESTRIL) tablet 2.5 mg (has no administration in time range)  guaiFENesin (MUCINEX) 12 hr tablet 600 mg (600 mg Oral Given 10/13/23 2210)  HYDROcodone bit-homatropine (HYCODAN) 5-1.5 MG/5ML syrup 5 mL (has no administration in time range)  albuterol (PROVENTIL) (2.5 MG/3ML) 0.083% nebulizer solution 2.5 mg (has no administration in time range)  insulin aspart (novoLOG) injection 0-9 Units (has no administration in time range)  insulin aspart (novoLOG) injection 0-5 Units (3 Units Subcutaneous Given 10/13/23 2254)  methylPREDNISolone sodium succinate (SOLU-MEDROL) 125 mg/2 mL injection 60 mg (has no administration in time range)  pantoprazole (PROTONIX) EC tablet 20 mg (20 mg Oral Given 10/13/23 2210)  ipratropium-albuterol (DUONEB) 0.5-2.5 (3) MG/3ML nebulizer solution 3 mL (3 mLs Nebulization Given 10/13/23 2209)  doxycycline (VIBRAMYCIN) 100 mg in dextrose 5 % 250 mL IVPB (has no administration in time range)  enoxaparin (LOVENOX) injection 40 mg (has no administration in time range)  acetaminophen (TYLENOL) tablet 650 mg (has no administration in time range)    Or  acetaminophen (TYLENOL) suppository 650 mg (has no administration in time range)  polyethylene glycol (MIRALAX / GLYCOLAX) packet 17 g (has no administration in time range)  sodium chloride flush (NS) 0.9 % injection 3 mL (3 mLs Intravenous  Given 10/13/23 2254)  LORazepam (ATIVAN) injection 0.5 mg (has no administration in time range)  methylPREDNISolone sodium succinate (SOLU-MEDROL) 125 mg/2 mL injection 125 mg (125 mg Intravenous Given 10/13/23 2027)    ED Course/ Medical Decision Making/ A&P                          Medical Decision Making Risk Prescription drug management. Decision regarding hospitalization.    This patient presents to the ED for concern of ***, this involves an extensive number of treatment options, and is a complaint that carries with it a high risk of complications and morbidity.  I considered the following differential and admission for this acute, potentially life threatening condition.   MDM:    ***     Labs: I Ordered, and personally interpreted labs.  The pertinent results include:  those listed above  Imaging Studies ordered: I ordered imaging studies including CXR I independently visualized and interpreted imaging. I agree with the radiologist interpretation  Additional history obtained from chart review.  External records from outside source obtained and reviewed including ***  Cardiac Monitoring: The patient was maintained on a cardiac monitor.  I personally viewed and interpreted the cardiac monitored which showed an underlying rhythm of: NSR  Reevaluation: After the interventions noted above, I reevaluated the patient and found that they have :{resolved/improved/worsened:23923::"improved"}  Social Determinants of Health: Lives independently  Disposition:  ***  Co morbidities that complicate the patient evaluation  Past Medical History:  Diagnosis Date   Chest pain    2D ECHO, 08/25/2010 - EF->55%, normal   COPD (chronic obstructive pulmonary disease) (HCC)    DOE (dyspnea on exertion)    NUC, 08/25/2010 - low risk scan, normal   Hypertension    RENAL DOPPLER, 10/01/2010 - 1-59% diameter reduction of the R. renal artery   Non-insulin dependent type 2 diabetes  mellitus (HCC)    Shortness of breath    MET TEST, 06/26/2012   Tobacco abuse    Ureteral stent retained      Medicines Meds ordered this encounter  Medications   ipratropium-albuterol (DUONEB) 0.5-2.5 (3) MG/3ML nebulizer solution 3 mL   methylPREDNISolone sodium succinate (SOLU-MEDROL) 125 mg/2 mL injection 125 mg    IV methylprednisolone will be converted to either a q12h or q24h frequency with the same total daily dose (TDD).  Ordered Dose: 1 to 125 mg TDD; convert to: TDD q24h.  Ordered Dose: 126 to 250 mg TDD; convert to: TDD div q12h.  Ordered Dose: >250 mg TDD; DAW.   ceFEPIme (MAXIPIME) 2 g in sodium chloride 0.9 % 100 mL IVPB    Antibiotic Indication::   Other Indication (list below)    Other Indication::   COPD exacerbation   magic mouthwash w/lidocaine   multivitamin with minerals tablet 1 tablet   fluticasone furoate-vilanterol (BREO ELLIPTA) 100-25 MCG/ACT 1 puff   umeclidinium bromide (INCRUSE ELLIPTA) 62.5 MCG/ACT 1 puff   insulin glargine-yfgn (SEMGLEE) injection 25 Units   atorvastatin (LIPITOR) tablet 20 mg   lisinopril (ZESTRIL) tablet 2.5 mg   guaiFENesin (MUCINEX) 12 hr tablet 600 mg   HYDROcodone bit-homatropine (HYCODAN) 5-1.5 MG/5ML syrup 5 mL    Refill:  0   albuterol (PROVENTIL) (2.5 MG/3ML) 0.083% nebulizer solution 2.5 mg   insulin aspart (novoLOG) injection 0-9 Units    Correction coverage::   Sensitive (thin, NPO, renal)    CBG < 70::   Implement Hypoglycemia Standing Orders and refer to Hypoglycemia Standing Orders sidebar report    CBG 70 - 120::   0 units    CBG 121 - 150::   1 unit    CBG 151 - 200::   2 units    CBG 201 - 250::   3 units    CBG 251 - 300::   5 units    CBG 301 - 350::   7 units    CBG 351 - 400:   9 units    CBG > 400:   call MD and obtain STAT lab verification   insulin aspart (novoLOG) injection 0-5 Units    Correction coverage::   HS scale    CBG < 70::   Implement Hypoglycemia Standing Orders and refer to Hypoglycemia  Standing Orders sidebar report    CBG 70 - 120::   0 units    CBG 121 -  150::   0 units    CBG 151 - 200::   0 units    CBG 201 - 250::   2 units    CBG 251 - 300::   3 units    CBG 301 - 350::   4 units    CBG 351 - 400::   5 units    CBG > 400:   call MD and obtain STAT lab verification   methylPREDNISolone sodium succinate (SOLU-MEDROL) 125 mg/2 mL injection 60 mg   pantoprazole (PROTONIX) EC tablet 20 mg   ipratropium-albuterol (DUONEB) 0.5-2.5 (3) MG/3ML nebulizer solution 3 mL   doxycycline (VIBRAMYCIN) 100 mg in dextrose 5 % 250 mL IVPB    Antibiotic Indication::   CAP   enoxaparin (LOVENOX) injection 40 mg   OR Linked Order Group    acetaminophen (TYLENOL) tablet 650 mg    acetaminophen (TYLENOL) suppository 650 mg   polyethylene glycol (MIRALAX / GLYCOLAX) packet 17 g   sodium chloride flush (NS) 0.9 % injection 3 mL   LORazepam (ATIVAN) injection 0.5 mg    I have reviewed the patients home medicines and have made adjustments as needed  Problem List / ED Course: Problem List Items Addressed This Visit       Respiratory   COPD exacerbation (HCC) - Primary   Relevant Medications   BREO ELLIPTA 100-25 MCG/ACT AEPB   HYDROcodone bit-homatropine (HYCODAN) 5-1.5 MG/5ML syrup   umeclidinium bromide (INCRUSE ELLIPTA) 62.5 MCG/ACT AEPB   guaiFENesin (MUCINEX PO)   magic mouthwash w/lidocaine   fluticasone furoate-vilanterol (BREO ELLIPTA) 100-25 MCG/ACT 1 puff (Start on 10/14/2023  8:00 AM)   umeclidinium bromide (INCRUSE ELLIPTA) 62.5 MCG/ACT 1 puff (Start on 10/14/2023  8:00 AM)   guaiFENesin (MUCINEX) 12 hr tablet 600 mg   HYDROcodone bit-homatropine (HYCODAN) 5-1.5 MG/5ML syrup 5 mL   albuterol (PROVENTIL) (2.5 MG/3ML) 0.083% nebulizer solution 2.5 mg   methylPREDNISolone sodium succinate (SOLU-MEDROL) 125 mg/2 mL injection 60 mg (Start on 10/14/2023  6:00 AM)   ipratropium-albuterol (DUONEB) 0.5-2.5 (3) MG/3ML nebulizer solution 3 mL         {Document critical  care time when appropriate:1} {Document review of labs and clinical decision tools ie heart score, Chads2Vasc2 etc:1}  {Document your independent review of radiology images, and any outside records:1} {Document your discussion with family members, caretakers, and with consultants:1} {Document social determinants of health affecting pt's care:1} {Document your decision making why or why not admission, treatments were needed:1}  This note was created using dictation software, which may contain spelling or grammatical errors.

## 2023-10-13 NOTE — H&P (Signed)
History and Physical    Patient: Patricia Kaiser VHQ:[id number] DOB: 11-Sep-1956 DOA: 10/13/2023 DOS: the patient was seen and examined on 10/13/2023 PCP: Rebecka Apley, NP  Patient coming from: Home>pulm clnic > [facility] ER  Chief Complaint:  Chief Complaint  Patient presents with   Shortness of Breath   HPI: Patricia Kaiser is a 67 y.o. female with medical history significant of COPD.  At baseline patient typically uses 2 L/min of supplementary oxygen during periods of exertion.  However this use and it is intermittent, patient may be able to do significant household work without needing oxygen.  However for the last 30 days or so patient reports new onset of cough that is mostly dry with associated worsening sensation of shortness of breath such as she is constantly having to use 2 L/min of supplementary oxygen including at rest.  For the last couple of weeks she has been using as much as 5 L/min.  There is no report of patient having any fever loss of consciousness palpitation.  No reported leg swelling.  However patient's shortness of breath has continued to get worse.  Patient reports on and off occasional bilateral chest discomfort/pain anteriorly during periods of exertion/rapid breathing.  Patient sees pulmonology in [place].  Per report patient has completed a course of prednisone taper as well as doxycycline as outpatient.  Unfortunately patient's symptoms have not improved.  Furthermore per report, patient was at pulmonology office and noted to have severe obstruction on spirometry today, sent to the ER for further evaluation.  In the ER patient is being maintained on as much as 5 L/min of supplementary oxygen for her comfort.  Patient has received cefepime.  Medical evaluation is sought  Patient also reports occasional leg cramps as above.  Otherwise no new complaints.  Patient also reports 1 to 2 weeks of burning sensation of her anterior tongue.  She is concerned that she  may have thrush.  No odynophagia Review of Systems: As mentioned in the history of present illness. All other systems reviewed and are negative. 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 She has a 7.5 pack-year smoking history. She has never used smokeless tobacco. She reports current alcohol use. She reports that she does not use drugs.  Allergies  Allergen Reactions   Cefdinir Hives, Itching and Rash    blister   Sulfa Antibiotics Anaphylaxis and Shortness Of Breath   Codeine Nausea Only   Erythromycin Hives   Gabapentin Nausea And Vomiting    GI upset, pt claims it made her "drunk"   Metformin And Related Nausea And Vomiting   Symbicort [Budesonide-Formoterol Fumarate]     Breaks mouth out    Family History  Problem Relation  Age of Onset   COPD Mother    Lung cancer Mother    Arrhythmia Mother        Atrial Fibrillation   COPD Father    Emphysema Father    Heart failure Father    Diabetes Sister    Hyperlipidemia Sister    Hyperlipidemia Brother    Heart attack Maternal Grandfather    Stroke Paternal Grandfather    Diabetes Sister    Heart murmur Daughter     Prior to Admission medications   Medication Sig Start Date End Date Taking? Authorizing Provider  albuterol (PROVENTIL) (2.5 MG/3ML) 0.083%  nebulizer solution Take 3 mLs (2.5 mg total) by nebulization every 6 (six) hours as needed for wheezing or shortness of breath. 11/23/21  Yes Horton, Kristie M, DO  atorvastatin (LIPITOR) 20 MG tablet TAKE 1 TABLET (20 MG TOTAL) BY MOUTH DAILY. Patient taking differently: Take 20 mg by mouth at bedtime.   Yes Martin, Mary-Margaret, FNP  BREO ELLIPTA 100-25 MCG/ACT AEPB Inhale 1 puff into the lungs daily. 09/22/23  Yes [provider]  dapagliflozin propanediol (FARXIGA) 5 MG TABS tablet Take 5 mg by mouth at bedtime. 05/26/23  Yes [provider]  guaiFENesin (MUCINEX PO) Take 1 Dose by mouth daily as needed. Liquid   Yes [provider]  HYDROcodone bit-homatropine (HYCODAN) 5-1.5 MG/5ML syrup Take 5 mLs by mouth every 6 (six) hours as needed for cough. 09/28/23  Yes [provider]  lisinopril (ZESTRIL) 2.5 MG tablet Take 2.5 mg by mouth in the morning. 05/02/19  Yes [provider]  OXYGEN Inhale 2.5-5 L into the lungs See admin instructions. At bedtime And as needed for shortness of breath   Yes [provider]  PREMARIN 1.25 MG tablet TAKE 1 TABLET (1.25 MG TOTAL) BY MOUTH DAILY. Patient taking differently: Take 1.25 mg by mouth at bedtime. 09/20/14  Yes Martin, Mary-Margaret, FNP  SEMGLEE, YFGN, 100 UNIT/ML Pen Inject 25 Units into the skin in the morning. 12/09/21  Yes [provider]  umeclidinium bromide (INCRUSE ELLIPTA) 62.5 MCG/ACT AEPB Inhale 1 puff into the lungs daily. 11/24/22  Yes [provider]  acetaminophen (TYLENOL) 500 MG tablet Take 1,000 mg by mouth every 6 (six) hours as needed for pain. Patient not taking: Reported on 10/13/2023    [provider]  guaiFENesin-dextromethorphan (ROBITUSSIN DM) 100-10 MG/5ML syrup Take 5 mLs by mouth every 4 (four) hours as needed for cough (chest congestion). Patient not taking: Reported on 10/13/2023 12/11/21   Lanae Boast, MD    Physical Exam: Vitals:   10/13/23  1615 10/13/23 1900 10/13/23 2013 10/13/23 2017  BP:  111/78 104/69   Pulse:  82 79   Resp:  (!) 23 17   Temp:    98.6 F (37 C)  TempSrc:    Oral  SpO2:  92% 94%   Weight: 49.9 kg     Height: 5\' 2"  (1.575 m)      General patient does not appear to be distressed.  Seems reasonably functional independent including standing up and taking care of her staff when initially encountered.  She is a thin lady.  However tachypnea is noticeable.  Patient even at rest has to occasionally pause her speech to take a breath in.  Patient currently on 5 L/min of supplementary oxygen. Respiratory exam: Diffuse expiratory wheezes marked.  Reasonable excursion Cardiovascular exam S1-S2 normal Abdomen all quadrant soft nontender Extremities warm without edema. Data Reviewed:  Labs on Admission:  Results for orders placed or performed during the hospital encounter of 10/13/23 (from the past 24 hours)  Basic metabolic panel     Status: Abnormal   Collection Time: 10/13/23  4:31 PM  Result Value Ref Range   Sodium 136 135 - 145 mmol/L   Potassium 4.0 3.5 - 5.1 mmol/L   Chloride 96 (L) 98 - 111 mmol/L   CO2 34 (H) 22 - 32 mmol/L   Glucose, Bld 222 (H) 70 - 99 mg/dL   BUN 8 8 - 23 mg/dL   Creatinine, Ser 5.78 0.44 - 1.00 mg/dL   Calcium 8.7 (L) 8.9 - 10.3 mg/dL   GFR, Estimated >46 >96 mL/min   Anion gap 6 5 - 15  Brain natriuretic peptide     Status: None   Collection Time: 10/13/23  4:31 PM  Result Value Ref Range   B Natriuretic Peptide 67.3 0.0 - 100.0 pg/mL  CBC with Differential     Status: Abnormal   Collection Time: 10/13/23  4:31 PM  Result Value Ref Range   WBC 11.4 (H) 4.0 - 10.5 K/uL   RBC 4.40 3.87 - 5.11 MIL/uL   Hemoglobin 13.0 12.0 - 15.0 g/dL   HCT 29.5 28.4 - 13.2 %   MCV 97.3 80.0 - 100.0 fL   MCH 29.5 26.0 - 34.0 pg   MCHC 30.4 30.0 - 36.0 g/dL   RDW 44.0 10.2 - 72.5 %   Platelets 246 150 - 400 K/uL   nRBC 0.0 0.0 - 0.2 %   Neutrophils Relative % 66 %   Neutro Abs 7.5 1.7  - 7.7 K/uL   Lymphocytes Relative 25 %   Lymphs Abs 2.9 0.7 - 4.0 K/uL   Monocytes Relative 7 %   Monocytes Absolute 0.8 0.1 - 1.0 K/uL   Eosinophils Relative 2 %   Eosinophils Absolute 0.2 0.0 - 0.5 K/uL   Basophils Relative 0 %   Basophils Absolute 0.1 0.0 - 0.1 K/uL   Immature Granulocytes 0 %   Abs Immature Granulocytes 0.04 0.00 - 0.07 K/uL  Troponin I (High Sensitivity)     Status: None   Collection Time: 10/13/23  4:31 PM  Result Value Ref Range   Troponin I (High Sensitivity) 3 <18 ng/L   Basic Metabolic Panel: Recent Labs  Lab 10/13/23 1631  NA 136  K 4.0  CL 96*  CO2 34*  GLUCOSE 222*  BUN 8  CREATININE 0.49  CALCIUM 8.7*   Liver Function Tests: No results for input(s): "AST", "ALT", "ALKPHOS", "BILITOT", "PROT", "ALBUMIN" in the last 168 hours. No results for input(s): "LIPASE", "AMYLASE" in the last 168 hours. No results for input(s): "AMMONIA" in the last 168 hours. CBC: Recent Labs  Lab 10/13/23 1631  WBC 11.4*  NEUTROABS 7.5  HGB 13.0  HCT 42.8  MCV 97.3  PLT 246   Cardiac Enzymes: Recent Labs  Lab 10/13/23 1631  TROPONINIHS 3    BNP (last 3 results) No results for input(s): "PROBNP" in the last 8760 hours. CBG: No results for input(s): "GLUCAP" in the last 168 hours.  Radiological Exams on Admission:  DG Chest 2 View Result Date: 10/13/2023 CLINICAL DATA:  Pt complaining SHOB, non-productive cough, nausea, and sinus pressure. Pt was seen 3x wks ago by her pulmonologist. Was prescribed prednisone, breathing treatments, and doxycyline. EXAM: CHEST - 2 VIEW COMPARISON:  Chest x-ray 04/08/2022 FINDINGS: The heart and mediastinal contours are unchanged. Hyperinflation of the lungs. No focal consolidation. No pulmonary edema. Nonspecific  blunting of bilateral costophrenic angles. No pleural effusion. No pneumothorax. No acute osseous abnormality. IMPRESSION: No active cardiopulmonary disease. Electronically Signed   By: Tish Frederickson M.D.   On:  10/13/2023 17:23    EKG: Independently reviewed. NSR   Assessment and Plan: * COPD (chronic obstructive pulmonary disease) (HCC) Patient chronically requiring 2 L/min of supplementary oxygen during periods of exertion intermittently.  Has had worsening shortness of breath for approximately 1 month with associated dry cough on and off anterior chest pain no leg swelling.  Occasional cramping of the legs.  Patient is felt to be in COPD exacerbation with poor response to outpatient therapy with prednisone taper and doxycycline.  Patient has received methylprednisolone as well as cefepime in the ER.  I will continue with 60 mg of methylprednisolone daily. GI ppx ordered.  Standing duoneb. Doxycycline and cefepime IV with peak flow monitoirng.  Will chek D-dimer, BNP to rule out alterantive etiologies, althoguht apteitn wheezes are marked and diffuse. Consideration of CT chest after D-dimer is back. Patinet noted tohave worsened metabolic alkalosis. Check VBG. Check resp viral panel  Leg cramp Check magnesium. USG t.r.o DVT  Mouth pain No lesion noticed where patient reported pain. Will treat with magic mouthwash and MVI  Type 2 diabetes mellitus without complication, with long-term current use of insulin (HCC) C/w glargine at home dose. Add insulin sliding scale. Hold farxiga for now.  DVT ppx ordered.    Advance Care Planning:   Code Status: Prior full code.  Consults: none at this time.  Family Communication: per patient.  Severity of Illness: The appropriate patient status for this patient is INPATIENT. Inpatient status is judged to be reasonable and necessary in order to provide the required intensity of service to ensure the patient's safety. The patient's presenting symptoms, physical exam findings, and initial radiographic and laboratory data in the context of their chronic comorbidities is felt to place them at high risk for further clinical deterioration. Furthermore, it is not  anticipated that the patient will be medically stable for discharge from the hospital within 2 midnights of admission.   * I certify that at the point of admission it is my clinical judgment that the patient will require inpatient hospital care spanning beyond 2 midnights from the point of admission due to high intensity of service, high risk for further deterioration and high frequency of surveillance required.*  Author: Nolberto Hanlon, MD 10/13/2023 9:12 PM  For on call review [email or web address].

## 2023-10-14 ENCOUNTER — Inpatient Hospital Stay (HOSPITAL_COMMUNITY): Payer: Medicare Other

## 2023-10-14 DIAGNOSIS — J449 Chronic obstructive pulmonary disease, unspecified: Secondary | ICD-10-CM

## 2023-10-14 DIAGNOSIS — Z794 Long term (current) use of insulin: Secondary | ICD-10-CM

## 2023-10-14 DIAGNOSIS — M79606 Pain in leg, unspecified: Secondary | ICD-10-CM | POA: Diagnosis not present

## 2023-10-14 DIAGNOSIS — K1379 Other lesions of oral mucosa: Secondary | ICD-10-CM

## 2023-10-14 DIAGNOSIS — E119 Type 2 diabetes mellitus without complications: Secondary | ICD-10-CM

## 2023-10-14 DIAGNOSIS — R252 Cramp and spasm: Secondary | ICD-10-CM

## 2023-10-14 DIAGNOSIS — J441 Chronic obstructive pulmonary disease with (acute) exacerbation: Secondary | ICD-10-CM | POA: Diagnosis not present

## 2023-10-14 LAB — RESPIRATORY PANEL BY PCR

## 2023-10-14 LAB — BLOOD GAS, VENOUS
Acid-Base Excess: 7.8 mmol/L — ABNORMAL HIGH (ref 0.0–2.0)
Bicarbonate: 35.9 mmol/L — ABNORMAL HIGH (ref 20.0–28.0)
Drawn by: 1434
O2 Saturation: 83 %
Patient temperature: 37.3
pCO2, Ven: 69 mm[Hg] — ABNORMAL HIGH (ref 44–60)
pH, Ven: 7.33 (ref 7.25–7.43)
pO2, Ven: 52 mm[Hg] — ABNORMAL HIGH (ref 32–45)

## 2023-10-14 LAB — HEPATIC FUNCTION PANEL
ALT: 20 U/L (ref 0–44)
AST: 15 U/L (ref 15–41)
Albumin: 3 g/dL — ABNORMAL LOW (ref 3.5–5.0)
Alkaline Phosphatase: 70 U/L (ref 38–126)
Bilirubin, Direct: 0.1 mg/dL (ref 0.0–0.2)
Indirect Bilirubin: 0.4 mg/dL (ref 0.3–0.9)
Total Bilirubin: 0.5 mg/dL (ref <1.2)
Total Protein: 6 g/dL — ABNORMAL LOW (ref 6.5–8.1)

## 2023-10-14 LAB — GLUCOSE, CAPILLARY
Glucose-Capillary: 437 mg/dL — ABNORMAL HIGH (ref 70–99)
Glucose-Capillary: 362 mg/dL — ABNORMAL HIGH (ref 70–99)
Glucose-Capillary: 311 mg/dL — ABNORMAL HIGH (ref 70–99)
Glucose-Capillary: 271 mg/dL — ABNORMAL HIGH (ref 70–99)

## 2023-10-14 LAB — PROTIME-INR
INR: 1 (ref 0.8–1.2)
Prothrombin Time: 13 s (ref 11.4–15.2)

## 2023-10-14 LAB — MRSA NEXT GEN BY PCR, NASAL: MRSA by PCR Next Gen: NOT DETECTED

## 2023-10-14 LAB — BASIC METABOLIC PANEL
Anion gap: 7 (ref 5–15)
BUN: 11 mg/dL (ref 8–23)
CO2: 28 mmol/L (ref 22–32)
Calcium: 8.1 mg/dL — ABNORMAL LOW (ref 8.9–10.3)
Chloride: 96 mmol/L — ABNORMAL LOW (ref 98–111)
Creatinine, Ser: 0.65 mg/dL (ref 0.44–1.00)
GFR, Estimated: >60 mL/min (ref >60)
Glucose, Bld: 249 mg/dL — ABNORMAL HIGH (ref 70–99)
Potassium: 4.1 mmol/L (ref 3.5–5.1)
Sodium: 131 mmol/L — ABNORMAL LOW (ref 135–145)

## 2023-10-14 LAB — CBC
HCT: 38.7 % (ref 36.0–46.0)
Hemoglobin: 12 g/dL (ref 12.0–15.0)
MCH: 29.9 pg (ref 26.0–34.0)
MCHC: 31 g/dL (ref 30.0–36.0)
MCV: 96.5 fL (ref 80.0–100.0)
Platelets: 215 10*3/uL (ref 150–400)
RBC: 4.01 MIL/uL (ref 3.87–5.11)
RDW: 14.6 % (ref 11.5–15.5)
WBC: 11.8 10*3/uL — ABNORMAL HIGH (ref 4.0–10.5)
nRBC: 0 % (ref 0.0–0.2)

## 2023-10-14 LAB — MAGNESIUM: Magnesium: 2 mg/dL (ref 1.7–2.4)

## 2023-10-14 LAB — BRAIN NATRIURETIC PEPTIDE: B Natriuretic Peptide: 46.3 pg/mL (ref 0.0–100.0)

## 2023-10-14 LAB — PROCALCITONIN: Procalcitonin: <0.1 ng/mL

## 2023-10-14 LAB — APTT: aPTT: 31 s (ref 24–36)

## 2023-10-14 LAB — HIV ANTIBODY (ROUTINE TESTING W REFLEX): HIV Screen 4th Generation wRfx: NONREACTIVE

## 2023-10-14 MED ORDER — ORAL CARE MOUTH RINSE
15.0000 mL | OROMUCOSAL | Status: DC
  Administered 2023-10-14 – 2023-10-16 (×8): 15 mL via OROMUCOSAL

## 2023-10-14 MED ORDER — INSULIN ASPART 100 UNIT/ML IJ SOLN
6.0000 [IU] | Freq: Once | INTRAMUSCULAR | Status: AC
  Administered 2023-10-14: 6 [IU] via SUBCUTANEOUS

## 2023-10-14 MED ORDER — SODIUM CHLORIDE 0.9 % IV SOLN
INTRAVENOUS | Status: AC | PRN
Start: 2023-10-14 — End: 2023-10-15

## 2023-10-14 MED ORDER — ALBUTEROL SULFATE (2.5 MG/3ML) 0.083% IN NEBU
2.5000 mg | INHALATION_SOLUTION | RESPIRATORY_TRACT | Status: DC
  Administered 2023-10-14 – 2023-10-15 (×9): 2.5 mg via RESPIRATORY_TRACT
  Filled 2023-10-14 (×9): qty 3

## 2023-10-14 MED ORDER — ORAL CARE MOUTH RINSE: 15.0000 mL | OROMUCOSAL | Status: DC | PRN

## 2023-10-14 MED ORDER — DOXYCYCLINE HYCLATE 100 MG PO TABS
100.0000 mg | ORAL_TABLET | Freq: Two times a day (BID) | ORAL | Status: DC
  Administered 2023-10-14 – 2023-10-16 (×5): 100 mg via ORAL
  Filled 2023-10-14 (×5): qty 1

## 2023-10-14 NOTE — Plan of Care (Signed)
  Problem: Nutritional: Goal: Maintenance of adequate nutrition will improve Outcome: Progressing   Problem: Tissue Perfusion: Goal: Adequacy of tissue perfusion will improve Outcome: Progressing   Problem: Health Behavior/Discharge Planning: Goal: Ability to manage health-related needs will improve Outcome: Progressing   Problem: Clinical Measurements: Goal: Will remain free from infection Outcome: Progressing   Problem: Activity: Goal: Risk for activity intolerance will decrease Outcome: Progressing   Problem: Nutrition: Goal: Adequate nutrition will be maintained Outcome: Progressing

## 2023-10-14 NOTE — Progress Notes (Signed)
PHARMACY NOTE -  Cefepime  Pharmacy has been assisting with dosing of cefepime for AECOPD. Dosage remains stable at 2g IV q12 hr and further renal adjustments per institutional Pharmacy antibiotic protocol Pharmacist (and ID Pharmacist) both recommend stopping abx given positive respiratory virus finding - MD plans to trend procalcitonin and will consider stopping as appropriate  Pharmacy will sign off, following peripherally for culture results, dose adjustments, and length of therapy. Please reconsult if a change in clinical status warrants re-evaluation of dosage.  Bernadene Person, PharmD, BCPS [phone] 10/14/2023, 1:06 PM

## 2023-10-14 NOTE — Plan of Care (Signed)
  Problem: Education: Goal: Ability to describe self-care measures that may prevent or decrease complications (Diabetes Survival Skills Education) will improve Outcome: Progressing   Problem: Coping: Goal: Ability to adjust to condition or change in health will improve Outcome: Progressing   Problem: Fluid Volume: Goal: Ability to maintain a balanced intake and output will improve Outcome: Progressing   Problem: Health Behavior/Discharge Planning: Goal: Ability to identify and utilize available resources and services will improve Outcome: Progressing Goal: Ability to manage health-related needs will improve Outcome: Progressing   Problem: Metabolic: Goal: Ability to maintain appropriate glucose levels will improve Outcome: Progressing   Problem: Nutritional: Goal: Maintenance of adequate nutrition will improve Outcome: Progressing Goal: Progress toward achieving an optimal weight will improve Outcome: Progressing   Problem: Skin Integrity: Goal: Risk for impaired skin integrity will decrease Outcome: Progressing   Problem: Tissue Perfusion: Goal: Adequacy of tissue perfusion will improve Outcome: Progressing   Problem: Education: Goal: Knowledge of General Education information will improve Description: Including pain rating scale, medication(s)/side effects and non-pharmacologic comfort measures Outcome: Progressing   Problem: Health Behavior/Discharge Planning: Goal: Ability to manage health-related needs will improve Outcome: Progressing   Problem: Clinical Measurements: Goal: Ability to maintain clinical measurements within normal limits will improve Outcome: Progressing Goal: Will remain free from infection Outcome: Progressing Goal: Diagnostic test results will improve Outcome: Progressing Goal: Respiratory complications will improve Outcome: Progressing Goal: Cardiovascular complication will be avoided Outcome: Progressing   Problem: Activity: Goal:  Risk for activity intolerance will decrease Outcome: Progressing   Problem: Nutrition: Goal: Adequate nutrition will be maintained Outcome: Progressing   Problem: Coping: Goal: Level of anxiety will decrease Outcome: Progressing   Problem: Elimination: Goal: Will not experience complications related to bowel motility Outcome: Progressing Goal: Will not experience complications related to urinary retention Outcome: Progressing   Problem: Pain Management: Goal: General experience of comfort will improve Outcome: Progressing   Problem: Safety: Goal: Ability to remain free from injury will improve Outcome: Progressing   Problem: Skin Integrity: Goal: Risk for impaired skin integrity will decrease Outcome: Progressing  Cindy S. Clelia Croft BSN, RN, CCRP, CCRN 10/14/2023 5:02 AM

## 2023-10-14 NOTE — Progress Notes (Signed)
RT has attempted Peak Flow with PT at each scheduled visit- PT unable due to coughing with forced exhalation.

## 2023-10-14 NOTE — Progress Notes (Signed)
Attempted to complete Peak Flow- not successful due to efforts making PT cough. RN aware.  PT has strong cough.

## 2023-10-14 NOTE — Progress Notes (Signed)
Bilateral lower extremity venous duplex has been completed. Preliminary results can be found in CV Proc through chart review.   10/14/23 11:08 AM Olen Cordial RVT

## 2023-10-14 NOTE — Progress Notes (Addendum)
PROGRESS NOTE    Patricia Kaiser  ION:[id number] DOB: 1956-05-30 DOA: 10/13/2023 PCP: Rebecka Apley, NP    Brief Narrative:    Patricia Kaiser is a 67 y.o. female with past medical history significant for COPD at 2 L of oxygen at baseline presented to hospital with cough shortness of breath and needing oxygen up to 5 L.  She also had week history of burning sensation in her tongue.  Patient follows up with pulmonology in [place].  She had recently completed course of prednisone and doxycycline but despite that her symptoms did not improve and had gone to her pulmonology office and was noted to have severe obstruction on spirometry today, sent to the ER for further evaluation.  In the ED, patient was on 5 L of oxygen.  Patient was put on BiPAP overnight then considered for admission to hospital for further evaluation and treatment.   Assessment and plan.   COPD (chronic obstructive pulmonary disease) with acute exacerbation.  Rhinovirus infection. Acute on chronic hypoxic respiratory failure. Required BiPAP overnight.  Continue continue doxycycline and Solu-Medrol.  Continue cefepime.  Respiratory viral panel positive for rhinovirus.  MRSA negative.  HIV nonreactive.  D-dimer was less than 0.2.  COVID negative.  BNP of 46.  Chest x-ray without any acute disease.  Currently on 4 L of oxygen by nasal cannula.  Continue BiPAP as needed.  Transferred to progressive care unit.  Will continue doxycycline for now.  DC cefepime.  Check procalcitonin.   Leg cramp Magnesium of 2.0.  Potassium 4.1.  Check vascular ultrasound of the lower extremities to rule out DVT.  Check BMP in AM.   Mouth pain Without discrete lesions.  Continue magic mouthwash and MVI   Type 2 diabetes mellitus without complication, with long-term current use of insulin  Continue glargine, added insulin sliding scale. Hold farxiga for now.  Continue to monitor blood glucose closely due to being on steroids.      DVT prophylaxis: enoxaparin (LOVENOX) injection 40 mg Start: 10/14/23 0800 SCDs Start: 10/13/23 2126   Code Status:     Code Status: Full Code  Disposition: Home likely in 1 to 2 days when clinically improved.  Status is: Inpatient  Remains inpatient appropriate because: COPD exacerbation requiring BiPAP, pending clinical improvement   Family Communication: None at bedside  Consultants:  None  Procedures:  None  Antimicrobials:  Doxycycline, will DC cefepime  Anti-infectives (From admission, onward)    Start     Dose/Rate Route Frequency Ordered Stop   10/14/23 1230  doxycycline (VIBRA-TABS) tablet 100 mg        100 mg Oral Every 12 hours 10/14/23 1134     10/13/23 2130  doxycycline (VIBRAMYCIN) 100 mg in dextrose 5 % 250 mL IVPB  Status:  Discontinued        100 mg 125 mL/hr over 120 Minutes Intravenous Every 12 hours 10/13/23 2121 10/14/23 1134   10/13/23 2045  ceFEPIme (MAXIPIME) 2 g in sodium chloride 0.9 % 100 mL IVPB  Status:  Discontinued        2 g 200 mL/hr over 30 Minutes Intravenous Every 12 hours 10/13/23 2039 10/14/23 1257        Subjective: Today, patient was seen and examined at bedside.  Patient complains of excessive coughing with dyspnea and shortness of breath.  Required BiPAP during the nighttime but needed some sedation due to claustrophobia.  Denies any fever chills or rigor but did have some chest pain from coughing.  Objective: Vitals:   10/14/23 0900 10/14/23 1000 10/14/23 1100 10/14/23 1225  BP: 118/80 (!) 123/47 (!) 109/44   Pulse: (!) 104 96 99   Resp: (!) 28 (!) 21    Temp:    98.2 F (36.8 C)  TempSrc:    Oral  SpO2: 93% 95% 96%   Weight:      Height:        Intake/Output Summary (Last 24 hours) at 10/14/2023 1257 Last data filed at 10/14/2023 1148 Gross per 24 hour  Intake 466 ml  Output 325 ml  Net 141 ml   Filed Weights   10/13/23 1615 10/14/23 0023 10/14/23 0400  Weight: 49.9 kg 51.2 kg 51.2 kg    Physical  Examination: Body mass index is 21.33 kg/m.   General:  Average built, not in obvious distress, on nasal cannula oxygen at 4 L/min. HENT:   No scleral pallor or icterus noted. Oral mucosa is moist.  Chest:   Diminished breath sounds bilaterally.  Bilateral expiratory wheezes noted. CVS: S1 &S2 heard. No murmur.  Regular rate and rhythm. Abdomen: Soft, nontender, nondistended.  Bowel sounds are heard.   Extremities: No cyanosis, clubbing or edema.  Peripheral pulses are palpable. Psych: Alert, awake and oriented, normal mood CNS:  No cranial nerve deficits.  Power equal in all extremities.   Skin: Warm and dry.  No rashes noted.  Data Reviewed:   CBC: Recent Labs  Lab 10/13/23 1631 10/13/23 2216 10/14/23 0307  WBC 11.4* 14.0* 11.8*  NEUTROABS 7.5  --   --   HGB 13.0 13.2 12.0  HCT 42.8 41.9 38.7  MCV 97.3 96.8 96.5  PLT 246 260 215    Basic Metabolic Panel: Recent Labs  Lab 10/13/23 1631 10/13/23 2216 10/14/23 0307  NA 136  --  131*  K 4.0  --  4.1  CL 96*  --  96*  CO2 34*  --  28  GLUCOSE 222*  --  249*  BUN 8  --  11  CREATININE 0.49 0.58 0.65  CALCIUM 8.7*  --  8.1*  MG  --   --  2.0    Liver Function Tests: Recent Labs  Lab 10/14/23 0307  AST 15  ALT 20  ALKPHOS 70  BILITOT 0.5  PROT 6.0*  ALBUMIN 3.0*     Radiology Studies: VAS [place] LOWER EXTREMITY VENOUS (DVT) Result Date: 10/14/2023  Lower Venous DVT Study Patient Name:  Patricia Kaiser  Date of Exam:   10/14/2023 Medical Rec #: [id number]         Accession #:    [id number] Date of Birth: [birth year]         Patient Gender: F Patient Age:   89 years Exam Location:  [facility] Procedure:      VAS [place] LOWER EXTREMITY VENOUS (DVT) Referring Phys: [facility] GOEL --------------------------------------------------------------------------------  Indications: Pain.  Risk Factors: None identified. Comparison Study: No prior studies. Performing Technologist: Chanda Busing RVT  Examination Guidelines: A  complete evaluation includes B-mode imaging, spectral Doppler, color Doppler, and power Doppler as needed of all accessible portions of each vessel. Bilateral testing is considered an integral part of a complete examination. Limited examinations for reoccurring indications may be performed as noted. The reflux portion of the exam is performed with the patient in reverse Trendelenburg.  +---------+---------------+---------+-----------+----------+--------------+ RIGHT    CompressibilityPhasicitySpontaneityPropertiesThrombus Aging +---------+---------------+---------+-----------+----------+--------------+ CFV      Full           Yes  Yes                                 +---------+---------------+---------+-----------+----------+--------------+ SFJ      Full                                                        +---------+---------------+---------+-----------+----------+--------------+ FV Prox  Full                                                        +---------+---------------+---------+-----------+----------+--------------+ FV Mid   Full                                                        +---------+---------------+---------+-----------+----------+--------------+ FV DistalFull                                                        +---------+---------------+---------+-----------+----------+--------------+ PFV      Full                                                        +---------+---------------+---------+-----------+----------+--------------+ POP      Full           Yes      Yes                                 +---------+---------------+---------+-----------+----------+--------------+ PTV      Full                                                        +---------+---------------+---------+-----------+----------+--------------+ PERO     Full                                                         +---------+---------------+---------+-----------+----------+--------------+   +---------+---------------+---------+-----------+----------+--------------+ LEFT     CompressibilityPhasicitySpontaneityPropertiesThrombus Aging +---------+---------------+---------+-----------+----------+--------------+ CFV      Full           Yes      No                                  +---------+---------------+---------+-----------+----------+--------------+ SFJ      Full                                                        +---------+---------------+---------+-----------+----------+--------------+  FV Prox  Full                                                        +---------+---------------+---------+-----------+----------+--------------+ FV Mid   Full                                                        +---------+---------------+---------+-----------+----------+--------------+ FV DistalFull                                                        +---------+---------------+---------+-----------+----------+--------------+ PFV      Full                                                        +---------+---------------+---------+-----------+----------+--------------+ POP      Full           Yes      Yes                                 +---------+---------------+---------+-----------+----------+--------------+ PTV      Full                                                        +---------+---------------+---------+-----------+----------+--------------+ PERO     Full                                                        +---------+---------------+---------+-----------+----------+--------------+    Summary: RIGHT: - There is no evidence of deep vein thrombosis in the lower extremity.  - No cystic structure found in the popliteal fossa.  LEFT: - There is no evidence of deep vein thrombosis in the lower extremity.  - No cystic structure found in the popliteal fossa.   *See table(s) above for measurements and observations.    Preliminary    DG Chest 2 View Result Date: 10/13/2023 CLINICAL DATA:  Pt complaining SHOB, non-productive cough, nausea, and sinus pressure. Pt was seen 3x wks ago by her pulmonologist. Was prescribed prednisone, breathing treatments, and doxycyline. EXAM: CHEST - 2 VIEW COMPARISON:  Chest x-ray 04/08/2022 FINDINGS: The heart and mediastinal contours are unchanged. Hyperinflation of the lungs. No focal consolidation. No pulmonary edema. Nonspecific blunting of bilateral costophrenic angles. No pleural effusion. No pneumothorax. No acute osseous abnormality. IMPRESSION: No active cardiopulmonary disease. Electronically Signed   By: Tish Frederickson M.D.   On: 10/13/2023 17:23      LOS: 1 day  Joycelyn Das, MD [facility] Available via Epic secure chat 7am-7pm After these hours, please refer to coverage provider listed on [email or web address] 10/14/2023, 12:57 PM

## 2023-10-15 DIAGNOSIS — J449 Chronic obstructive pulmonary disease, unspecified: Secondary | ICD-10-CM | POA: Diagnosis not present

## 2023-10-15 DIAGNOSIS — B348 Other viral infections of unspecified site: Secondary | ICD-10-CM | POA: Insufficient documentation

## 2023-10-15 LAB — CBC
HCT: 35.9 % — ABNORMAL LOW (ref 36.0–46.0)
Hemoglobin: 11.2 g/dL — ABNORMAL LOW (ref 12.0–15.0)
MCH: 30.1 pg (ref 26.0–34.0)
MCHC: 31.2 g/dL (ref 30.0–36.0)
MCV: 96.5 fL (ref 80.0–100.0)
Platelets: 231 10*3/uL (ref 150–400)
RBC: 3.72 MIL/uL — ABNORMAL LOW (ref 3.87–5.11)
RDW: 14.9 % (ref 11.5–15.5)
WBC: 13.2 10*3/uL — ABNORMAL HIGH (ref 4.0–10.5)
nRBC: 0 % (ref 0.0–0.2)

## 2023-10-15 LAB — BASIC METABOLIC PANEL
Anion gap: 8 (ref 5–15)
BUN: 20 mg/dL (ref 8–23)
CO2: 30 mmol/L (ref 22–32)
Calcium: 8.5 mg/dL — ABNORMAL LOW (ref 8.9–10.3)
Chloride: 99 mmol/L (ref 98–111)
Creatinine, Ser: 0.61 mg/dL (ref 0.44–1.00)
GFR, Estimated: >60 mL/min (ref >60)
Glucose, Bld: 186 mg/dL — ABNORMAL HIGH (ref 70–99)
Potassium: 4.3 mmol/L (ref 3.5–5.1)
Sodium: 137 mmol/L (ref 135–145)

## 2023-10-15 LAB — GLUCOSE, CAPILLARY
Glucose-Capillary: 425 mg/dL — ABNORMAL HIGH (ref 70–99)
Glucose-Capillary: 524 mg/dL (ref 70–99)
Glucose-Capillary: 92 mg/dL (ref 70–99)
Glucose-Capillary: 314 mg/dL — ABNORMAL HIGH (ref 70–99)

## 2023-10-15 LAB — GLUCOSE, RANDOM
Glucose, Bld: 549 mg/dL (ref 70–99)
Glucose, Bld: 402 mg/dL — ABNORMAL HIGH (ref 70–99)

## 2023-10-15 LAB — MAGNESIUM: Magnesium: 1.9 mg/dL (ref 1.7–2.4)

## 2023-10-15 LAB — HEMOGLOBIN A1C
Hgb A1c MFr Bld: 9.9 % — ABNORMAL HIGH (ref 4.8–5.6)
Mean Plasma Glucose: 237 mg/dL

## 2023-10-15 MED ORDER — HYDROCOD POLI-CHLORPHE POLI ER 10-8 MG/5ML PO SUER
5.0000 mL | Freq: Two times a day (BID) | ORAL | Status: DC
  Administered 2023-10-15 – 2023-10-16 (×3): 5 mL via ORAL
  Filled 2023-10-15 (×3): qty 5

## 2023-10-15 MED ORDER — INSULIN ASPART 100 UNIT/ML IJ SOLN
2.0000 [IU] | Freq: Three times a day (TID) | INTRAMUSCULAR | Status: DC
  Administered 2023-10-15 – 2023-10-16 (×3): 2 [IU] via SUBCUTANEOUS

## 2023-10-15 MED ORDER — ALPRAZOLAM 0.25 MG PO TABS
0.2500 mg | ORAL_TABLET | Freq: Three times a day (TID) | ORAL | Status: DC | PRN
  Administered 2023-10-15: 0.25 mg via ORAL
  Filled 2023-10-15: qty 1

## 2023-10-15 MED ORDER — INSULIN ASPART 100 UNIT/ML IJ SOLN: 0.0000 [IU] | Freq: Every day | INTRAMUSCULAR | Status: DC

## 2023-10-15 MED ORDER — NYSTATIN 100000 UNIT/ML MT SUSP
5.0000 mL | Freq: Four times a day (QID) | OROMUCOSAL | Status: DC
  Administered 2023-10-15 – 2023-10-16 (×3): 500000 [IU] via ORAL
  Filled 2023-10-15 (×3): qty 5

## 2023-10-15 MED ORDER — INSULIN ASPART 100 UNIT/ML IJ SOLN
0.0000 [IU] | Freq: Three times a day (TID) | INTRAMUSCULAR | Status: DC
  Administered 2023-10-16 (×2): 7 [IU] via SUBCUTANEOUS

## 2023-10-15 MED ORDER — INSULIN ASPART 100 UNIT/ML IJ SOLN
6.0000 [IU] | Freq: Once | INTRAMUSCULAR | Status: AC
  Administered 2023-10-15: 6 [IU] via SUBCUTANEOUS

## 2023-10-15 MED ORDER — DEXTROMETHORPHAN POLISTIREX ER 30 MG/5ML PO SUER
30.0000 mg | Freq: Once | ORAL | Status: DC
  Filled 2023-10-15: qty 5

## 2023-10-15 MED ORDER — INSULIN ASPART 100 UNIT/ML IJ SOLN
20.0000 [IU] | Freq: Once | INTRAMUSCULAR | Status: AC
  Administered 2023-10-15: 20 [IU] via SUBCUTANEOUS

## 2023-10-15 NOTE — Progress Notes (Signed)
Patient was heard coughing real hard. This nurse checked and patient had wheezes all over her lungs and 02 sat was 95%. Patient was book back up to oxygen per nasal cannula for comfort at 2lpm. Liana Crocker NP was made aware of this too. Breathing treatment, early dose of steroid and cough syrup were given. Patient verbalized that she had some relief from coughing.Vitals were taken and charted.

## 2023-10-15 NOTE — Progress Notes (Signed)
Patient was seen outside by security and was escorted back to her room. Reiterated to patient that she's not allowed to leave the floor. Patient agreed to remain in her room.

## 2023-10-15 NOTE — Progress Notes (Signed)
PROGRESS NOTE    Patricia Kaiser  XBM:[id number] DOB: October 24, 1955 DOA: 10/13/2023 PCP: Patricia Apley, NP    Brief Narrative:    Patricia Kaiser is a 68 y.o. female with past medical history significant for COPD at 2 L of oxygen at baseline presented to hospital with cough shortness of breath and needing oxygen up to 5 L.  She also had week history of burning sensation in her tongue.  Patient follows up with pulmonology in [place].  She had recently completed course of prednisone and doxycycline but despite that her symptoms did not improve and had gone to her pulmonology office and was noted to have severe obstruction on spirometry today, sent to the ER for further evaluation.  In the ED, patient was on 5 L of oxygen.  Patient was put on BiPAP overnight then considered for admission to hospital for further evaluation and treatment.  Assessment and plan.   COPD (chronic obstructive pulmonary disease) with acute exacerbation.  Rhinovirus infection. Acute on chronic hypoxic respiratory failure. Initially required BiPAP overnight.  Has refused BiPAP overnight due to claustrophobia but states that she could not sleep and had a rough night.  Continue continue doxycycline and Solu-Medrol.    Respiratory viral panel positive for rhinovirus.  MRSA negative.  HIV nonreactive.  D-dimer was less than 0.2.  COVID negative.  BNP of 46.  Procalcitonin less than 0.1 chest x-ray without any acute disease.  Currently on 2 L of oxygen by nasal cannula.  Continue BiPAP as needed especially at nighttime agreeable to wait if she received some sort of sedation.  Will put on Xanax as needed as needed low-dose.  Leg cramp Improved.  Magnesium of 1.9 today.  Potassium 4.3.  Negative vascular ultrasound of the lower extremities.  Check BMP in AM.   Mouth pain Without discrete lesions.  Continue magic mouthwash and MVI   Type 2 diabetes mellitus without complication, with long-term current use of insulin   Continue glargine, insulin sliding scale. Hold farxiga for now.  Continue to monitor blood glucose closely due to being on steroids.     DVT prophylaxis: enoxaparin (LOVENOX) injection 40 mg Start: 10/14/23 0800 SCDs Start: 10/13/23 2126   Code Status:     Code Status: Full Code  Disposition: Home likely 10/16/2023 if continues to clinically improve.  Status is: Inpatient  Remains inpatient appropriate because: COPD exacerbation requiring BiPAP, pending clinical improvement   Family Communication: Spoke with the patient's fianc at bedside  Consultants:  None  Procedures:  None  Antimicrobials:  Doxycycline  Anti-infectives (From admission, onward)    Start     Dose/Rate Route Frequency Ordered Stop   10/14/23 1230  doxycycline (VIBRA-TABS) tablet 100 mg        100 mg Oral Every 12 hours 10/14/23 1134     10/13/23 2130  doxycycline (VIBRAMYCIN) 100 mg in dextrose 5 % 250 mL IVPB  Status:  Discontinued        100 mg 125 mL/hr over 120 Minutes Intravenous Every 12 hours 10/13/23 2121 10/14/23 1134   10/13/23 2045  ceFEPIme (MAXIPIME) 2 g in sodium chloride 0.9 % 100 mL IVPB  Status:  Discontinued        2 g 200 mL/hr over 30 Minutes Intravenous Every 12 hours 10/13/23 2039 10/14/23 1257        Subjective: Today, patient was seen and examined at bedside.  Patient stated that she did have a rough night and could not sleep well.  Had  been having spells of coughing and wheezing.  She stated her BiPAP helped her but she is very claustrophobic.  Advised her to use some antianxiety medicine prior to using it if needed.    Objective: Vitals:   10/14/23 2140 10/15/23 0102 10/15/23 0413 10/15/23 0906  BP: (!) 121/53 123/66 115/63   Pulse: 98 94 96   Resp:   (!) 21   Temp: 98.2 F (36.8 C) 98.2 F (36.8 C) 98.4 F (36.9 C)   TempSrc: Oral Oral Oral   SpO2: 92% 90% 95% 97%  Weight:      Height:        Intake/Output Summary (Last 24 hours) at 10/15/2023 0955 Last  data filed at 10/15/2023 0000 Gross per 24 hour  Intake 366 ml  Output --  Net 366 ml   Filed Weights   10/13/23 1615 10/14/23 0023 10/14/23 0400  Weight: 49.9 kg 51.2 kg 51.2 kg    Physical Examination: Body mass index is 21.33 kg/m.   General:  Average built, not in obvious distress, on nasal cannula oxygen at 2 L/min. HENT:   No scleral pallor or icterus noted. Oral mucosa is moist.  Chest:   Diminished breath sounds bilaterally.  Coarse breath sounds noted with mild wheezes. CVS: S1 &S2 heard. No murmur.  Regular rate and rhythm. Abdomen: Soft, nontender, nondistended.  Bowel sounds are heard.   Extremities: No cyanosis, clubbing or edema.  Peripheral pulses are palpable. Psych: Alert, awake and oriented, normal mood CNS:  No cranial nerve deficits.  Power equal in all extremities.   Skin: Warm and dry.  No rashes noted.  Data Reviewed:   CBC: Recent Labs  Lab 10/13/23 1631 10/13/23 2216 10/14/23 0307 10/15/23 0526  WBC 11.4* 14.0* 11.8* 13.2*  NEUTROABS 7.5  --   --   --   HGB 13.0 13.2 12.0 11.2*  HCT 42.8 41.9 38.7 35.9*  MCV 97.3 96.8 96.5 96.5  PLT 246 260 215 231    Basic Metabolic Panel: Recent Labs  Lab 10/13/23 1631 10/13/23 2216 10/14/23 0307 10/15/23 0526  NA 136  --  131* 137  K 4.0  --  4.1 4.3  CL 96*  --  96* 99  CO2 34*  --  28 30  GLUCOSE 222*  --  249* 186*  BUN 8  --  11 20  CREATININE 0.49 0.58 0.65 0.61  CALCIUM 8.7*  --  8.1* 8.5*  MG  --   --  2.0 1.9    Liver Function Tests: Recent Labs  Lab 10/14/23 0307  AST 15  ALT 20  ALKPHOS 70  BILITOT 0.5  PROT 6.0*  ALBUMIN 3.0*     Radiology Studies: VAS [place] LOWER EXTREMITY VENOUS (DVT) Result Date: 10/14/2023  Lower Venous DVT Study Patient Name:  Patricia Kaiser  Date of Exam:   10/14/2023 Medical Rec #: [id number]         Accession #:    [id number] Date of Birth: Dec 02, 1955         Patient Gender: F Patient Age:   69 years Exam Location:  [facility] Procedure:       VAS [place] LOWER EXTREMITY VENOUS (DVT) Referring Phys: [facility] GOEL --------------------------------------------------------------------------------  Indications: Pain.  Risk Factors: None identified. Comparison Study: No prior studies. Performing Technologist: Chanda Busing RVT  Examination Guidelines: A complete evaluation includes B-mode imaging, spectral Doppler, color Doppler, and power Doppler as needed of all accessible portions of each vessel. Bilateral testing is  considered an integral part of a complete examination. Limited examinations for reoccurring indications may be performed as noted. The reflux portion of the exam is performed with the patient in reverse Trendelenburg.  +---------+---------------+---------+-----------+----------+--------------+ RIGHT    CompressibilityPhasicitySpontaneityPropertiesThrombus Aging +---------+---------------+---------+-----------+----------+--------------+ CFV      Full           Yes      Yes                                 +---------+---------------+---------+-----------+----------+--------------+ SFJ      Full                                                        +---------+---------------+---------+-----------+----------+--------------+ FV Prox  Full                                                        +---------+---------------+---------+-----------+----------+--------------+ FV Mid   Full                                                        +---------+---------------+---------+-----------+----------+--------------+ FV DistalFull                                                        +---------+---------------+---------+-----------+----------+--------------+ PFV      Full                                                        +---------+---------------+---------+-----------+----------+--------------+ POP      Full           Yes      Yes                                  +---------+---------------+---------+-----------+----------+--------------+ PTV      Full                                                        +---------+---------------+---------+-----------+----------+--------------+ PERO     Full                                                        +---------+---------------+---------+-----------+----------+--------------+   +---------+---------------+---------+-----------+----------+--------------+ LEFT     CompressibilityPhasicitySpontaneityPropertiesThrombus Aging +---------+---------------+---------+-----------+----------+--------------+ CFV  Full           Yes      No                                  +---------+---------------+---------+-----------+----------+--------------+ SFJ      Full                                                        +---------+---------------+---------+-----------+----------+--------------+ FV Prox  Full                                                        +---------+---------------+---------+-----------+----------+--------------+ FV Mid   Full                                                        +---------+---------------+---------+-----------+----------+--------------+ FV DistalFull                                                        +---------+---------------+---------+-----------+----------+--------------+ PFV      Full                                                        +---------+---------------+---------+-----------+----------+--------------+ POP      Full           Yes      Yes                                 +---------+---------------+---------+-----------+----------+--------------+ PTV      Full                                                        +---------+---------------+---------+-----------+----------+--------------+ PERO     Full                                                         +---------+---------------+---------+-----------+----------+--------------+     Summary: RIGHT: - There is no evidence of deep vein thrombosis in the lower extremity.  - No cystic structure found in the popliteal fossa.  LEFT: - There is no evidence of deep vein thrombosis in the lower extremity.  - No cystic structure found in the popliteal fossa.  *See table(s) above for measurements  and observations. Electronically signed by Heath Lark on 10/14/2023 at 9:18:15 PM.    Final    DG Chest 2 View Result Date: 10/13/2023 CLINICAL DATA:  Pt complaining SHOB, non-productive cough, nausea, and sinus pressure. Pt was seen 3x wks ago by her pulmonologist. Was prescribed prednisone, breathing treatments, and doxycyline. EXAM: CHEST - 2 VIEW COMPARISON:  Chest x-ray 04/08/2022 FINDINGS: The heart and mediastinal contours are unchanged. Hyperinflation of the lungs. No focal consolidation. No pulmonary edema. Nonspecific blunting of bilateral costophrenic angles. No pleural effusion. No pneumothorax. No acute osseous abnormality. IMPRESSION: No active cardiopulmonary disease. Electronically Signed   By: Tish Frederickson M.D.   On: 10/13/2023 17:23      LOS: 2 days    Joycelyn Das, MD [facility] Available via Epic secure chat 7am-7pm After these hours, please refer to coverage provider listed on [email or web address] 10/15/2023, 9:55 AM

## 2023-10-15 NOTE — Progress Notes (Signed)
Mobility Specialist - Progress Note   10/15/23 1001  Oxygen Therapy  SpO2 95 %  O2 Device Nasal Cannula  O2 Flow Rate (L/min) 2 L/min  Patient Activity (if Appropriate) Ambulating  Mobility  Activity Ambulated independently in hallway  Level of Assistance Independent  Assistive Device None  Distance Ambulated (ft) 500 ft  Activity Response Tolerated well  Mobility Referral Yes  Mobility visit 1 Mobility  Mobility Specialist Start Time (ACUTE ONLY) [id number]  Mobility Specialist Stop Time (ACUTE ONLY) 1004  Mobility Specialist Time Calculation (min) (ACUTE ONLY) 27 min   Pt received in bed and agreeable to mobility. Pt took x1 standing rest break d/t SOB. SpO2 95-97% throughout session. No complaints during session. Pt to bed after session with all needs met.    [facility]

## 2023-10-15 NOTE — Plan of Care (Signed)
Cont with plan of care

## 2023-10-16 DIAGNOSIS — J449 Chronic obstructive pulmonary disease, unspecified: Secondary | ICD-10-CM | POA: Diagnosis not present

## 2023-10-16 LAB — GLUCOSE, CAPILLARY
Glucose-Capillary: 224 mg/dL — ABNORMAL HIGH (ref 70–99)
Glucose-Capillary: 237 mg/dL — ABNORMAL HIGH (ref 70–99)

## 2023-10-16 MED ORDER — NYSTATIN 100000 UNIT/ML MT SUSP: 5.0000 mL | Freq: Four times a day (QID) | OROMUCOSAL | 0 refills | Status: AC

## 2023-10-16 MED ORDER — ALBUTEROL SULFATE (2.5 MG/3ML) 0.083% IN NEBU
2.5000 mg | INHALATION_SOLUTION | Freq: Three times a day (TID) | RESPIRATORY_TRACT | Status: DC
  Filled 2023-10-16: qty 3

## 2023-10-16 MED ORDER — IPRATROPIUM-ALBUTEROL 0.5-2.5 (3) MG/3ML IN SOLN: 3.0000 mL | RESPIRATORY_TRACT | 0 refills | Status: AC | PRN

## 2023-10-16 MED ORDER — INSULIN ASPART 100 UNIT/ML IJ SOLN
6.0000 [IU] | Freq: Three times a day (TID) | INTRAMUSCULAR | Status: DC
  Administered 2023-10-16: 6 [IU] via SUBCUTANEOUS

## 2023-10-16 MED ORDER — HYDROCODONE BIT-HOMATROP MBR 5-1.5 MG/5ML PO SOLN: 5.0000 mL | Freq: Four times a day (QID) | ORAL | 0 refills | Status: AC | PRN

## 2023-10-16 MED ORDER — BENZONATATE 100 MG PO CAPS: 100.0000 mg | ORAL_CAPSULE | Freq: Three times a day (TID) | ORAL | 0 refills | Status: AC | PRN

## 2023-10-16 MED ORDER — METHYLPREDNISOLONE SODIUM SUCC 40 MG IJ SOLR: 40.0000 mg | INTRAMUSCULAR | Status: DC

## 2023-10-16 MED ORDER — GUAIFENESIN ER 600 MG PO TB12: 600.0000 mg | ORAL_TABLET | Freq: Two times a day (BID) | ORAL | 2 refills | Status: AC

## 2023-10-16 MED ORDER — INSULIN GLARGINE-YFGN 100 UNIT/ML ~~LOC~~ SOLN
30.0000 [IU] | Freq: Every day | SUBCUTANEOUS | Status: DC
  Administered 2023-10-16: 30 [IU] via SUBCUTANEOUS
  Filled 2023-10-16: qty 0.3

## 2023-10-16 MED ORDER — SEMGLEE (YFGN) 100 UNIT/ML ~~LOC~~ SOPN: 30.0000 [IU] | PEN_INJECTOR | Freq: Every morning | SUBCUTANEOUS | Status: AC

## 2023-10-16 MED ORDER — PHENOL 1.4 % MT LIQD
1.0000 | OROMUCOSAL | Status: DC | PRN
  Filled 2023-10-16: qty 177

## 2023-10-16 MED ORDER — PREDNISONE 10 MG PO TABS: ORAL_TABLET | ORAL | 0 refills | Status: DC

## 2023-10-16 MED ORDER — BUSPIRONE HCL 5 MG PO TABS: 5.0000 mg | ORAL_TABLET | Freq: Three times a day (TID) | ORAL | 0 refills | Status: AC

## 2023-10-16 MED ORDER — DEXTROMETHORPHAN POLISTIREX ER 30 MG/5ML PO SUER: 30.0000 mg | Freq: Two times a day (BID) | ORAL | 0 refills | Status: AC

## 2023-10-16 NOTE — Progress Notes (Signed)
RT attempted peak flow with the patient. Due to coughing patient was only able to reach 90 using the peak flow device.

## 2023-10-16 NOTE — TOC Initial Note (Signed)
Transition of Care [facility]) - Initial/Assessment Note    Patient Details  Name: Patricia Kaiser MRN: [id number] Date of Birth: 1956/06/07  Transition of Care [facility]) CM/SW Contact:    Adrian Prows, RN Phone Number: 10/16/2023, 12:31 PM  Clinical Narrative:                 Patricia Kaiser w/ pt and S.O. Marcial Pacas Friddle in room; pt says she lives at home; she plans to return at d/c; pt verified she has insurance/PCP; her S.O. will provide transportation; pt denies SDOH risks; pt says she does not have DME or HH services; her home oxygen is w/ [employer]; pt says she wants to this service; pt says she has a full travel tank; Mitch at [employer] notified; awaiting amb sat note; no TOC needs.  Expected Discharge Plan: Home/Self Care Barriers to Discharge: No Barriers Identified   Patient Goals and CMS Choice Patient states their goals for this hospitalization and ongoing recovery are:: home CMS Medicare.gov Compare Post Acute Care list provided to:: Patient   [place] ownership interest in [facility].provided to:: Patient    Expected Discharge Plan and Services   Discharge Planning Services: CM Consult Post Acute Care Choice: Resumption of Svcs/PTA Provider (home oxygen w/ [employer]) Living arrangements for the past 2 months: Mobile Home Expected Discharge Date: 10/16/23               DME Arranged: N/A DME Agency: NA       HH Arranged: NA HH Agency: NA        Prior Living Arrangements/Services Living arrangements for the past 2 months: Mobile Home Lives with:: Significant Other Patient language and need for interpreter reviewed:: Yes Do you feel safe going back to the place where you live?: Yes      Need for Family Participation in Patient Care: Yes (Comment) Care giver support system in place?: Yes (comment) Current home services: DME (home oxygen) Criminal Activity/Legal Involvement Pertinent to Current Situation/Hospitalization: No - Comment as needed  Activities of Daily  Living   ADL Screening (condition at time of admission) Independently performs ADLs?: Yes (appropriate for developmental age) Is the patient deaf or have difficulty hearing?: No Does the patient have difficulty seeing, even when wearing glasses/contacts?: No Does the patient have difficulty concentrating, remembering, or making decisions?: No  Permission Sought/Granted Permission sought to share information with : Case Manager Permission granted to share information with : Yes, Verbal Permission Granted  Share Information with NAME: Case Manager     Permission granted to share info w Relationship: Guy Begin (S.O.) [phone]     Emotional Assessment Appearance:: Appears stated age Attitude/Demeanor/Rapport: Gracious Affect (typically observed): Accepting Orientation: : Oriented to Self, Oriented to Place, Oriented to  Time, Oriented to Situation Alcohol / Substance Use: Not Applicable Psych Involvement: No (comment)  Admission diagnosis:  COPD exacerbation (HCC) [J44.1] Patient Active Problem List   Diagnosis Date Noted   Rhinovirus infection 10/15/2023   Mouth pain 10/13/2023   Leg cramp 10/13/2023   COPD with acute exacerbation (HCC) 04/08/2022   Acute on chronic respiratory failure with hypoxia (HCC) 12/10/2021   Uncontrolled type 2 diabetes mellitus with hyperglycemia, with long-term current use of insulin (HCC) 12/10/2021   UTI (urinary tract infection) 05/18/2019   Sepsis secondary to UTI (HCC) 05/17/2019   Non-insulin dependent type 2 diabetes mellitus (HCC)    Type 2 diabetes mellitus with hypoglycemia without coma (HCC)    Pyelonephritis    Type 2 diabetes  mellitus without complication, with long-term current use of insulin (HCC) 07/27/2016   COPD exacerbation (HCC) 10/22/2012   Hyperlipemia 10/22/2012   Tobacco use 10/22/2012   Hypokalemia 10/22/2012   Hypertension    COPD (chronic obstructive pulmonary disease) (HCC)    PCP:  Rebecka Apley,  NP Pharmacy:   [phone] - [place], [place] - [address] [place] [address] Phone: [phone] Fax: [phone]     Social Drivers of Health (SDOH) Social History: SDOH Screenings   Food Insecurity: No Food Insecurity (10/16/2023)  Housing: Low Risk  (10/16/2023)  Transportation Needs: No Transportation Needs (10/16/2023)  Utilities: Not At Risk (10/16/2023)  Financial Resource Strain: Low Risk  (09/21/2023)   Received from [facility]  Physical Activity: Insufficiently Active (09/21/2023)   Received from [facility]  Social Connections: Socially Integrated (09/21/2023)   Received from [facility]  Stress: Stress Concern Present (09/21/2023)   Received from [facility]  Tobacco Use: High Risk (10/13/2023)   SDOH Interventions: Food Insecurity Interventions: Intervention Not Indicated, Inpatient TOC Housing Interventions: Intervention Not Indicated, Inpatient TOC Transportation Interventions: Intervention Not Indicated, Inpatient TOC Utilities Interventions: Intervention Not Indicated, Inpatient TOC   Readmission Risk Interventions    10/16/2023   12:29 PM  Readmission Risk Prevention Plan  Transportation Screening Complete  PCP or Specialist Appt within 5-7 Days Complete  Home Care Screening Complete  Medication Review (RN CM) Complete

## 2023-10-16 NOTE — Progress Notes (Signed)
MEWS Progress Note  Patient Details Name: Patricia Kaiser MRN: [id number] DOB: 09-Jul-1956 Today's Date: 10/16/2023   MEWS Flowsheet Documentation:  Assess: MEWS Score Temp: 97.7 F (36.5 C) BP: (!) 104/50 MAP (mmHg): 66 Pulse Rate: 91 ECG Heart Rate: 94 Resp: 20 Level of Consciousness: Alert SpO2: 100 % O2 Device: Nasal Cannula Patient Activity (if Appropriate): Ambulating O2 Flow Rate (L/min): 2 L/min FiO2 (%): 40 % Assess: MEWS Score MEWS Temp: 0 MEWS Systolic: 0 MEWS Pulse: 0 MEWS RR: 0 MEWS LOC: 0 MEWS Score: 0 MEWS Score Color: Green Assess: SIRS CRITERIA SIRS Temperature : 0 SIRS Respirations : 0 SIRS Pulse: 1 SIRS WBC: 0 SIRS Score Sum : 1 SIRS Temperature : 0 SIRS Pulse: 1 SIRS Respirations : 0 SIRS WBC: 0 SIRS Score Sum : 1 Assess: if the MEWS score is Yellow or Red Were vital signs accurate and taken at a resting state?: No, vital signs rechecked Does the patient meet 2 or more of the SIRS criteria?: No MEWS guidelines implemented : No, previously yellow, continue vital signs every 4 hours Notify: Charge Nurse/RN Name of Charge Nurse/RN Notified: n/a resolved Provider Notification Provider Name/Title: n/a resolved Notify: Rapid Response Name of Rapid Response RN Notified: n/a   PATIENT VITAL SIGNS ELEVATED DUE TO CONTINUAL COUGHING FOLLOWING AMBULATION. vITALS RE-CHECKED ONCE PATIENT RELAXED AND WNL   Eddie Candle 10/16/2023, 11:48 AM

## 2023-10-16 NOTE — Progress Notes (Signed)
SATURATION QUALIFICATIONS: (This note is used to comply with regulatory documentation for home oxygen)  Patient Saturations on Room Air at Rest = 89%  Patient Saturations on Room Air while Ambulating = 88%  Patient Saturations on 3 Liters of oxygen while Ambulating = 94%  Please briefly explain why patient needs home oxygen:Pt wears 2L [name] at home at baseline. PT has chronic progressive medical condition.

## 2023-10-27 IMAGING — DX DG CHEST 2V
2 series · 2 of 2 positions shown · non-contrast
Comparison: 05/20/2019.

CLINICAL DATA: Nonproductive cough and shortness of breath for 2
weeks.

EXAM:
CHEST - 2 VIEW

[chest pa]
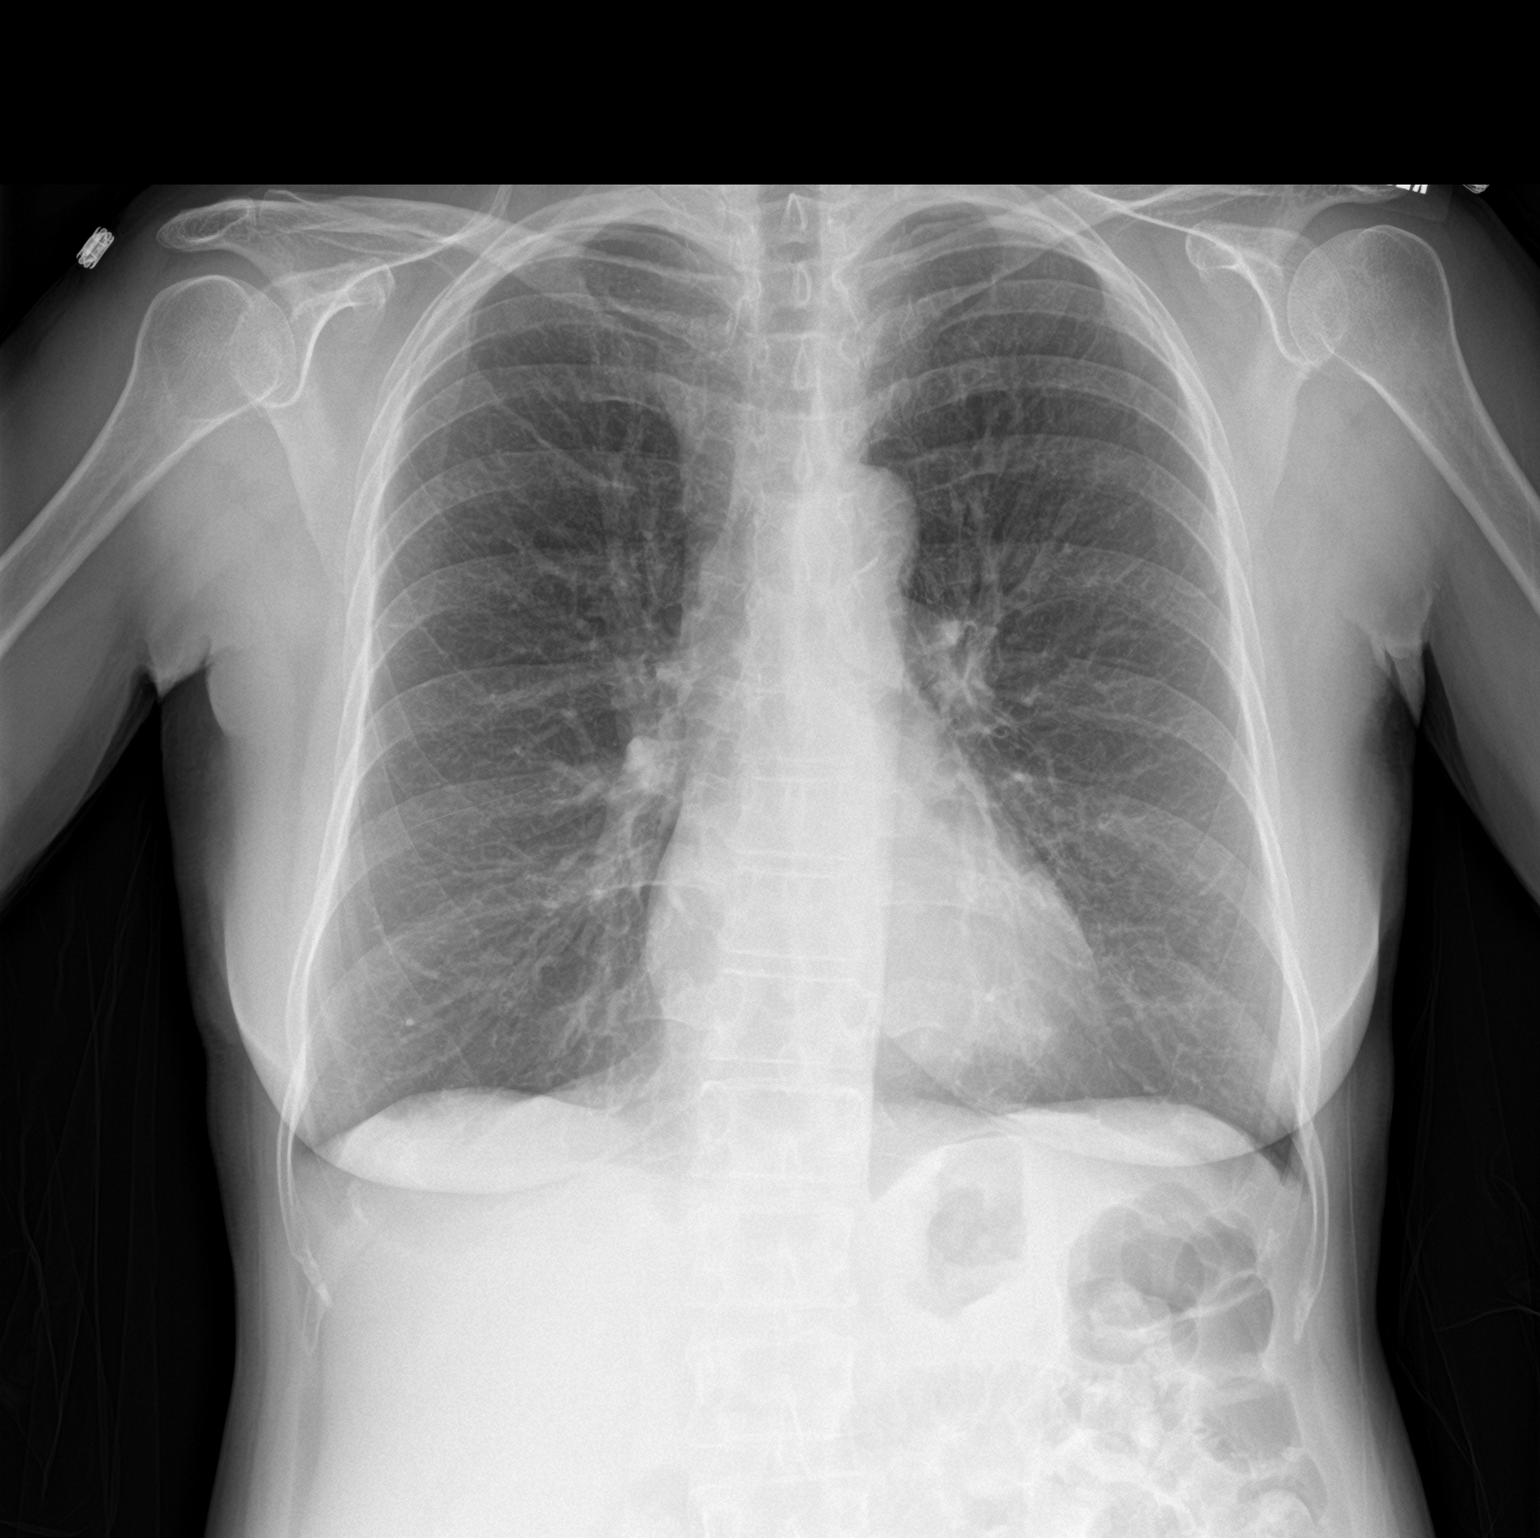

[chest lat]
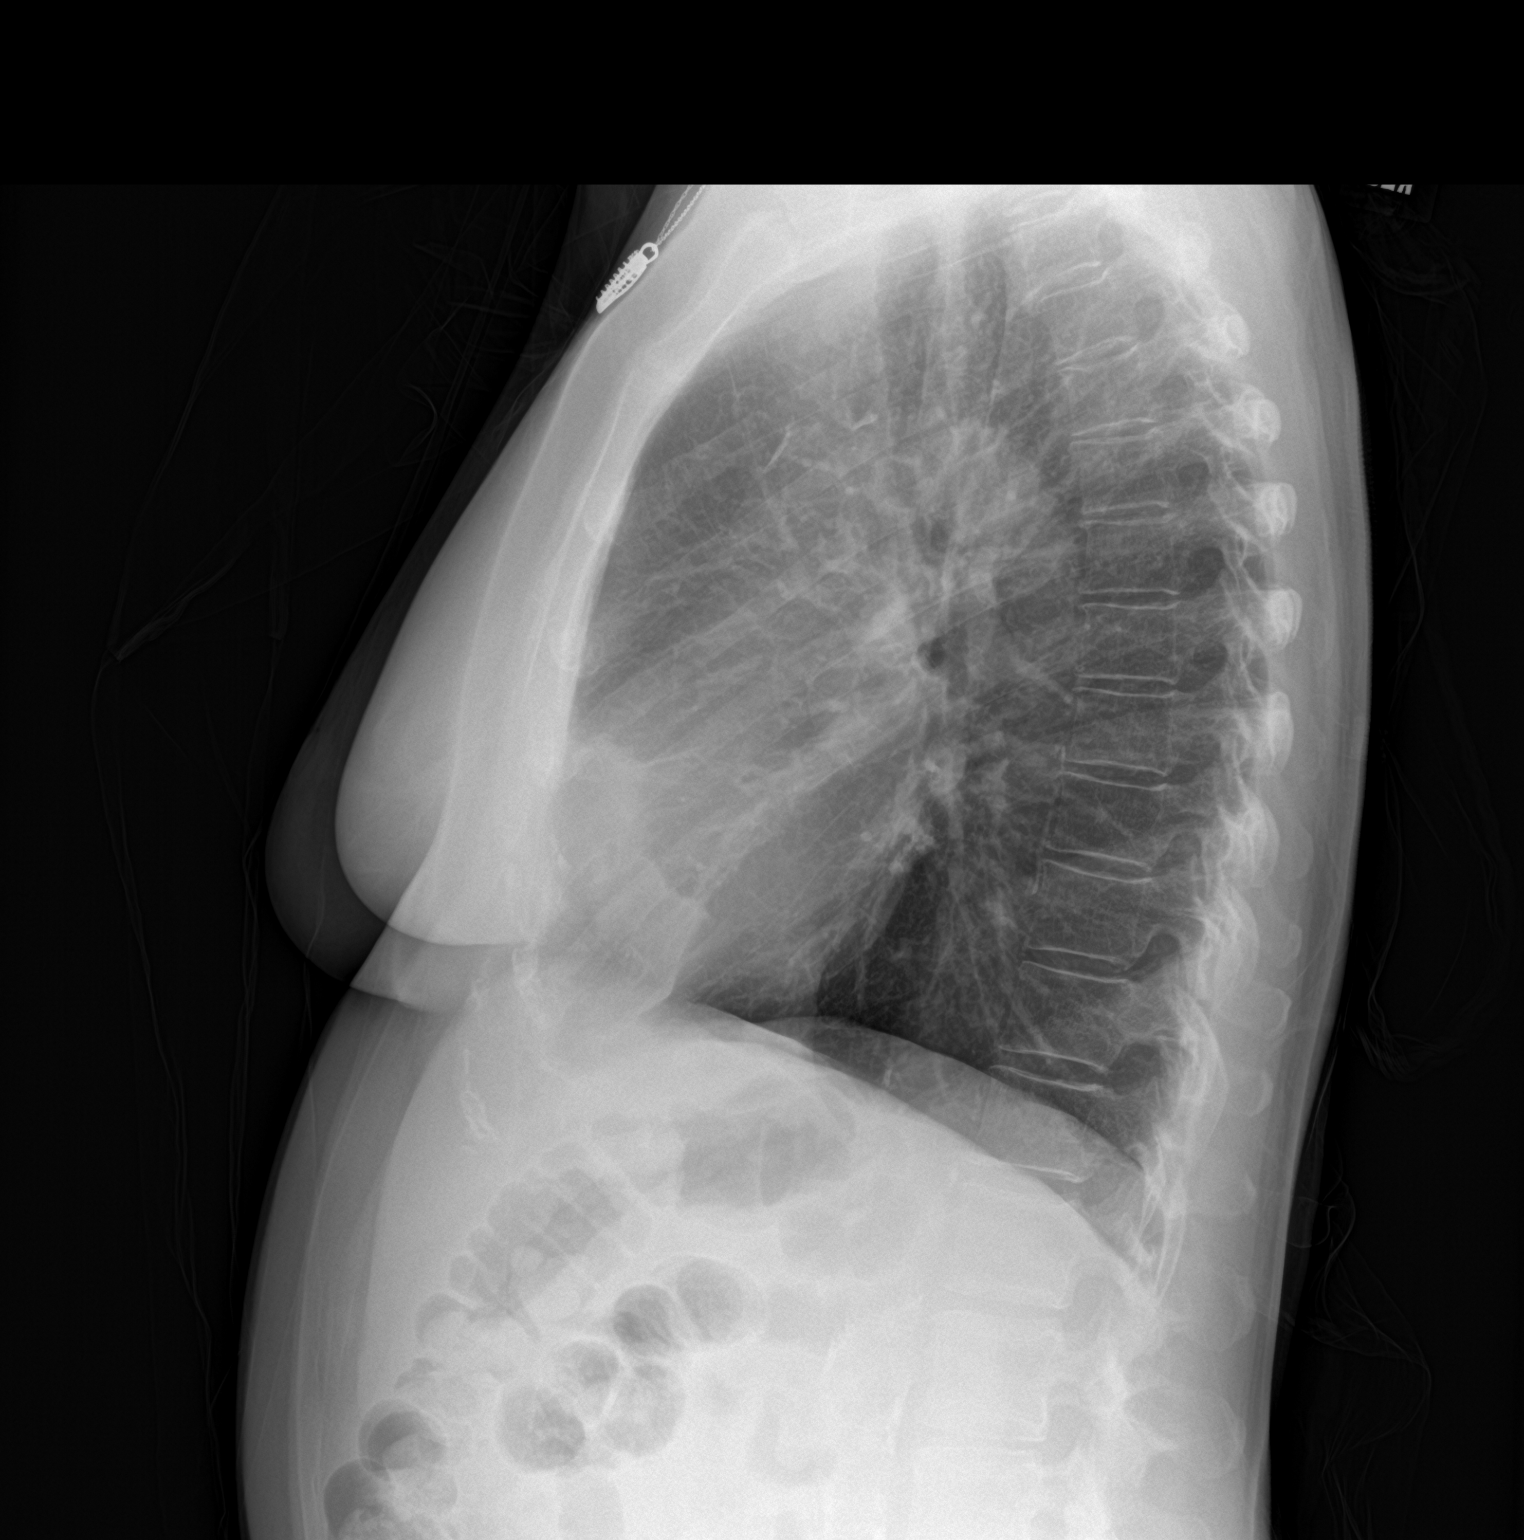

[2 of 2 positions shown; findings below may reference images not displayed]

FINDINGS: Trachea is midline. Heart size normal. Thoracic aorta is calcified.
Lungs are hyperinflated but clear. No pleural fluid.
IMPRESSION: Hyperinflation without acute finding.

## 2023-11-13 IMAGING — CR DG CHEST 2V
2 series · 2 of 2 positions shown · non-contrast
Comparison: November 23, 2021.

CLINICAL DATA: SHOB

EXAM:
CHEST - 2 VIEW

[w chest pa]
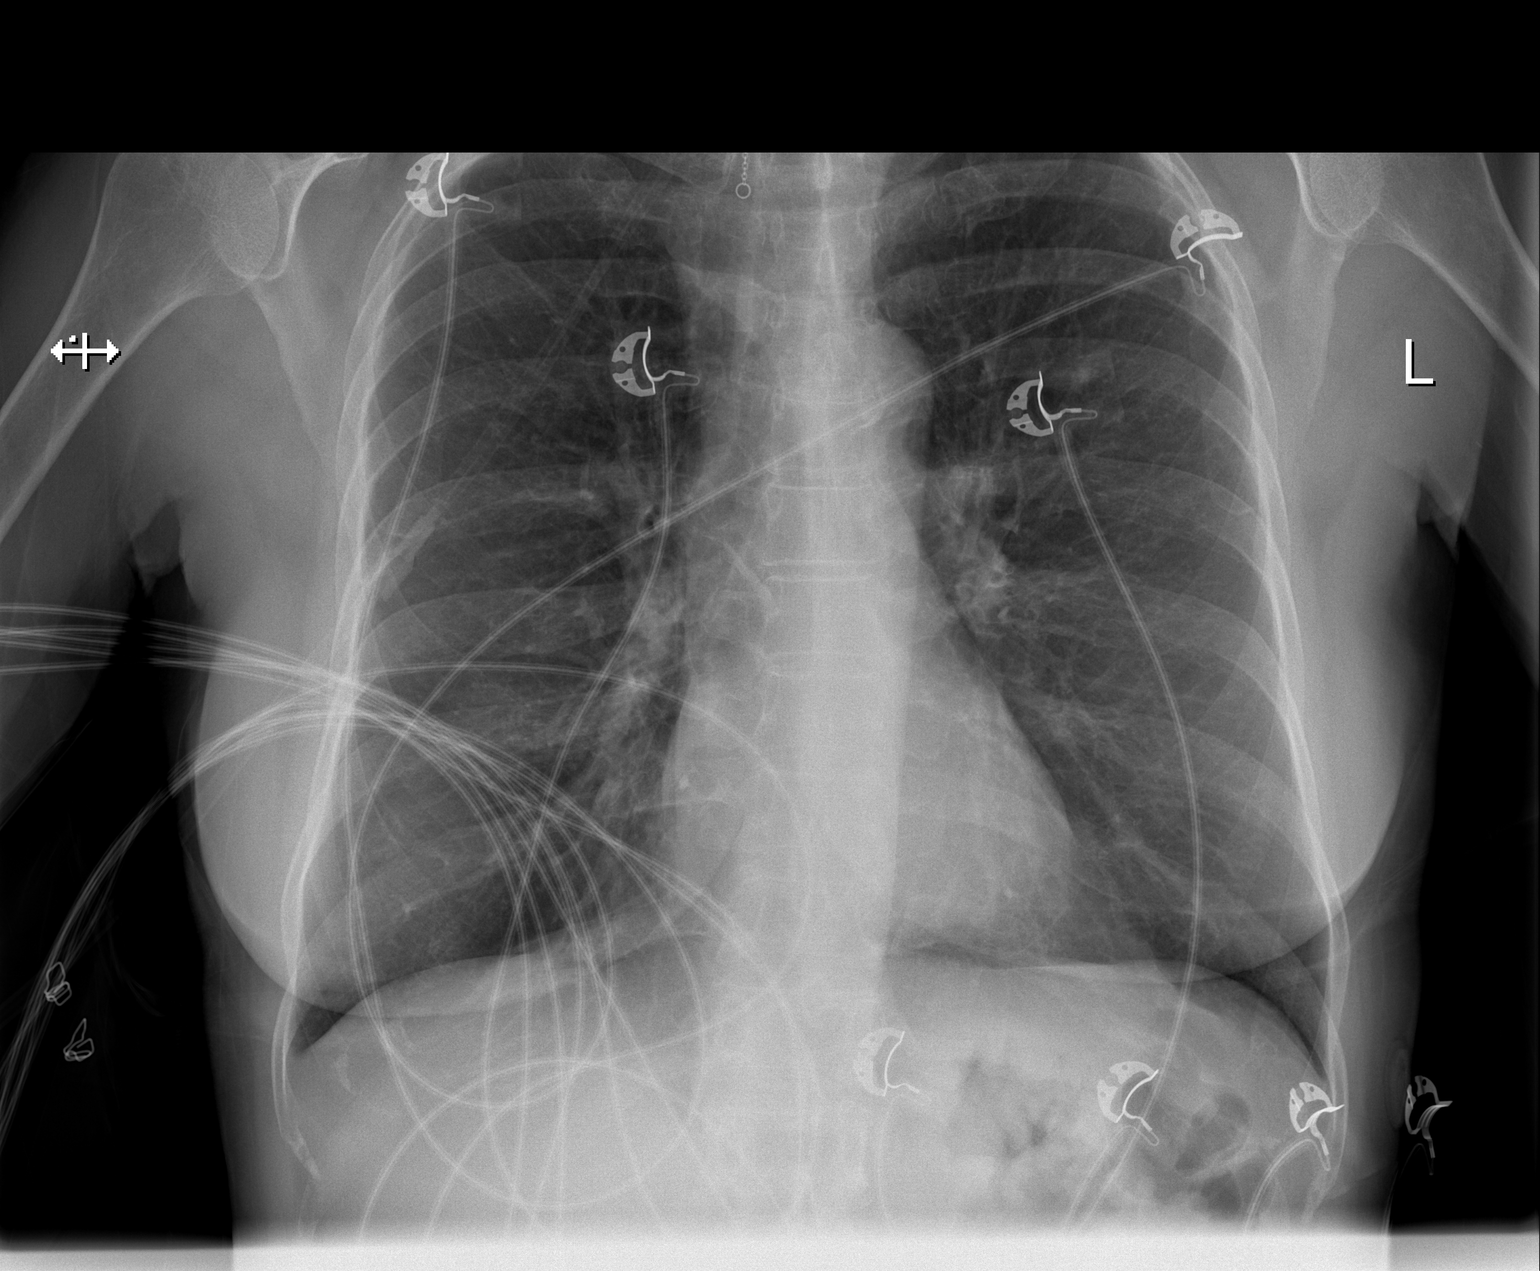

[w chest lat]
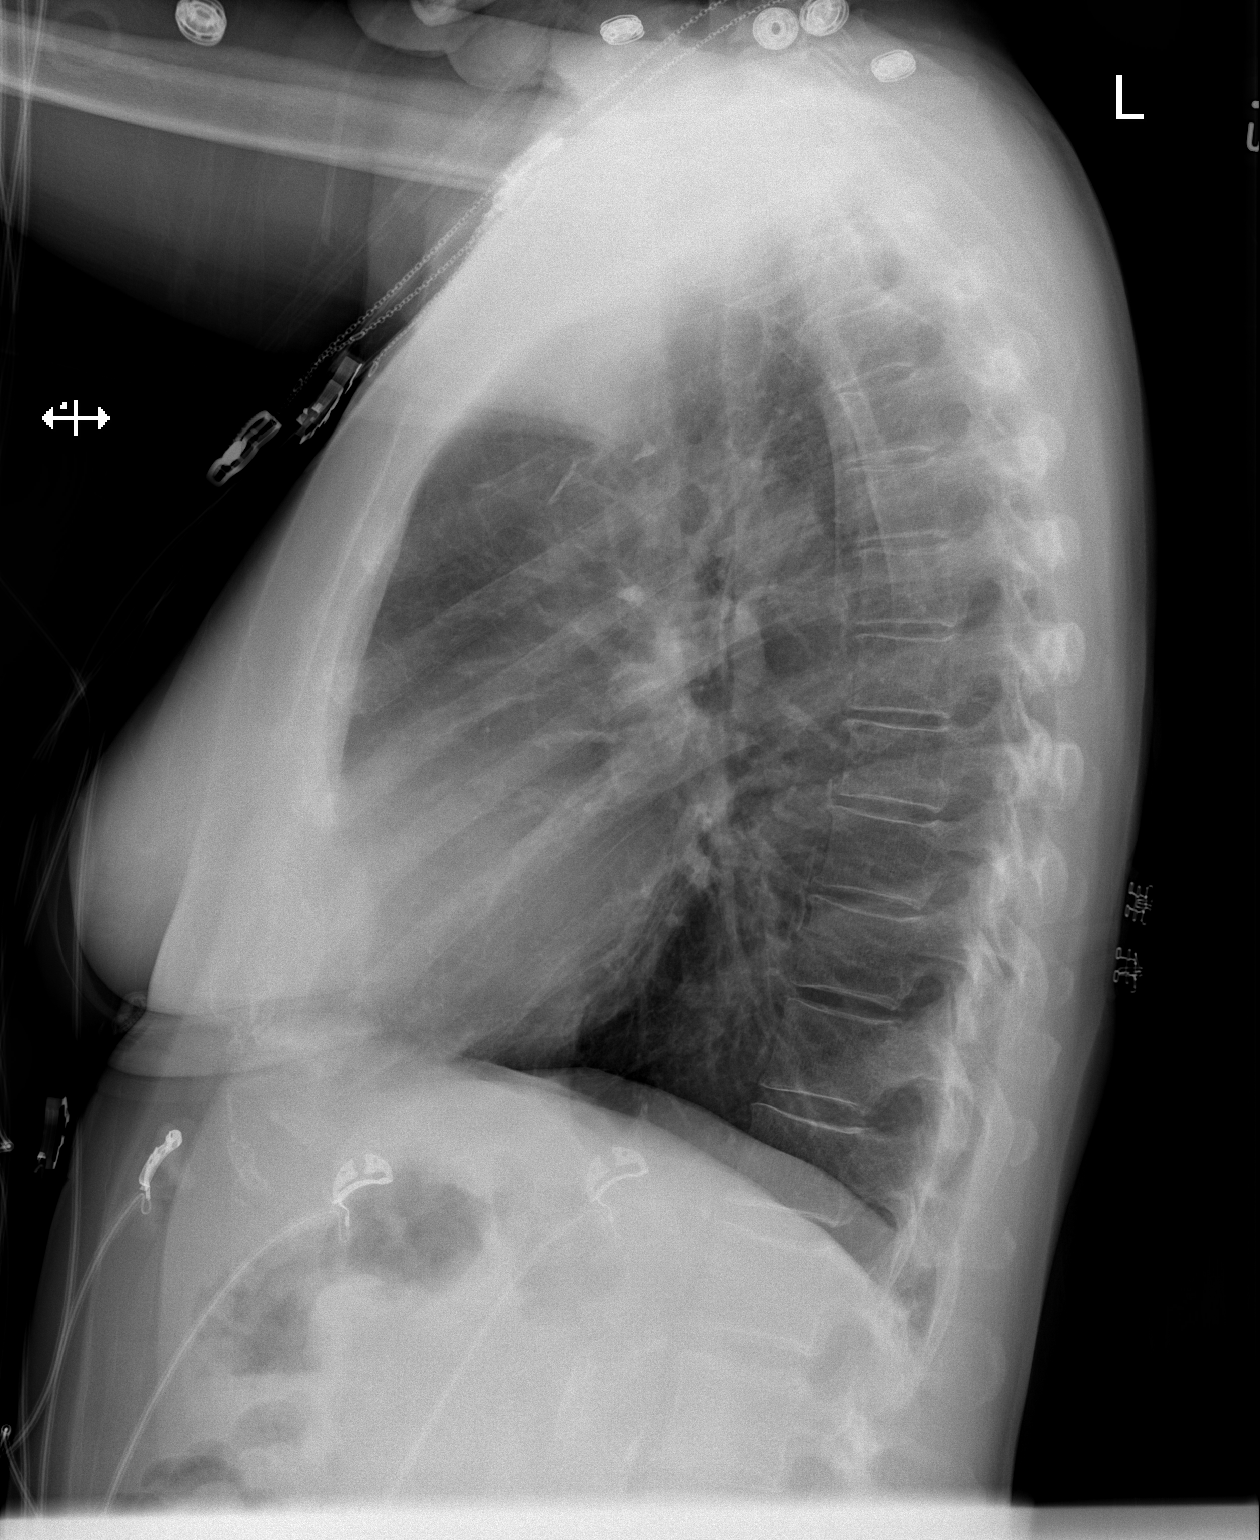

[2 of 2 positions shown; findings below may reference images not displayed]

FINDINGS: Chronic hyperinflation. No consolidation. No visible pleural
effusions or pneumothorax. Cardiomediastinal silhouette is within
normal limits and similar to prior.
IMPRESSION: Similar hyperinflation without evidence of acute cardiopulmonary
disease.

## 2024-04-30 ENCOUNTER — Emergency Department (HOSPITAL_COMMUNITY)

## 2024-04-30 ENCOUNTER — Other Ambulatory Visit: Payer: Self-pay

## 2024-04-30 ENCOUNTER — Emergency Department (HOSPITAL_COMMUNITY)
Admission: EM | Admit: 2024-04-30 | Discharge: 2024-05-01 | Disposition: A | Discharge status: Home / Self Care | Attending: Emergency Medicine | Admitting: Emergency Medicine

## 2024-04-30 DIAGNOSIS — J441 Chronic obstructive pulmonary disease with (acute) exacerbation: Secondary | ICD-10-CM | POA: Diagnosis not present

## 2024-04-30 DIAGNOSIS — I10 Essential (primary) hypertension: Secondary | ICD-10-CM | POA: Diagnosis not present

## 2024-04-30 DIAGNOSIS — R0602 Shortness of breath: Secondary | ICD-10-CM | POA: Diagnosis present

## 2024-04-30 DIAGNOSIS — E119 Type 2 diabetes mellitus without complications: Secondary | ICD-10-CM | POA: Diagnosis not present

## 2024-04-30 DIAGNOSIS — D72829 Elevated white blood cell count, unspecified: Secondary | ICD-10-CM | POA: Diagnosis not present

## 2024-04-30 DIAGNOSIS — Z79899 Other long term (current) drug therapy: Secondary | ICD-10-CM | POA: Insufficient documentation

## 2024-04-30 LAB — COMPREHENSIVE METABOLIC PANEL WITH GFR
ALT: 12 U/L (ref 0–44)
AST: 15 U/L (ref 15–41)
Albumin: 3.2 g/dL — ABNORMAL LOW (ref 3.5–5.0)
Alkaline Phosphatase: 77 U/L (ref 38–126)
Anion gap: 13 (ref 5–15)
BUN: 6 mg/dL — ABNORMAL LOW (ref 8–23)
CO2: 29 mmol/L (ref 22–32)
Calcium: 9 mg/dL (ref 8.9–10.3)
Chloride: 94 mmol/L — ABNORMAL LOW (ref 98–111)
Creatinine, Ser: 0.72 mg/dL (ref 0.44–1.00)
GFR, Estimated: >60 mL/min (ref >60)
Glucose, Bld: 406 mg/dL — ABNORMAL HIGH (ref 70–99)
Potassium: 4 mmol/L (ref 3.5–5.1)
Sodium: 136 mmol/L (ref 135–145)
Total Bilirubin: 0.2 mg/dL (ref 0.0–1.2)
Total Protein: 6.5 g/dL (ref 6.5–8.1)

## 2024-04-30 LAB — CBC
HCT: 42.9 % (ref 36.0–46.0)
Hemoglobin: 13.6 g/dL (ref 12.0–15.0)
MCH: 30.6 pg (ref 26.0–34.0)
MCHC: 31.7 g/dL (ref 30.0–36.0)
MCV: 96.4 fL (ref 80.0–100.0)
Platelets: 258 K/uL (ref 150–400)
RBC: 4.45 MIL/uL (ref 3.87–5.11)
RDW: 14.2 % (ref 11.5–15.5)
WBC: 21.4 K/uL — ABNORMAL HIGH (ref 4.0–10.5)
nRBC: 0 % (ref 0.0–0.2)

## 2024-04-30 LAB — RESP PANEL BY RT-PCR (RSV, FLU A&B, COVID)  RVPGX2
Influenza A by PCR: NEGATIVE
Influenza B by PCR: NEGATIVE
Resp Syncytial Virus by PCR: NEGATIVE
SARS Coronavirus 2 by RT PCR: NEGATIVE

## 2024-04-30 NOTE — ED Triage Notes (Signed)
 Patietn c/o SOB since June 27th , fever 101.4 at home took 500mg  x 2  tylenol  at 2000 and vomiting x3 today. Hurting on the right side of her head.

## 2024-04-30 NOTE — ED Notes (Signed)
 Save blue tube in main lab

## 2024-05-01 LAB — URINALYSIS, W/ REFLEX TO CULTURE (INFECTION SUSPECTED)
Bacteria, UA: NONE SEEN
Bilirubin Urine: NEGATIVE
Glucose, UA: >=500 mg/dL — AB
Hgb urine dipstick: NEGATIVE
Ketones, ur: NEGATIVE mg/dL
Leukocytes,Ua: NEGATIVE
Nitrite: NEGATIVE
Protein, ur: NEGATIVE mg/dL
Specific Gravity, Urine: 1.023 (ref 1.005–1.030)
pH: 6 (ref 5.0–8.0)

## 2024-05-01 LAB — D-DIMER, QUANTITATIVE: D-Dimer, Quant: <0.27 ug{FEU}/mL (ref 0.00–0.50)

## 2024-05-01 LAB — I-STAT CG4 LACTIC ACID, ED: Lactic Acid, Venous: 1.8 mmol/L (ref 0.5–1.9)

## 2024-05-01 MED ORDER — LEVOFLOXACIN 500 MG PO TABS: 500.0000 mg | ORAL_TABLET | Freq: Every day | ORAL | 0 refills | Status: AC

## 2024-05-01 MED ORDER — IPRATROPIUM BROMIDE 0.02 % IN SOLN
1.0000 mg | Freq: Once | RESPIRATORY_TRACT | Status: AC
  Administered 2024-05-01: 1 mg via RESPIRATORY_TRACT
  Filled 2024-05-01: qty 5

## 2024-05-01 MED ORDER — PREDNISONE 20 MG PO TABS: 40.0000 mg | ORAL_TABLET | Freq: Every day | ORAL | 0 refills | Status: AC

## 2024-05-01 MED ORDER — METHYLPREDNISOLONE SODIUM SUCC 125 MG IJ SOLR
125.0000 mg | Freq: Once | INTRAMUSCULAR | Status: AC
  Administered 2024-05-01: 125 mg via INTRAVENOUS
  Filled 2024-05-01: qty 2

## 2024-05-01 MED ORDER — ALBUTEROL SULFATE (2.5 MG/3ML) 0.083% IN NEBU
10.0000 mg/h | INHALATION_SOLUTION | RESPIRATORY_TRACT | Status: DC
  Administered 2024-05-01: 10 mg/h via RESPIRATORY_TRACT
  Filled 2024-05-01: qty 12

## 2024-05-01 MED ORDER — FLUCONAZOLE 150 MG PO TABS
150.0000 mg | ORAL_TABLET | Freq: Once | ORAL | 0 refills | Status: AC
Start: 2024-05-01 — End: 2024-05-01

## 2024-05-01 NOTE — ED Provider Notes (Signed)
 [place] EMERGENCY DEPARTMENT AT [facility] Provider Note  CSN: [id number] Arrival date & time: 04/30/24 2130  Chief Complaint(s) Shortness of Breath and Fever  HPI Patricia Kaiser is a 68 y.o. female with a past medical history listed below including COPD on as needed 3 L nasal cannula at home here for 1 week of increasing dyspnea on exertion and O2 demand stating that she feels like she needs oxygen continuously since.  She is endorsing cough.  States that she was treated for sinusitis last week with steroids and doxycycline  but feels like it is not helping and still has cough and sinus pressure.  Patient reports recent trip to the beach.  Reports that she had to cut her trip short because of the shortness of breath.  Also endorsing lower back discomfort and urinary frequency with nausea and nonbloody nonbilious emesis.  Also reported having a fever at home of 101.4 which improved after taking Tylenol .   The history is provided by the patient.    Past Medical History Past Medical History:  Diagnosis Date   Chest pain    2D ECHO, 08/25/2010 - EF->55%, normal   COPD (chronic obstructive pulmonary disease) (HCC)    DOE (dyspnea on exertion)    NUC, 08/25/2010 - low risk scan, normal   Hypertension    RENAL DOPPLER, 10/01/2010 - 1-59% diameter reduction of the R. renal artery   Non-insulin  dependent type 2 diabetes mellitus (HCC)    Shortness of breath    MET TEST, 06/26/2012   Tobacco abuse    Ureteral stent retained    Patient Active Problem List   Diagnosis Date Noted   Rhinovirus infection 10/15/2023   Mouth pain 10/13/2023   Leg cramp 10/13/2023   COPD with acute exacerbation (HCC) 04/08/2022   Acute on chronic respiratory failure with hypoxia (HCC) 12/10/2021   Uncontrolled type 2 diabetes mellitus with hyperglycemia, with long-term current use of insulin  (HCC) 12/10/2021   UTI (urinary tract infection) 05/18/2019   Sepsis secondary to UTI (HCC) 05/17/2019    Non-insulin  dependent type 2 diabetes mellitus (HCC)    Type 2 diabetes mellitus with hypoglycemia without coma (HCC)    Pyelonephritis    Type 2 diabetes mellitus without complication, with long-term current use of insulin  (HCC) 07/27/2016   COPD exacerbation (HCC) 10/22/2012   Hyperlipemia 10/22/2012   Tobacco use 10/22/2012   Hypokalemia 10/22/2012   Hypertension    COPD (chronic obstructive pulmonary disease) (HCC)    Home Medication(s) Prior to Admission medications   Medication Sig Start Date End Date Taking? Authorizing Provider  fluconazole  (DIFLUCAN ) 150 MG tablet Take 1 tablet (150 mg total) by mouth once for 1 dose. 05/01/24 05/01/24 Yes Pearley Millington, Raynell Moder, MD  levofloxacin  (LEVAQUIN ) 500 MG tablet Take 1 tablet (500 mg total) by mouth daily. 05/01/24  Yes Andretta Ergle, Raynell Moder, MD  predniSONE  (DELTASONE ) 20 MG tablet Take 2 tablets (40 mg total) by mouth daily with breakfast for 4 days. 05/01/24 05/05/24 Yes Ivori Storr, Raynell Moder, MD  acetaminophen  (TYLENOL ) 500 MG tablet Take 1,000 mg by mouth every 6 (six) hours as needed for pain. Patient not taking: Reported on 10/13/2023    [provider]  atorvastatin  (LIPITOR) 20 MG tablet TAKE 1 TABLET (20 MG TOTAL) BY MOUTH DAILY. Patient taking differently: Take 20 mg by mouth at bedtime.    Gladis, Mary-Margaret, FNP  benzonatate  (TESSALON  PERLES) 100 MG capsule Take 1 capsule (100 mg total) by mouth 3 (three) times daily as needed  for cough. 10/16/23 10/15/24  Tobie Yetta HERO, MD  BREO ELLIPTA  100-25 MCG/ACT AEPB Inhale 1 puff into the lungs daily. 09/22/23   [provider]  busPIRone  (BUSPAR ) 5 MG tablet Take 1 tablet (5 mg total) by mouth 3 (three) times daily. 10/16/23   Patel, Pranav M, MD  dapagliflozin propanediol (FARXIGA) 5 MG TABS tablet Take 5 mg by mouth at bedtime. 05/26/23   [provider]  dextromethorphan  (DELSYM ) 30 MG/5ML liquid Take 5 mLs (30 mg total) by mouth 2 (two) times daily.  10/16/23   Tobie Yetta HERO, MD  guaiFENesin  (MUCINEX ) 600 MG 12 hr tablet Take 1 tablet (600 mg total) by mouth 2 (two) times daily. 10/16/23 10/15/24  Tobie Yetta HERO, MD  HYDROcodone  bit-homatropine [facility]) 5-1.5 MG/5ML syrup Take 5 mLs by mouth every 6 (six) hours as needed for cough. 10/16/23   Tobie Yetta HERO, MD  ipratropium-albuterol  (DUONEB) 0.5-2.5 (3) MG/3ML SOLN Take 3 mLs by nebulization every 4 (four) hours as needed. 10/16/23   Tobie Yetta HERO, MD  nystatin  (MYCOSTATIN ) 100000 UNIT/ML suspension Take 5 mLs (500,000 Units total) by mouth 4 (four) times daily. 10/16/23   Tobie Yetta HERO, MD  OXYGEN Inhale 2.5-5 L into the lungs See admin instructions. At bedtime And as needed for shortness of breath    [provider]  PREMARIN  1.25 MG tablet TAKE 1 TABLET (1.25 MG TOTAL) BY MOUTH DAILY. Patient taking differently: Take 1.25 mg by mouth at bedtime. 09/20/14   Gladis, Mary-Margaret, FNP  SEMGLEE , YFGN, 100 UNIT/ML Pen Inject 30 Units into the skin in the morning. 10/16/23   Tobie Yetta HERO, MD  umeclidinium bromide  (INCRUSE ELLIPTA ) 62.5 MCG/ACT AEPB Inhale 1 puff into the lungs daily. 11/24/22   [provider]                                                                                                                                    Allergies Cefdinir, Sulfa antibiotics, Codeine, Erythromycin, Gabapentin , Metformin  and related, and Symbicort  [budesonide -formoterol  fumarate]  Review of Systems Review of Systems As noted in HPI  Physical Exam Vital Signs  I have reviewed the triage vital signs BP (!) 120/55 (BP Location: Left Arm)   Pulse 94   Temp 99 F (37.2 C) (Oral)   Resp 20   SpO2 97%   Physical Exam Vitals reviewed.  Constitutional:      General: She is not in acute distress.    Appearance: She is well-developed. She is not diaphoretic.  HENT:     Head: Normocephalic and atraumatic.     Nose: Nose normal.  Eyes:     General: No scleral  icterus.       Right eye: No discharge.        Left eye: No discharge.     Conjunctiva/sclera: Conjunctivae normal.     Pupils: Pupils are equal, round, and reactive to light.  Cardiovascular:     Rate and Rhythm: Normal rate and regular rhythm.     Heart sounds: No murmur heard.    No friction rub. No gallop.  Pulmonary:     Effort: Pulmonary effort is normal. No respiratory distress.     Breath sounds: Decreased air movement present. No stridor. Wheezing present. No rales.  Abdominal:     General: There is no distension.     Palpations: Abdomen is soft.     Tenderness: There is no abdominal tenderness.  Musculoskeletal:        General: No tenderness.     Cervical back: Normal range of motion and neck supple.  Skin:    General: Skin is warm and dry.     Findings: No erythema or rash.  Neurological:     Mental Status: She is alert and oriented to person, place, and time.     ED Results and Treatments Labs (all labs ordered are listed, but only abnormal results are displayed) Labs Reviewed  COMPREHENSIVE METABOLIC PANEL WITH GFR - Abnormal; Notable for the following components:      Result Value   Chloride 94 (*)    Glucose, Bld 406 (*)    BUN 6 (*)    Albumin 3.2 (*)    All other components within normal limits  CBC - Abnormal; Notable for the following components:   WBC 21.4 (*)    All other components within normal limits  URINALYSIS, W/ REFLEX TO CULTURE (INFECTION SUSPECTED) - Abnormal; Notable for the following components:   Glucose, UA >=500 (*)    All other components within normal limits  RESP PANEL BY RT-PCR (RSV, FLU A&B, COVID)  RVPGX2  D-DIMER, QUANTITATIVE  I-STAT CG4 LACTIC ACID, ED                                                                                                                         EKG  EKG Interpretation Date/Time:  Monday April 30 2024 22:04:21 EDT Ventricular Rate:  99 PR Interval:  110 QRS Duration:  77 QT Interval:  320 QTC  Calculation: 411 R Axis:   36  Text Interpretation: Sinus rhythm Right atrial enlargement Confirmed by Trine Likes [phone]) on 05/01/2024 12:38:42 AM       Radiology DG Chest Port 1 View Result Date: 04/30/2024 CLINICAL DATA:  Shortness of breath EXAM: PORTABLE CHEST 1 VIEW COMPARISON:  Chest x-ray 10/13/2023 FINDINGS: The heart size and mediastinal contours are within normal there are minimal patchy opacities in the right costophrenic angle. The lungs are otherwise clear. There is no pleural effusion or pneumothorax. Both lungs are clear. The visualized skeletal structures are unremarkable. IMPRESSION: Minimal patchy opacities in the right costophrenic angle, likely atelectasis. Electronically Signed   By: Greig Pique M.D.   On: 04/30/2024 22:28    Medications Ordered in ED Medications  albuterol  (PROVENTIL ) (2.5 MG/3ML) 0.083% nebulizer solution (0 mg/hr Nebulization Stopped 05/01/24 0414)  methylPREDNISolone  sodium succinate (SOLU-MEDROL ) 125  mg/2 mL injection 125 mg (125 mg Intravenous Given 05/01/24 0142)  ipratropium (ATROVENT ) nebulizer solution 1 mg (1 mg Nebulization Given 05/01/24 0143)   Procedures .Critical Care  Performed by: Trine Raynell Moder, MD Authorized by: Trine Raynell Moder, MD   Critical care provider statement:    Critical care time (minutes):  45   Critical care time was exclusive of:  Separately billable procedures and treating other patients   Critical care was necessary to treat or prevent imminent or life-threatening deterioration of the following conditions:  Respiratory failure   Critical care was time spent personally by me on the following activities:  Development of treatment plan with patient or surrogate, discussions with consultants, evaluation of patient's response to treatment, examination of patient, obtaining history from patient or surrogate, review of old charts, re-evaluation of patient's condition, pulse oximetry, ordering and review of  radiographic studies, ordering and review of laboratory studies and ordering and performing treatments and interventions   Care discussed with: admitting provider     (including critical care time) Medical Decision Making / ED Course   Medical Decision Making Amount and/or Complexity of Data Reviewed Labs: ordered. Decision-making details documented in ED Course. Radiology: ordered. Decision-making details documented in ED Course. ECG/medicine tests:  Decision-making details documented in ED Course.  Risk Prescription drug management.     Clinical Course as of 05/01/24 0734  Tue May 01, 2024  0115 Fever and shortness of breath is worsening dyspnea on exertion. Differential diagnosis considered.   [PC]  0121 1 WEEK OF PREDNISONE  [PC]  0208 CBC(!) Leukocytosis may be from infection versus recent prednisone  use.  No anemia. [PC]  0208 Comprehensive metabolic panel(!) No significant electrolyte derangements.  Hyperglycemia without evidence of DKA.  No renal insufficiency.  No biliary obstruction. [PC]  0208 Urinalysis, w/ Reflex to Culture (Infection Suspected) -Urine, Clean Catch(!) No evidence of infection. [PC]  0208 I-Stat CG4 Lactic Acid Normal and not concerning for severe sepsis. [PC]  0209 D-dimer, quantitative Negative.  Doubt pulmonary embolism [PC]  0209 Resp panel by RT-PCR (RSV, Flu A&B, Covid) Anterior Nasal Swab To assess for COVID, influenza, RSV is negative. [PC]  0209 DG Chest Port 1 View No evidence of pneumonia, pneumothorax, pulmonary edema pleural effusion.  She does have mild atelectasis. [PC]  0210 EKG 12-Lead No acute ischemic changes, dysrhythmias or blocks. [PC]    Clinical Course User Index [PC] Markevius Trombetta, Raynell Moder, MD    Patient provided with DuoNeb's and Solu-Medrol .  On reassessment she feels better.  Ambulated and maintain sats above 88%.  Feels well enough to go home with additional steroids and antibiotics.  Final Clinical Impression(s)  / ED Diagnoses Final diagnoses:  COPD with acute exacerbation (HCC)   The patient appears reasonably screened and/or stabilized for discharge and I doubt any other medical condition or other [facility] requiring further screening, evaluation, or treatment in the ED at this time. I have discussed the findings, Dx and Tx plan with the patient/family who expressed understanding and agree(s) with the plan. Discharge instructions discussed at length. The patient/family was given strict return precautions who verbalized understanding of the instructions. No further questions at time of discharge.  Disposition: Discharge  Condition: Good  ED Discharge Orders          Ordered    predniSONE  (DELTASONE ) 20 MG tablet  Daily with breakfast        05/01/24 0348    fluconazole  (DIFLUCAN ) 150 MG tablet   Once  05/01/24 0348    levofloxacin  (LEVAQUIN ) 500 MG tablet  Daily        05/01/24 0348             Follow Up: Baird Comer GAILS, NP [address] [place] [address] [phone]  Call  to schedule an appointment for close follow up    This chart was dictated using voice recognition software.  Despite best efforts to proofread,  errors can occur which can change the documentation meaning.    Trine Raynell Moder, MD 05/01/24 [phone]

## 2024-05-01 NOTE — ED Notes (Addendum)
 Pt O2 dropped to 88% while ambulating to restroom.

## 2024-07-15 ENCOUNTER — Emergency Department (HOSPITAL_BASED_OUTPATIENT_CLINIC_OR_DEPARTMENT_OTHER)
Admission: EM | Admit: 2024-07-15 | Discharge: 2024-07-15 | Disposition: A | Discharge status: Home / Self Care | Attending: Emergency Medicine | Admitting: Emergency Medicine

## 2024-07-15 ENCOUNTER — Emergency Department (HOSPITAL_BASED_OUTPATIENT_CLINIC_OR_DEPARTMENT_OTHER)

## 2024-07-15 ENCOUNTER — Encounter (HOSPITAL_BASED_OUTPATIENT_CLINIC_OR_DEPARTMENT_OTHER): Payer: Self-pay

## 2024-07-15 ENCOUNTER — Other Ambulatory Visit: Payer: Self-pay

## 2024-07-15 DIAGNOSIS — J449 Chronic obstructive pulmonary disease, unspecified: Secondary | ICD-10-CM | POA: Diagnosis not present

## 2024-07-15 DIAGNOSIS — E119 Type 2 diabetes mellitus without complications: Secondary | ICD-10-CM | POA: Diagnosis not present

## 2024-07-15 DIAGNOSIS — I10 Essential (primary) hypertension: Secondary | ICD-10-CM | POA: Diagnosis not present

## 2024-07-15 DIAGNOSIS — R1013 Epigastric pain: Secondary | ICD-10-CM | POA: Diagnosis present

## 2024-07-15 LAB — URINALYSIS, ROUTINE W REFLEX MICROSCOPIC
Bilirubin Urine: NEGATIVE
Glucose, UA: >=500 mg/dL — AB
Ketones, ur: NEGATIVE mg/dL
Leukocytes,Ua: NEGATIVE
Nitrite: NEGATIVE
Protein, ur: NEGATIVE mg/dL
Specific Gravity, Urine: 1.01 (ref 1.005–1.030)
pH: 6.5 (ref 5.0–8.0)

## 2024-07-15 LAB — URINALYSIS, MICROSCOPIC (REFLEX)

## 2024-07-15 LAB — COMPREHENSIVE METABOLIC PANEL WITH GFR
ALT: 8 U/L (ref 0–44)
AST: 14 U/L — ABNORMAL LOW (ref 15–41)
Albumin: 4 g/dL (ref 3.5–5.0)
Alkaline Phosphatase: 100 U/L (ref 38–126)
Anion gap: 11 (ref 5–15)
BUN: 7 mg/dL — ABNORMAL LOW (ref 8–23)
CO2: 27 mmol/L (ref 22–32)
Calcium: 8.9 mg/dL (ref 8.9–10.3)
Chloride: 101 mmol/L (ref 98–111)
Creatinine, Ser: 0.75 mg/dL (ref 0.44–1.00)
GFR, Estimated: >60 mL/min (ref >60)
Glucose, Bld: 378 mg/dL — ABNORMAL HIGH (ref 70–99)
Potassium: 4.6 mmol/L (ref 3.5–5.1)
Sodium: 139 mmol/L (ref 135–145)
Total Bilirubin: <0.2 mg/dL (ref 0.0–1.2)
Total Protein: 6.7 g/dL (ref 6.5–8.1)

## 2024-07-15 LAB — CBC
HCT: 40.9 % (ref 36.0–46.0)
Hemoglobin: 13.1 g/dL (ref 12.0–15.0)
MCH: 30 pg (ref 26.0–34.0)
MCHC: 32 g/dL (ref 30.0–36.0)
MCV: 93.8 fL (ref 80.0–100.0)
Platelets: 389 K/uL (ref 150–400)
RBC: 4.36 MIL/uL (ref 3.87–5.11)
RDW: 12.4 % (ref 11.5–15.5)
WBC: 9.6 K/uL (ref 4.0–10.5)
nRBC: 0 % (ref 0.0–0.2)

## 2024-07-15 LAB — LIPASE, BLOOD: Lipase: 19 U/L (ref 11–51)

## 2024-07-15 MED ORDER — METOCLOPRAMIDE HCL 10 MG PO TABS: 5.0000 mg | ORAL_TABLET | Freq: Three times a day (TID) | ORAL | 0 refills | Status: AC | PRN

## 2024-07-15 MED ORDER — IOHEXOL 300 MG/ML  SOLN
75.0000 mL | Freq: Once | INTRAMUSCULAR | Status: AC | PRN
  Administered 2024-07-15: 75 mL via INTRAVENOUS

## 2024-07-15 MED ORDER — METOCLOPRAMIDE HCL 5 MG/ML IJ SOLN
10.0000 mg | Freq: Once | INTRAMUSCULAR | Status: AC
  Administered 2024-07-15: 10 mg via INTRAVENOUS
  Filled 2024-07-15: qty 2

## 2024-07-15 NOTE — Discharge Instructions (Addendum)
 Your workup was reassuring today.  Your kidney, liver, and pancreas labs are normal.  Your blood counts electrolytes are normal.  Your urine does not show any signs of infection.  Your CT scan did not show any acute abnormality to explain your pain.  There were couple incidental findings noted on your CT scan including a right sided kidney stone, scarring on your kidney, and fat in your liver.  I have included your CT scan results below for you to make your PCP aware of.  Your blood sugar was elevated today.  This means your diabetes is not well-controlled.  Please follow-up with your PCP within the next month for further management of your blood sugars.  I suspect that your abdominal pain could be secondary to fluid stomach emptying (gastroparesis), stomach ulcer, or other GI pathology.  Please follow-up with the GI provider listed below soon as possible for further evaluation, or have your PCP send a referral for you to GI.  You have been prescribed metoclopramide (Reglan) which is a medication that can help with stomach emptying and nausea.  You may take 0.5 tablet (5mg ) up to 3 times daily before a meal as needed for nausea or stomach pain.  If you do not find this helpful, please do not continue taking this medication. If you develop any repetitive motions such as lip smacking, hand motions, discontinue the medication immediately and seek medical care.  Please return to the ER for any severe worsening pain, unexplained fevers, persistent vomiting, any other new or concerning symptoms   IMPRESSION:  1. Hepatic steatosis.  2. 1 mm nonobstructing right renal calculus.  3. 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  4. Aortic atherosclerosis.

## 2024-07-15 NOTE — ED Triage Notes (Signed)
 Abdominal pain since June. Upper Abdominal pain x 14 days. Small BM's daily except yesterday . Drank a bottle of mag citrate yesterday and had a BM. Pain is still constant , but sharp pain are not as frequent

## 2024-07-15 NOTE — ED Provider Notes (Signed)
 [facility] Provider Note   CSN: [id number] Arrival date & time: 07/15/24  1532     Patient presents with: Abdominal Pain   Patricia Kaiser is a 68 y.o. female with history of COPD, hypertension, type 2 diabetes, presents with concern for epigastric abdominal pain that has been ongoing for about 3 months.  She reports that she has had decreased appetite.  Reports a constant discomfort in the epigastric region, but pain is worse when eating.  She also reports sometimes she will have some vomiting as well.  She reports normal bowel movements.  Denies any dysuria, hematuria, increased frequency.  No chest pain or shortness of breath.  {Add pertinent medical, surgical, social history, OB history to HPI:32947}  Abdominal Pain      Prior to Admission medications   Medication Sig Start Date End Date Taking? Authorizing Provider  acetaminophen  (TYLENOL ) 500 MG tablet Take 1,000 mg by mouth every 6 (six) hours as needed for pain. Patient not taking: Reported on 10/13/2023    [provider]  atorvastatin  (LIPITOR) 20 MG tablet TAKE 1 TABLET (20 MG TOTAL) BY MOUTH DAILY. Patient taking differently: Take 20 mg by mouth at bedtime.    Gladis, Mary-Margaret, FNP  benzonatate  (TESSALON  PERLES) 100 MG capsule Take 1 capsule (100 mg total) by mouth 3 (three) times daily as needed for cough. 10/16/23 10/15/24  Tobie Yetta HERO, MD  BREO ELLIPTA  100-25 MCG/ACT AEPB Inhale 1 puff into the lungs daily. 09/22/23   [provider]  busPIRone  (BUSPAR ) 5 MG tablet Take 1 tablet (5 mg total) by mouth 3 (three) times daily. 10/16/23   Patel, Pranav M, MD  dapagliflozin propanediol (FARXIGA) 5 MG TABS tablet Take 5 mg by mouth at bedtime. 05/26/23   [provider]  dextromethorphan  (DELSYM ) 30 MG/5ML liquid Take 5 mLs (30 mg total) by mouth 2 (two) times daily. 10/16/23   Tobie Yetta HERO, MD  guaiFENesin  (MUCINEX ) 600 MG 12 hr tablet Take 1  tablet (600 mg total) by mouth 2 (two) times daily. 10/16/23 10/15/24  Tobie Yetta HERO, MD  HYDROcodone  bit-homatropine [facility] Xxx) 5-1.5 MG/5ML syrup Take 5 mLs by mouth every 6 (six) hours as needed for cough. 10/16/23   Tobie Yetta HERO, MD  ipratropium-albuterol  (DUONEB) 0.5-2.5 (3) MG/3ML SOLN Take 3 mLs by nebulization every 4 (four) hours as needed. 10/16/23   Tobie Yetta HERO, MD  levofloxacin  (LEVAQUIN ) 500 MG tablet Take 1 tablet (500 mg total) by mouth daily. 05/01/24   Cardama, Raynell Moder, MD  nystatin  (MYCOSTATIN ) 100000 UNIT/ML suspension Take 5 mLs (500,000 Units total) by mouth 4 (four) times daily. 10/16/23   Tobie Yetta HERO, MD  OXYGEN Inhale 2.5-5 L into the lungs See admin instructions. At bedtime And as needed for shortness of breath    [provider]  PREMARIN  1.25 MG tablet TAKE 1 TABLET (1.25 MG TOTAL) BY MOUTH DAILY. Patient taking differently: Take 1.25 mg by mouth at bedtime. 09/20/14   Martin, Mary-Margaret, FNP  SEMGLEE , YFGN, 100 UNIT/ML Pen Inject 30 Units into the skin in the morning. 10/16/23   Tobie Yetta HERO, MD  umeclidinium bromide  (INCRUSE ELLIPTA ) 62.5 MCG/ACT AEPB Inhale 1 puff into the lungs daily. 11/24/22   [provider]    Allergies: Cefdinir, Sulfa antibiotics, Codeine, Erythromycin, Gabapentin , Metformin  and related, and Symbicort  [budesonide -formoterol  fumarate]    Review of Systems  Gastrointestinal:  Positive for abdominal pain.    Updated Vital Signs BP (!) 186/78 (  BP Location: Left Arm)   Pulse 84   Temp 98.2 F (36.8 C) (Oral)   Resp (!) 28   Wt 49.9 kg   SpO2 97%   BMI 20.78 kg/m   Physical Exam  (all labs ordered are listed, but only abnormal results are displayed) Labs Reviewed  COMPREHENSIVE METABOLIC PANEL WITH GFR - Abnormal; Notable for the following components:      Result Value   Glucose, Bld 378 (*)    BUN 7 (*)    AST 14 (*)    All other components within normal limits  URINALYSIS, ROUTINE W REFLEX  MICROSCOPIC - Abnormal; Notable for the following components:   Glucose, UA >=500 (*)    Hgb urine dipstick TRACE (*)    All other components within normal limits  URINALYSIS, MICROSCOPIC (REFLEX) - Abnormal; Notable for the following components:   Bacteria, UA RARE (*)    All other components within normal limits  LIPASE, BLOOD  CBC    EKG: None  Radiology: CT ABDOMEN PELVIS W CONTRAST Result Date: 07/15/2024 CLINICAL DATA:  Upper abdominal pain x 14 days. EXAM: CT ABDOMEN AND PELVIS WITH CONTRAST TECHNIQUE: Multidetector CT imaging of the abdomen and pelvis was performed using the standard protocol following bolus administration of intravenous contrast. RADIATION DOSE REDUCTION: This exam was performed according to the departmental dose-optimization program which includes automated exposure control, adjustment of the mA and/or kV according to patient size and/or use of iterative reconstruction technique. CONTRAST:  75mL OMNIPAQUE  IOHEXOL  300 MG/ML  SOLN COMPARISON:  May 17, 2019 FINDINGS: Lower chest: No acute abnormality. Hepatobiliary: There is diffuse fatty infiltration of the liver parenchyma. No focal liver abnormality is seen. The gallbladder is contracted without evidence of gallstones, gallbladder wall thickening, or biliary dilatation. Pancreas: Unremarkable. No pancreatic ductal dilatation or surrounding inflammatory changes. Spleen: Normal in size without focal abnormality. Adrenals/Urinary Tract: Adrenal glands are unremarkable. Kidneys are normal in size, without obstructing renal calculi or hydronephrosis. Cortical scarring is again seen along the posterior aspect of the mid to upper left kidney. A stable adjacent 1 mm calcification and small, simple left renal cyst are noted. There is a 1 mm nonobstructing renal calculus seen within the mid right kidney. Bladder is unremarkable. Stomach/Bowel: Stomach is within normal limits. Appendix appears normal. No evidence of bowel wall  thickening, distention, or inflammatory changes. Vascular/Lymphatic: Aortic atherosclerosis. No enlarged abdominal or pelvic lymph nodes. Reproductive: Status post hysterectomy. No adnexal masses. Other: No abdominal wall hernia or abnormality. No abdominopelvic ascites. Musculoskeletal: No acute or significant osseous findings. IMPRESSION: 1. Hepatic steatosis. 2. 1 mm nonobstructing right renal calculus. 3. Stable chronic renal cortical scarring along the posterior aspect of the mid to upper left kidney. 4. Aortic atherosclerosis. Electronically Signed   By: Suzen Dials M.D.   On: 07/15/2024 18:22    {Document cardiac monitor, telemetry assessment procedure when appropriate:32947} Procedures   Medications Ordered in the ED  iohexol  (OMNIPAQUE ) 300 MG/ML solution 75 mL (75 mLs Intravenous Contrast Given 07/15/24 1734)  metoCLOPramide (REGLAN) injection 10 mg (10 mg Intravenous Given 07/15/24 2017)      {Click here for ABCD2, HEART and other calculators REFRESH Note before signing:1}                              Medical Decision Making Amount and/or Complexity of Data Reviewed Labs: ordered. Radiology: ordered.  Risk Prescription drug management.   ***  {Document critical  care time when appropriate  Document review of labs and clinical decision tools ie CHADS2VASC2, etc  Document your independent review of radiology images and any outside records  Document your discussion with family members, caretakers and with consultants  Document social determinants of health affecting pt's care  Document your decision making why or why not admission, treatments were needed:32947:::1}   Final diagnoses:  None    ED Discharge Orders     None
